# Patient Record
Sex: Male | Born: 1965 | Race: Black or African American | Hispanic: No | Marital: Married | Smoking: Never smoker
Health system: Southern US, Community
[De-identification: ages and names within clinical notes are randomized; demographics above are authoritative.]

## PROBLEM LIST (undated history)

## (undated) DIAGNOSIS — N189 Chronic kidney disease, unspecified: Secondary | ICD-10-CM

## (undated) DIAGNOSIS — I493 Ventricular premature depolarization: Secondary | ICD-10-CM

## (undated) DIAGNOSIS — M109 Gout, unspecified: Secondary | ICD-10-CM

## (undated) DIAGNOSIS — D649 Anemia, unspecified: Secondary | ICD-10-CM

## (undated) DIAGNOSIS — M1990 Unspecified osteoarthritis, unspecified site: Secondary | ICD-10-CM

## (undated) DIAGNOSIS — E213 Hyperparathyroidism, unspecified: Secondary | ICD-10-CM

## (undated) DIAGNOSIS — E119 Type 2 diabetes mellitus without complications: Secondary | ICD-10-CM

## (undated) DIAGNOSIS — Z862 Personal history of diseases of the blood and blood-forming organs and certain disorders involving the immune mechanism: Secondary | ICD-10-CM

## (undated) DIAGNOSIS — E669 Obesity, unspecified: Secondary | ICD-10-CM

## (undated) DIAGNOSIS — R0981 Nasal congestion: Secondary | ICD-10-CM

## (undated) DIAGNOSIS — I10 Essential (primary) hypertension: Secondary | ICD-10-CM

## (undated) DIAGNOSIS — I509 Heart failure, unspecified: Secondary | ICD-10-CM

## (undated) DIAGNOSIS — G4730 Sleep apnea, unspecified: Secondary | ICD-10-CM

## (undated) HISTORY — DX: Hyperparathyroidism, unspecified: E21.3

## (undated) HISTORY — DX: Chronic kidney disease, unspecified: N18.9

## (undated) HISTORY — DX: Ventricular premature depolarization: I49.3

## (undated) HISTORY — DX: Personal history of diseases of the blood and blood-forming organs and certain disorders involving the immune mechanism: Z86.2

## (undated) HISTORY — PX: NASAL SEPTUM SURGERY: SHX37

---

## 2006-06-15 ENCOUNTER — Encounter: Admission: RE | Admit: 2006-06-15 | Discharge: 2006-06-15 | Payer: Self-pay | Admitting: Cardiology

## 2006-07-13 ENCOUNTER — Ambulatory Visit (HOSPITAL_BASED_OUTPATIENT_CLINIC_OR_DEPARTMENT_OTHER): Admission: RE | Admit: 2006-07-13 | Discharge: 2006-07-13 | Payer: Self-pay | Admitting: Urology

## 2009-05-15 ENCOUNTER — Emergency Department (HOSPITAL_COMMUNITY): Admission: EM | Admit: 2009-05-15 | Discharge: 2009-05-15 | Payer: Self-pay | Admitting: Emergency Medicine

## 2009-10-17 ENCOUNTER — Encounter: Admission: RE | Admit: 2009-10-17 | Discharge: 2009-10-17 | Payer: Self-pay | Admitting: Cardiology

## 2010-01-22 ENCOUNTER — Encounter
Admission: RE | Admit: 2010-01-22 | Discharge: 2010-01-22 | Payer: Self-pay | Source: Home / Self Care | Attending: Cardiology | Admitting: Cardiology

## 2010-03-08 ENCOUNTER — Encounter: Admission: RE | Admit: 2010-03-08 | Discharge: 2010-03-08 | Payer: Self-pay | Admitting: Internal Medicine

## 2010-09-13 NOTE — Op Note (Signed)
NAMEMALACAI, OLM NO.:  [id number]   MEDICAL RECORD NO.:  [id number]          PATIENT TYPE:  AMB   LOCATION:  [facility]                         FACILITY:  [facility]   PHYSICIAN:  Bernestine Amass, M.D.  DATE OF BIRTH:  10/31/1965   DATE OF PROCEDURE:  07/13/2006  DATE OF DISCHARGE:                               OPERATIVE REPORT   PREOPERATIVE DIAGNOSIS:  Mild phimosis with chronic foreskin irritation.   POSTOPERATIVE DIAGNOSIS:  Mild phimosis with chronic foreskin  irritation.   PROCEDURE:  Circumcision.   SURGEON:  Bernestine Amass, M.D.   ANESTHESIA:  General with penile block.   INDICATIONS:  The patient was a 45 year old male who came in complaining  of intermittent problems with the foreskin tearing, discomfort and  chronic inflammation on his foreskin.  Clinically he had mild phimosis.  He did not have any active balanitis.  The patient reported that he  continued to have intermittent problems with his foreskin.  This was a  source of some chronic irritation for him during intercourse, and  requested circumcision.  The patient appeared to understand the  advantages and disadvantages, as well as the potential complications.  This included, but was not limited to, possible infection, penile  numbness after the surgery.  Full informed consent was obtained.   TECHNIQUES AND FINDINGS:  The patient was brought to the operating room,  where he had successful induction of general anesthesia.  We initiated a  Marcaine penile block to help with postoperative pain relief, and also  to limit anesthetic requirements.  We made a circumferential incision  around the glans penis.  The foreskin was then reduced and a separate  circumferential incision was made around the mucosal collar.  The  patient did have a somewhat tight little frenular band, and also a  little redundant skin right near the frenulum; this was excised.  Hemostasis was quite good.  To reconstruct the  penis we used interrupted  4-0 Vicryl suture.  At the completion of the procedure, a little  bacitracin ointment was placed circumferentially and a Tegaderm dressing  was used loosely around the ventral aspect of the incision.  The patient  appeared to tolerate the procedure well.  There were no obvious  complications or difficulties.  He was brought to the recovery room in  stable condition.           ______________________________  Bernestine Amass, M.D.  Electronically Signed     DSG/MEDQ  D:  07/13/2006  T:  07/13/2006  Job:  [id number]

## 2010-11-15 ENCOUNTER — Ambulatory Visit (HOSPITAL_COMMUNITY)
Admission: RE | Admit: 2010-11-15 | Discharge: 2010-11-15 | Disposition: A | Discharge status: Home / Self Care | Payer: Managed Care, Other (non HMO) | Source: Ambulatory Visit | Attending: Otolaryngology | Admitting: Otolaryngology

## 2010-11-15 ENCOUNTER — Encounter (HOSPITAL_COMMUNITY)
Admission: RE | Admit: 2010-11-15 | Discharge: 2010-11-15 | Disposition: A | Discharge status: Home / Self Care | Payer: Managed Care, Other (non HMO) | Source: Ambulatory Visit | Attending: Otolaryngology | Admitting: Otolaryngology

## 2010-11-15 ENCOUNTER — Other Ambulatory Visit (HOSPITAL_COMMUNITY): Payer: Self-pay | Admitting: Otolaryngology

## 2010-11-15 DIAGNOSIS — J342 Deviated nasal septum: Secondary | ICD-10-CM

## 2010-11-15 DIAGNOSIS — Z01812 Encounter for preprocedural laboratory examination: Secondary | ICD-10-CM | POA: Insufficient documentation

## 2010-11-15 DIAGNOSIS — Z01810 Encounter for preprocedural cardiovascular examination: Secondary | ICD-10-CM | POA: Insufficient documentation

## 2010-11-15 DIAGNOSIS — Z01811 Encounter for preprocedural respiratory examination: Secondary | ICD-10-CM | POA: Insufficient documentation

## 2010-11-15 LAB — BASIC METABOLIC PANEL
BUN: 25 mg/dL — ABNORMAL HIGH (ref 6–23)
Creatinine, Ser: 1.61 mg/dL — ABNORMAL HIGH (ref 0.50–1.35)
GFR calc Af Amer: 56 mL/min — ABNORMAL LOW (ref >60)
GFR calc non Af Amer: 47 mL/min — ABNORMAL LOW (ref >60)

## 2010-11-15 LAB — CBC
HCT: 42.1 % (ref 39.0–52.0)
MCHC: 34.9 g/dL (ref 30.0–36.0)
MCV: 82.9 fL (ref 78.0–100.0)
RDW: 12.8 % (ref 11.5–15.5)

## 2010-11-20 ENCOUNTER — Ambulatory Visit (HOSPITAL_COMMUNITY)
Admission: RE | Admit: 2010-11-20 | Discharge: 2010-11-21 | Disposition: A | Discharge status: Home / Self Care | Payer: Managed Care, Other (non HMO) | Source: Ambulatory Visit | Attending: Otolaryngology | Admitting: Otolaryngology

## 2010-11-20 DIAGNOSIS — Z01812 Encounter for preprocedural laboratory examination: Secondary | ICD-10-CM | POA: Insufficient documentation

## 2010-11-20 DIAGNOSIS — Z01818 Encounter for other preprocedural examination: Secondary | ICD-10-CM | POA: Insufficient documentation

## 2010-11-20 DIAGNOSIS — J342 Deviated nasal septum: Secondary | ICD-10-CM | POA: Insufficient documentation

## 2010-11-20 DIAGNOSIS — J343 Hypertrophy of nasal turbinates: Secondary | ICD-10-CM | POA: Insufficient documentation

## 2010-11-20 DIAGNOSIS — I10 Essential (primary) hypertension: Secondary | ICD-10-CM | POA: Insufficient documentation

## 2010-11-20 DIAGNOSIS — Z01810 Encounter for preprocedural cardiovascular examination: Secondary | ICD-10-CM | POA: Insufficient documentation

## 2010-11-20 DIAGNOSIS — G4733 Obstructive sleep apnea (adult) (pediatric): Secondary | ICD-10-CM | POA: Insufficient documentation

## 2010-11-20 LAB — GLUCOSE, CAPILLARY
Glucose-Capillary: 300 mg/dL — ABNORMAL HIGH (ref 70–99)
Glucose-Capillary: 248 mg/dL — ABNORMAL HIGH (ref 70–99)
Glucose-Capillary: 250 mg/dL — ABNORMAL HIGH (ref 70–99)

## 2011-01-17 NOTE — Op Note (Signed)
[name], [name] NO.:  [id number]  MEDICAL RECORD NO.:  [id number]  LOCATION:  2550                         FACILITY:  [facility]  PHYSICIAN:  Early Chars. Wilburn Cornelia, M.D.DATE OF BIRTH:  July 22, 1965  DATE OF PROCEDURE:  11/20/2010 DATE OF DISCHARGE:                              OPERATIVE REPORT   PREOPERATIVE DIAGNOSES: 1. Deviated nasal septum with airway obstruction. 2. Inferior turbinate hypertrophy. 3. Obstructive sleep apnea.  POSTOPERATIVE DIAGNOSES: 1. Deviated nasal septum with airway obstruction. 2. Inferior turbinate hypertrophy. 3. Obstructive sleep apnea.  INDICATIONS FOR SURGERY: 1. Deviated nasal septum with airway obstruction. 2. Inferior turbinate hypertrophy. 3. Obstructive sleep apnea.  SURGICAL PROCEDURES: 1. Nasal septoplasty. 2. Bilateral inferior turbinate reduction.  ANESTHESIA:  General endotracheal.  SURGEON:  Early Chars. Wilburn Cornelia, MD  COMPLICATIONS:  There were no complications.  BLOOD LOSS:  Approximately 50 mL.  The patient was transferred to the operating room to the recovery room in stable condition.  BRIEF HISTORY:  The patient is a 45 year old black male referred to our office for evaluation of progressive symptoms of nasal airway obstruction.  He has a history of obstructive sleep apnea and wears CPAP on a nightly basis, but had noted increasing difficulties using the CPAP device because of nasal blockage.  He had been treated with topical nasal steroids and antihistamines and despite appropriate medical therapy was unable to adequately breathe through his nose.  Examination in the office showed a significantly deviated nasal septum and severe turbinate hypertrophy.  Given his history, examination, and physical findings, I recommended nasal septoplasty and bilateral inferior turbinate reduction.  The risks, benefits, and possible complications of the procedure were discussed in detail with the patient who  understood and concurred with our plan for surgery which is scheduled an as outpatient under general anesthesia at [facility] with a history of obstructive sleep apnea.  The patient was admitted with plan for overnight observation in the Step-Down Unit for exacerbation of sleep apnea.  PROCEDURE:  The patient was brought to the operating room and placed in the supine position on November 20, 2010.  General endotracheal anesthesia was established without difficulty.  When the patient was adequately anesthetized, he was positioned on the operating table and prepped and draped in a sterile fashion.  His nose was then injected with a total of 8 mL of 1% lidocaine with 1:100,000 solution epinephrine which was injected in the submucosal fashion along the nasal septum and bilateral inferior turbinates.  The patient's nose then packed with Afrin-soaked cottonoid pledgets and left in place for approximately 10 minutes to allow for vasoconstriction and hemostasis.  The patient was positioned and prepped and draped.  Procedure was begun by creating a left anterior hemitransfixion incision and it was carried through the mucosa and underlying submucosa, and a mucoperichondrial flap was elevated on the left-hand side of the septum.  The cartilaginous septum was crossed and a mucoperichondrial flap was elevated on the right-hand side.  This exposed mid and posterior septal cartilage, which was severely deviated. Midseptal cartilage was removed, morselized, and later returned to the mucoperichondrial pocket.  Posterior septal cartilage and bone were mobilized and resected  including a large left inferior septal bony spur. The morselized cartilage returned to the mucoperichondrial pocket and the mucosal flaps were reapproximated with a 4-0 gut suture on a Keith needle in horizontal mattress fashion.  At the conclusion of the surgical procedure, bilateral Doyle nasal septal splints were  placed after the application of Bactroban ointment and were sutured position with a 3-0 Ethilon suture.  Attention was then turned to the inferior turbinates where bilateral inferior turbinate reduction was performed with cautery set at 12 watts. Two submucosal passes were made in each inferior turbinate.  When the turbinates had been adequately cauterized, anterior incisions were created overlying soft tissue elevated and a small amount of turbinate bone resected.  The turbinates were then outfractured to create a more patent nasal cavity.  The patient's nasal cavity was irrigated and suctioned.  Orogastric tube was passed and stomach contents were aspirated.  The patient was then awakened from the anesthetic.  He was extubated and transferred from the operating room to the recovery room in stable condition.  There were no complications and blood loss was approximately 50 mL.          ______________________________ Early Chars. Wilburn Cornelia, M.D.     DLS/MEDQ  D:  [id number]  T:  11/20/2010  Job:  [id number]  cc:   Ardyth Gal. Spruill, M.D.  Electronically Signed by Jerrell Belfast M.D. on 01/17/2011 12:25:44 PM

## 2011-02-12 ENCOUNTER — Ambulatory Visit (HOSPITAL_BASED_OUTPATIENT_CLINIC_OR_DEPARTMENT_OTHER): Payer: Managed Care, Other (non HMO) | Attending: Otolaryngology

## 2011-02-12 DIAGNOSIS — G4733 Obstructive sleep apnea (adult) (pediatric): Secondary | ICD-10-CM | POA: Insufficient documentation

## 2011-02-15 DIAGNOSIS — G4733 Obstructive sleep apnea (adult) (pediatric): Secondary | ICD-10-CM

## 2011-02-15 NOTE — Procedures (Signed)
[name], [name] NO.:  [id number]  MEDICAL RECORD NO.:  [id number]          PATIENT TYPE:  OUT  LOCATION:  SLEEP CENTER                 FACILITY:  [facility]  PHYSICIAN:  Clinton D. Annamaria Boots, MD, FCCP, FACPDATE OF BIRTH:  09/22/1965  DATE OF STUDY:  02/12/2011                           NOCTURNAL POLYSOMNOGRAM  REFERRING PHYSICIAN:  Shanon Brow L. Wilburn Cornelia, M.D.  REFERRING PHYSICIAN:  Early Chars. Wilburn Cornelia, MD  INDICATION FOR STUDY:  Hypersomnia with sleep apnea.  EPWORTH SLEEPINESS SCORE:  11/24.  BMI 41.8, weight 275 pounds, height 68 inches, neck 16.5 inches.  MEDICATIONS:  Home medications are charted and reviewed.  SLEEP ARCHITECTURE:  Split study protocol.  During the diagnostic phase, total sleep time 127 minutes with sleep efficiency 73.4%.  Stage I was 9.1%, stage II, 89.8%, stage III absent.  REM 1.2% of total sleep time. Sleep latency 18.5 minutes.  REM latency 71.5 minutes.  Awake after sleep onset 26 minutes.  Arousal index 33.5.  Bedtime Medication:  None.  RESPIRATORY DATA:  Split study protocol apnea hypotony index (AHI) 73.7 per hour.  A total of 156 events was scored including 34 obstructive apneas, 122 hypopneas.  Events were not positional.  REM AHI 80 per hour.  CPAP was titrated to 17 CWP, AHI 17.1 per hour.  He was then switched to bilevel (BiPAP) and titrated to an inspiratory pressure of 21 CWP and expiratory pressure of 17 CWP, for an AHI of 0 per hour.  He wore a medium ResMed Mirage Quattro full-face mask with heated humidifier and C-Flex setting of 3.  OXYGEN DATA:  Before CPAP, snoring was loud with oxygen desaturation to a nadir of 72% on room air.  With CPAP/BiPAP titration, mean oxygen saturation held 94.8% on room air, and snoring was prevented.  CARDIAC DATA:  Sinus rhythm with rare PAC.  MOVEMENT-PARASOMNIA:  No significant movement disturbance.  Bathroom x1.  IMPRESSIONS-RECOMMENDATIONS: 1. Severe obstructive sleep  apnea/hypopnea syndrome, AHI 73.7 per hour     with non-positional events, loud snoring, and oxygen desaturation     to a nadir of 72% on room air. 2. Successful titration to a final recommended pressure, bi-level,     inspiratory 21 CWP, expiratory 17 CWP, AHI 0 per hour.  CPAP     titration had not achieved controlled at 17 CWP, with a residual     AHI of 17 per hour.  He was     changed to bilevel to attain tolerable control at higher pressures     and this maneuver was successful.  He wore a medium ResMed Mirage     Quattro full-face mask with heated humidifier and C-Flex setting of     3.     Clinton D. Annamaria Boots, MD, [facility], [place], Tax adviser of Sleep Medicine Electronically Signed    CDY/MEDQ  D:  02/15/2011 10:20:39  T:  02/15/2011 10:34:30  Job:  [id number]

## 2011-11-12 ENCOUNTER — Other Ambulatory Visit: Payer: Self-pay | Admitting: Cardiology

## 2012-05-03 ENCOUNTER — Emergency Department (HOSPITAL_COMMUNITY): Payer: Managed Care, Other (non HMO)

## 2012-05-03 ENCOUNTER — Encounter (HOSPITAL_COMMUNITY): Payer: Self-pay | Admitting: Emergency Medicine

## 2012-05-03 ENCOUNTER — Emergency Department (HOSPITAL_COMMUNITY)
Admission: EM | Admit: 2012-05-03 | Discharge: 2012-05-03 | Disposition: A | Discharge status: Home / Self Care | Payer: Managed Care, Other (non HMO) | Attending: Emergency Medicine | Admitting: Emergency Medicine

## 2012-05-03 DIAGNOSIS — Y929 Unspecified place or not applicable: Secondary | ICD-10-CM | POA: Insufficient documentation

## 2012-05-03 DIAGNOSIS — Y939 Activity, unspecified: Secondary | ICD-10-CM | POA: Insufficient documentation

## 2012-05-03 DIAGNOSIS — IMO0002 Reserved for concepts with insufficient information to code with codable children: Secondary | ICD-10-CM

## 2012-05-03 DIAGNOSIS — I10 Essential (primary) hypertension: Secondary | ICD-10-CM | POA: Insufficient documentation

## 2012-05-03 DIAGNOSIS — S93409A Sprain of unspecified ligament of unspecified ankle, initial encounter: Secondary | ICD-10-CM | POA: Insufficient documentation

## 2012-05-03 DIAGNOSIS — W010XXA Fall on same level from slipping, tripping and stumbling without subsequent striking against object, initial encounter: Secondary | ICD-10-CM | POA: Insufficient documentation

## 2012-05-03 HISTORY — DX: Essential (primary) hypertension: I10

## 2012-05-03 MED ORDER — IBUPROFEN 800 MG PO TABS
800.0000 mg | ORAL_TABLET | Freq: Once | ORAL | Status: AC
  Administered 2012-05-03: 800 mg via ORAL
  Filled 2012-05-03: qty 1

## 2012-05-03 NOTE — Progress Notes (Signed)
Orthopedic Tech Progress Note Patient Details:  [name] February 08, 1966 [id number]  Ortho Devices Type of Ortho Device: Crutches;Knee Sleeve   Katheren Shams 05/03/2012, 6:58 AM

## 2012-05-03 NOTE — ED Notes (Signed)
Pt from home states he tripped over a bag last night, was caught, c/o burning in right leg above the knee w/ movement. Denies sob/cp, no signs of distress

## 2012-05-03 NOTE — ED Provider Notes (Signed)
Medical screening examination/treatment/procedure(s) were performed by non-physician practitioner and as supervising physician I was immediately available for consultation/collaboration.   Ezequiel Essex, MD 05/03/12 [phone]

## 2012-05-03 NOTE — ED Provider Notes (Signed)
History     CSN: [id number]  Arrival date & time 05/03/12  0546   First MD Initiated Contact with Patient 05/03/12 0600      Chief Complaint  Patient presents with  . Leg Pain    (Consider location/radiation/quality/duration/timing/severity/associated sxs/prior treatment) HPI Comments: 47 year old male presents emergency department with his wife complaining of right knee and upper leg pain after tripping over a bowling bag last night. Describes the pain as burning, radiating up his quad worse with bending his knee rated 6/10. Keeping his knee straight alleviates the pain. Denies swelling or bruising. No numbness or tingling down his leg.  Patient is a 47 y.o. male presenting with leg pain. The history is provided by the patient and the spouse.  Leg Pain  Pertinent negatives include no numbness.    Past Medical History  Diagnosis Date  . Hypertension     Past Surgical History  Procedure Date  . Nasal septum surgery     No family history on file.  History  Substance Use Topics  . Smoking status: Never Smoker   . Smokeless tobacco: Never Used  . Alcohol Use: Yes     Comment: Social      Review of Systems  Constitutional: Negative.   Musculoskeletal: Negative for joint swelling.       Positive for right knee pain.  Skin: Negative for color change.  Neurological: Negative for numbness.    Allergies  Review of patient's allergies indicates no known allergies.  Home Medications  No current outpatient prescriptions on file.  BP 154/102  Pulse 72  Temp 98.2 F (36.8 C) (Oral)  Resp 12  SpO2 98%  Physical Exam  Nursing note and vitals reviewed. Constitutional: He is oriented to person, place, and time. He appears well-developed. No distress.       Obese  HENT:  Head: Normocephalic and atraumatic.  Mouth/Throat: Oropharynx is clear and moist.  Eyes: Conjunctivae normal and EOM are normal.  Neck: Normal range of motion. Neck supple.  Cardiovascular: Normal  rate, regular rhythm, normal heart sounds and intact distal pulses.   Pulmonary/Chest: Effort normal and breath sounds normal.  Musculoskeletal:       Right knee: He exhibits normal range of motion, no swelling and no ecchymosis. tenderness found.       Legs: Neurological: He is alert and oriented to person, place, and time. He has normal strength. No sensory deficit. Gait normal.  Skin: Skin is warm and dry. No bruising and no ecchymosis noted.  Psychiatric: He has a normal mood and affect. His behavior is normal.    ED Course  Procedures (including critical care time)  Labs Reviewed - No data to display Dg Knee Complete 4 Views Right  05/03/2012  *RADIOLOGY REPORT*  Clinical Data: Fall.  Anterior knee pain.  RIGHT KNEE - COMPLETE 4+ VIEW  Comparison: None.  Findings: Imaged bones, joints and soft tissues appear normal.  IMPRESSION: Negative exam.   Original Report Authenticated By: Orlean Patten, M.D.      1. Knee sprain and strain       MDM  47 y/o male with knee sprain/strain. Knee sleeve and crutches given. RICE discussed. Advised ibuprofen every 6 hours. If no improvement he will f/u with PCP. Return precautions discussed. Patient states understanding of plan and is agreeable.         Illene Labrador, PA-C 05/03/12 [phone]

## 2012-05-12 ENCOUNTER — Encounter (HOSPITAL_COMMUNITY): Payer: Self-pay | Admitting: *Deleted

## 2012-05-13 ENCOUNTER — Observation Stay (HOSPITAL_COMMUNITY)
Admission: RE | Admit: 2012-05-13 | Discharge: 2012-05-14 | Disposition: A | Discharge status: Home / Self Care | Payer: Managed Care, Other (non HMO) | Source: Ambulatory Visit | Attending: Orthopedic Surgery | Admitting: Orthopedic Surgery

## 2012-05-13 ENCOUNTER — Encounter (HOSPITAL_COMMUNITY)
Admission: RE | Disposition: A | Discharge status: Home / Self Care | Payer: Self-pay | Source: Ambulatory Visit | Attending: Orthopedic Surgery

## 2012-05-13 ENCOUNTER — Ambulatory Visit (HOSPITAL_COMMUNITY): Payer: Managed Care, Other (non HMO) | Admitting: Certified Registered Nurse Anesthetist

## 2012-05-13 ENCOUNTER — Ambulatory Visit (HOSPITAL_COMMUNITY): Payer: Managed Care, Other (non HMO)

## 2012-05-13 ENCOUNTER — Encounter (HOSPITAL_COMMUNITY): Payer: Self-pay | Admitting: Certified Registered Nurse Anesthetist

## 2012-05-13 ENCOUNTER — Encounter (HOSPITAL_COMMUNITY): Payer: Self-pay

## 2012-05-13 DIAGNOSIS — Y92838 Other recreation area as the place of occurrence of the external cause: Secondary | ICD-10-CM | POA: Insufficient documentation

## 2012-05-13 DIAGNOSIS — S76119A Strain of unspecified quadriceps muscle, fascia and tendon, initial encounter: Secondary | ICD-10-CM

## 2012-05-13 DIAGNOSIS — Y998 Other external cause status: Secondary | ICD-10-CM | POA: Insufficient documentation

## 2012-05-13 DIAGNOSIS — N189 Chronic kidney disease, unspecified: Secondary | ICD-10-CM

## 2012-05-13 DIAGNOSIS — G4730 Sleep apnea, unspecified: Secondary | ICD-10-CM | POA: Insufficient documentation

## 2012-05-13 DIAGNOSIS — I129 Hypertensive chronic kidney disease with stage 1 through stage 4 chronic kidney disease, or unspecified chronic kidney disease: Secondary | ICD-10-CM | POA: Insufficient documentation

## 2012-05-13 DIAGNOSIS — I160 Hypertensive urgency: Secondary | ICD-10-CM

## 2012-05-13 DIAGNOSIS — E119 Type 2 diabetes mellitus without complications: Secondary | ICD-10-CM | POA: Insufficient documentation

## 2012-05-13 DIAGNOSIS — Y9354 Activity, bowling: Secondary | ICD-10-CM | POA: Insufficient documentation

## 2012-05-13 DIAGNOSIS — Z6841 Body Mass Index (BMI) 40.0 and over, adult: Secondary | ICD-10-CM | POA: Insufficient documentation

## 2012-05-13 DIAGNOSIS — X500XXA Overexertion from strenuous movement or load, initial encounter: Secondary | ICD-10-CM | POA: Insufficient documentation

## 2012-05-13 DIAGNOSIS — I10 Essential (primary) hypertension: Secondary | ICD-10-CM

## 2012-05-13 DIAGNOSIS — IMO0002 Reserved for concepts with insufficient information to code with codable children: Principal | ICD-10-CM | POA: Insufficient documentation

## 2012-05-13 DIAGNOSIS — N183 Chronic kidney disease, stage 3 unspecified: Secondary | ICD-10-CM | POA: Insufficient documentation

## 2012-05-13 DIAGNOSIS — Y9239 Other specified sports and athletic area as the place of occurrence of the external cause: Secondary | ICD-10-CM | POA: Insufficient documentation

## 2012-05-13 HISTORY — DX: Type 2 diabetes mellitus without complications: E11.9

## 2012-05-13 HISTORY — DX: Sleep apnea, unspecified: G47.30

## 2012-05-13 HISTORY — DX: Nasal congestion: R09.81

## 2012-05-13 HISTORY — PX: QUADRICEPS TENDON REPAIR: SHX756

## 2012-05-13 LAB — CBC
HCT: 40.7 % (ref 39.0–52.0)
Hemoglobin: 13.7 g/dL (ref 13.0–17.0)
MCH: 29.1 pg (ref 26.0–34.0)
MCV: 86.6 fL (ref 78.0–100.0)
Platelets: 262 10*3/uL (ref 150–400)
RBC: 4.7 MIL/uL (ref 4.22–5.81)
RDW: 13.4 % (ref 11.5–15.5)
WBC: 4.6 10*3/uL (ref 4.0–10.5)

## 2012-05-13 LAB — BASIC METABOLIC PANEL
Calcium: 9.6 mg/dL (ref 8.4–10.5)
Chloride: 102 mEq/L (ref 96–112)
Creatinine, Ser: 1.66 mg/dL — ABNORMAL HIGH (ref 0.50–1.35)
GFR calc Af Amer: 55 mL/min — ABNORMAL LOW (ref >90)

## 2012-05-13 LAB — SURGICAL PCR SCREEN
MRSA, PCR: NEGATIVE
Staphylococcus aureus: NEGATIVE

## 2012-05-13 LAB — GLUCOSE, CAPILLARY
Glucose-Capillary: 143 mg/dL — ABNORMAL HIGH (ref 70–99)
Glucose-Capillary: 154 mg/dL — ABNORMAL HIGH (ref 70–99)

## 2012-05-13 LAB — CREATININE, SERUM: Creatinine, Ser: 1.66 mg/dL — ABNORMAL HIGH (ref 0.50–1.35)

## 2012-05-13 SURGERY — REPAIR, TENDON, QUADRICEPS
Anesthesia: General | Site: Knee | Laterality: Right | Wound class: Clean

## 2012-05-13 MED ORDER — HYDROMORPHONE HCL PF 1 MG/ML IJ SOLN
1.0000 mg | INTRAMUSCULAR | Status: DC | PRN
  Administered 2012-05-13 – 2012-05-14 (×3): 1 mg via INTRAVENOUS
  Filled 2012-05-13 (×3): qty 1

## 2012-05-13 MED ORDER — FENTANYL CITRATE 0.05 MG/ML IJ SOLN
INTRAMUSCULAR | Status: AC
  Administered 2012-05-13: 100 ug
  Filled 2012-05-13: qty 2

## 2012-05-13 MED ORDER — 0.9 % SODIUM CHLORIDE (POUR BTL) OPTIME
TOPICAL | Status: DC | PRN
  Administered 2012-05-13: 1000 mL

## 2012-05-13 MED ORDER — NEBIVOLOL HCL 10 MG PO TABS
10.0000 mg | ORAL_TABLET | Freq: Two times a day (BID) | ORAL | Status: DC
  Administered 2012-05-13 – 2012-05-14 (×2): 10 mg via ORAL
  Filled 2012-05-13 (×4): qty 1

## 2012-05-13 MED ORDER — METHOCARBAMOL 500 MG PO TABS
500.0000 mg | ORAL_TABLET | Freq: Four times a day (QID) | ORAL | Status: DC | PRN
  Administered 2012-05-13 – 2012-05-14 (×2): 500 mg via ORAL
  Filled 2012-05-13 (×2): qty 1

## 2012-05-13 MED ORDER — HYDROMORPHONE HCL PF 1 MG/ML IJ SOLN
INTRAMUSCULAR | Status: AC
  Filled 2012-05-13: qty 1

## 2012-05-13 MED ORDER — OXYCODONE-ACETAMINOPHEN 5-325 MG PO TABS: 1.0000 | ORAL_TABLET | ORAL | Status: DC | PRN

## 2012-05-13 MED ORDER — OXYCODONE HCL 5 MG PO TABS
5.0000 mg | ORAL_TABLET | ORAL | Status: DC | PRN
  Administered 2012-05-13 – 2012-05-14 (×5): 10 mg via ORAL
  Filled 2012-05-13 (×5): qty 2

## 2012-05-13 MED ORDER — SUCCINYLCHOLINE CHLORIDE 20 MG/ML IJ SOLN
INTRAMUSCULAR | Status: DC | PRN
  Administered 2012-05-13: 100 mg via INTRAVENOUS

## 2012-05-13 MED ORDER — HYDRALAZINE HCL 20 MG/ML IJ SOLN
INTRAMUSCULAR | Status: AC
  Filled 2012-05-13: qty 1

## 2012-05-13 MED ORDER — SODIUM CHLORIDE 0.9 % IV SOLN
INTRAVENOUS | Status: DC
  Administered 2012-05-13: 20 mL/h via INTRAVENOUS

## 2012-05-13 MED ORDER — LACTATED RINGERS IV SOLN
INTRAVENOUS | Status: DC
  Administered 2012-05-13: 50 mL/h via INTRAVENOUS

## 2012-05-13 MED ORDER — LISINOPRIL 40 MG PO TABS
40.0000 mg | ORAL_TABLET | Freq: Two times a day (BID) | ORAL | Status: DC
Start: 2012-05-14 — End: 2012-05-14
  Filled 2012-05-13 (×2): qty 1

## 2012-05-13 MED ORDER — METHOCARBAMOL 100 MG/ML IJ SOLN: 500.0000 mg | Freq: Four times a day (QID) | INTRAVENOUS | Status: DC | PRN

## 2012-05-13 MED ORDER — METOCLOPRAMIDE HCL 10 MG PO TABS: 5.0000 mg | ORAL_TABLET | Freq: Three times a day (TID) | ORAL | Status: DC | PRN

## 2012-05-13 MED ORDER — ACETAMINOPHEN 325 MG PO TABS: 650.0000 mg | ORAL_TABLET | Freq: Four times a day (QID) | ORAL | Status: DC | PRN

## 2012-05-13 MED ORDER — HYDROMORPHONE HCL PF 1 MG/ML IJ SOLN
0.2500 mg | INTRAMUSCULAR | Status: DC | PRN
  Administered 2012-05-13 (×4): 0.5 mg via INTRAVENOUS

## 2012-05-13 MED ORDER — METOCLOPRAMIDE HCL 5 MG/ML IJ SOLN: 5.0000 mg | Freq: Three times a day (TID) | INTRAMUSCULAR | Status: DC | PRN

## 2012-05-13 MED ORDER — LISINOPRIL 40 MG PO TABS
40.0000 mg | ORAL_TABLET | Freq: Two times a day (BID) | ORAL | Status: DC
  Filled 2012-05-13: qty 1

## 2012-05-13 MED ORDER — PROPOFOL 10 MG/ML IV BOLUS
INTRAVENOUS | Status: DC | PRN
  Administered 2012-05-13: 200 mg via INTRAVENOUS
  Administered 2012-05-13: 100 mg via INTRAVENOUS

## 2012-05-13 MED ORDER — HYDRALAZINE HCL 20 MG/ML IJ SOLN
10.0000 mg | Freq: Once | INTRAMUSCULAR | Status: AC
  Administered 2012-05-13: 10 mg via INTRAVENOUS

## 2012-05-13 MED ORDER — LISINOPRIL 40 MG PO TABS
40.0000 mg | ORAL_TABLET | ORAL | Status: AC
  Administered 2012-05-13: 40 mg via ORAL
  Filled 2012-05-13 (×2): qty 1

## 2012-05-13 MED ORDER — PROMETHAZINE HCL 25 MG/ML IJ SOLN
6.2500 mg | INTRAMUSCULAR | Status: DC | PRN
  Administered 2012-05-13: 6.25 mg via INTRAVENOUS

## 2012-05-13 MED ORDER — CHLORHEXIDINE GLUCONATE 4 % EX LIQD: 60.0000 mL | Freq: Once | CUTANEOUS | Status: DC

## 2012-05-13 MED ORDER — LIDOCAINE HCL (CARDIAC) 20 MG/ML IV SOLN
INTRAVENOUS | Status: DC | PRN
  Administered 2012-05-13: 80 mg via INTRAVENOUS

## 2012-05-13 MED ORDER — OXYCODONE HCL 5 MG/5ML PO SOLN: 5.0000 mg | Freq: Once | ORAL | Status: DC | PRN

## 2012-05-13 MED ORDER — DEXTROSE 5 % IV SOLN
3.0000 g | Freq: Four times a day (QID) | INTRAVENOUS | Status: AC
  Administered 2012-05-13 – 2012-05-14 (×2): 3 g via INTRAVENOUS
  Filled 2012-05-13 (×2): qty 3000

## 2012-05-13 MED ORDER — HYDRALAZINE HCL 20 MG/ML IJ SOLN
10.0000 mg | Freq: Four times a day (QID) | INTRAMUSCULAR | Status: DC | PRN
  Administered 2012-05-14: 10 mg via INTRAVENOUS
  Filled 2012-05-13 (×2): qty 0.5

## 2012-05-13 MED ORDER — BUPIVACAINE-EPINEPHRINE 0.25% -1:200000 IJ SOLN
INTRAMUSCULAR | Status: DC | PRN
  Administered 2012-05-13: 10 mL

## 2012-05-13 MED ORDER — PHENOL 1.4 % MT LIQD: 1.0000 | OROMUCOSAL | Status: DC | PRN

## 2012-05-13 MED ORDER — METHOCARBAMOL 500 MG PO TABS: 500.0000 mg | ORAL_TABLET | Freq: Three times a day (TID) | ORAL | Status: DC | PRN

## 2012-05-13 MED ORDER — CEFAZOLIN SODIUM-DEXTROSE 2-3 GM-% IV SOLR: 2.0000 g | INTRAVENOUS | Status: DC

## 2012-05-13 MED ORDER — OXYCODONE HCL 5 MG PO TABS: 5.0000 mg | ORAL_TABLET | Freq: Once | ORAL | Status: DC | PRN

## 2012-05-13 MED ORDER — LACTATED RINGERS IV SOLN
INTRAVENOUS | Status: DC | PRN
  Administered 2012-05-13 (×2): via INTRAVENOUS

## 2012-05-13 MED ORDER — AMLODIPINE BESYLATE 10 MG PO TABS
10.0000 mg | ORAL_TABLET | Freq: Every day | ORAL | Status: DC
  Filled 2012-05-13: qty 1

## 2012-05-13 MED ORDER — LISINOPRIL 40 MG PO TABS: 40.0000 mg | ORAL_TABLET | Freq: Two times a day (BID) | ORAL | Status: DC

## 2012-05-13 MED ORDER — AMLODIPINE BESYLATE 10 MG PO TABS
10.0000 mg | ORAL_TABLET | Freq: Every day | ORAL | Status: DC
  Administered 2012-05-13 – 2012-05-14 (×2): 10 mg via ORAL
  Filled 2012-05-13 (×3): qty 1

## 2012-05-13 MED ORDER — ONDANSETRON HCL 4 MG/2ML IJ SOLN: 4.0000 mg | Freq: Four times a day (QID) | INTRAMUSCULAR | Status: DC | PRN

## 2012-05-13 MED ORDER — MIDAZOLAM HCL 2 MG/2ML IJ SOLN
INTRAMUSCULAR | Status: AC
  Administered 2012-05-13: 1 mg
  Filled 2012-05-13: qty 2

## 2012-05-13 MED ORDER — INSULIN ASPART 100 UNIT/ML ~~LOC~~ SOLN
0.0000 [IU] | Freq: Three times a day (TID) | SUBCUTANEOUS | Status: DC
  Administered 2012-05-14: 2 [IU] via SUBCUTANEOUS

## 2012-05-13 MED ORDER — MEPERIDINE HCL 25 MG/ML IJ SOLN: 6.2500 mg | INTRAMUSCULAR | Status: DC | PRN

## 2012-05-13 MED ORDER — MENTHOL 3 MG MT LOZG: 1.0000 | LOZENGE | OROMUCOSAL | Status: DC | PRN

## 2012-05-13 MED ORDER — BUPIVACAINE-EPINEPHRINE 0.25% -1:200000 IJ SOLN
INTRAMUSCULAR | Status: AC
  Filled 2012-05-13: qty 1

## 2012-05-13 MED ORDER — PROMETHAZINE HCL 25 MG/ML IJ SOLN
INTRAMUSCULAR | Status: AC
  Filled 2012-05-13: qty 1

## 2012-05-13 MED ORDER — ONDANSETRON HCL 4 MG PO TABS: 4.0000 mg | ORAL_TABLET | Freq: Four times a day (QID) | ORAL | Status: DC | PRN

## 2012-05-13 MED ORDER — BUPIVACAINE-EPINEPHRINE PF 0.5-1:200000 % IJ SOLN
INTRAMUSCULAR | Status: DC | PRN
  Administered 2012-05-13: 30 mL

## 2012-05-13 MED ORDER — ACETAMINOPHEN 650 MG RE SUPP: 650.0000 mg | Freq: Four times a day (QID) | RECTAL | Status: DC | PRN

## 2012-05-13 MED ORDER — MUPIROCIN 2 % EX OINT
TOPICAL_OINTMENT | Freq: Two times a day (BID) | CUTANEOUS | Status: DC
  Filled 2012-05-13: qty 22

## 2012-05-13 MED ORDER — AMLODIPINE BESYLATE 10 MG PO TABS: 10.0000 mg | ORAL_TABLET | Freq: Every day | ORAL | Status: DC

## 2012-05-13 MED ORDER — FENTANYL CITRATE 0.05 MG/ML IJ SOLN
INTRAMUSCULAR | Status: DC | PRN
  Administered 2012-05-13 (×2): 25 ug via INTRAVENOUS
  Administered 2012-05-13: 50 ug via INTRAVENOUS
  Administered 2012-05-13: 100 ug via INTRAVENOUS

## 2012-05-13 MED ORDER — ONDANSETRON HCL 4 MG/2ML IJ SOLN
INTRAMUSCULAR | Status: DC | PRN
  Administered 2012-05-13: 4 mg via INTRAVENOUS

## 2012-05-13 MED ORDER — HYDROCHLOROTHIAZIDE 25 MG PO TABS
25.0000 mg | ORAL_TABLET | Freq: Every day | ORAL | Status: DC
  Administered 2012-05-13 – 2012-05-14 (×2): 25 mg via ORAL
  Filled 2012-05-13 (×3): qty 1

## 2012-05-13 MED ORDER — ENOXAPARIN SODIUM 30 MG/0.3ML ~~LOC~~ SOLN
30.0000 mg | Freq: Two times a day (BID) | SUBCUTANEOUS | Status: DC
  Administered 2012-05-14: 30 mg via SUBCUTANEOUS
  Filled 2012-05-13 (×4): qty 0.3

## 2012-05-13 MED ORDER — CEFAZOLIN SODIUM-DEXTROSE 2-3 GM-% IV SOLR
INTRAVENOUS | Status: AC
  Administered 2012-05-13: 2 g via INTRAVENOUS
  Filled 2012-05-13: qty 50

## 2012-05-13 MED ORDER — MUPIROCIN 2 % EX OINT
TOPICAL_OINTMENT | CUTANEOUS | Status: AC
  Filled 2012-05-13: qty 22

## 2012-05-13 MED ORDER — GLIMEPIRIDE 4 MG PO TABS
4.0000 mg | ORAL_TABLET | Freq: Every day | ORAL | Status: DC
  Administered 2012-05-14: 4 mg via ORAL
  Filled 2012-05-13 (×3): qty 1

## 2012-05-13 MED ORDER — HYDROCHLOROTHIAZIDE 25 MG PO TABS
25.0000 mg | ORAL_TABLET | Freq: Every day | ORAL | Status: DC
  Filled 2012-05-13: qty 1

## 2012-05-13 SURGICAL SUPPLY — 77 items
BANDAGE ELASTIC 4 VELCRO ST LF (GAUZE/BANDAGES/DRESSINGS) ×1 IMPLANT
BANDAGE ELASTIC 6 VELCRO ST LF (GAUZE/BANDAGES/DRESSINGS) ×1 IMPLANT
BANDAGE ESMARK 6X9 LF (GAUZE/BANDAGES/DRESSINGS) IMPLANT
BANDAGE GAUZE ELAST BULKY 4 IN (GAUZE/BANDAGES/DRESSINGS) ×4 IMPLANT
BIT DRILL GOLD JC 2.5X95 (BIT) ×1 IMPLANT
BLADE SURG 10 STRL SS (BLADE) ×3 IMPLANT
BLADE SURG 15 STRL LF DISP TIS (BLADE) ×1 IMPLANT
BLADE SURG 15 STRL SS (BLADE) ×2
BLADE SURG ROTATE 9660 (MISCELLANEOUS) IMPLANT
BNDG CMPR 9X6 STRL LF SNTH (GAUZE/BANDAGES/DRESSINGS) ×1
BNDG CMPR MED 10X6 ELC LF (GAUZE/BANDAGES/DRESSINGS) ×1
BNDG COHESIVE 4X5 TAN STRL (GAUZE/BANDAGES/DRESSINGS) ×1 IMPLANT
BNDG COHESIVE 6X5 TAN STRL LF (GAUZE/BANDAGES/DRESSINGS) ×1 IMPLANT
BNDG ELASTIC 6X10 VLCR STRL LF (GAUZE/BANDAGES/DRESSINGS) ×1 IMPLANT
BNDG ESMARK 6X9 LF (GAUZE/BANDAGES/DRESSINGS) ×2
CLEANER TIP ELECTROSURG 2X2 (MISCELLANEOUS) ×2 IMPLANT
CLOTH BEACON ORANGE TIMEOUT ST (SAFETY) ×2 IMPLANT
COVER MAYO STAND STRL (DRAPES) ×2 IMPLANT
COVER SURGICAL LIGHT HANDLE (MISCELLANEOUS) ×1 IMPLANT
CUFF TOURNIQUET SINGLE 34IN LL (TOURNIQUET CUFF) IMPLANT
CUFF TOURNIQUET SINGLE 44IN (TOURNIQUET CUFF) ×1 IMPLANT
DRAPE INCISE IOBAN 66X45 STRL (DRAPES) ×1 IMPLANT
DRAPE U-SHAPE 47X51 STRL (DRAPES) ×3 IMPLANT
DRSG ADAPTIC 3X8 NADH LF (GAUZE/BANDAGES/DRESSINGS) ×2 IMPLANT
DRSG PAD ABDOMINAL 8X10 ST (GAUZE/BANDAGES/DRESSINGS) ×6 IMPLANT
DURAPREP 26ML APPLICATOR (WOUND CARE) ×2 IMPLANT
ELECT NDL TIP 2.8 STRL (NEEDLE) ×1 IMPLANT
ELECT NEEDLE TIP 2.8 STRL (NEEDLE) ×4 IMPLANT
ELECT REM PT RETURN 9FT ADLT (ELECTROSURGICAL) ×2
ELECTRODE REM PT RTRN 9FT ADLT (ELECTROSURGICAL) ×1 IMPLANT
GLOVE BIO SURGEON STRL SZ7.5 (GLOVE) ×2 IMPLANT
GLOVE BIOGEL PI ORTHO PRO SZ8 (GLOVE) ×1
GLOVE ORTHO TXT STRL SZ7.5 (GLOVE) ×2 IMPLANT
GLOVE PI ORTHO PRO STRL SZ8 (GLOVE) ×1 IMPLANT
GLOVE SURG ORTHO 8.5 STRL (GLOVE) ×2 IMPLANT
GOWN STRL NON-REIN LRG LVL3 (GOWN DISPOSABLE) ×4 IMPLANT
GOWN STRL REIN XL XLG (GOWN DISPOSABLE) ×4 IMPLANT
IMMOBILIZER KNEE 24 THIGH 36 (MISCELLANEOUS) IMPLANT
IMMOBILIZER KNEE 24 UNIV (MISCELLANEOUS) ×2
KIT BASIN OR (CUSTOM PROCEDURE TRAY) ×2 IMPLANT
KIT ROOM TURNOVER OR (KITS) ×2 IMPLANT
MANIFOLD NEPTUNE II (INSTRUMENTS) ×2 IMPLANT
NDL 1/2 CIR MAYO (NEEDLE) IMPLANT
NEEDLE 1/2 CIR MAYO (NEEDLE) ×2 IMPLANT
NEEDLE 22X1 1/2 (OR ONLY) (NEEDLE) ×1 IMPLANT
NS IRRIG 1000ML POUR BTL (IV SOLUTION) ×2 IMPLANT
PACK ORTHO EXTREMITY (CUSTOM PROCEDURE TRAY) ×2 IMPLANT
PAD ARMBOARD 7.5X6 YLW CONV (MISCELLANEOUS) ×4 IMPLANT
PAD CAST 4YDX4 CTTN HI CHSV (CAST SUPPLIES) ×1 IMPLANT
PADDING CAST COTTON 4X4 STRL (CAST SUPPLIES)
PADDING CAST COTTON 6X4 STRL (CAST SUPPLIES) ×1 IMPLANT
PASSER SUT SWANSON 36MM LOOP (INSTRUMENTS) ×3 IMPLANT
PENCIL BUTTON HOLSTER BLD 10FT (ELECTRODE) ×1 IMPLANT
SPONGE GAUZE 4X4 12PLY (GAUZE/BANDAGES/DRESSINGS) ×2 IMPLANT
SPONGE LAP 18X18 X RAY DECT (DISPOSABLE) ×2 IMPLANT
STAPLER VISISTAT 35W (STAPLE) ×2 IMPLANT
STOCKINETTE IMPERVIOUS LG (DRAPES) ×2 IMPLANT
SUCTION FRAZIER TIP 10 FR DISP (SUCTIONS) ×2 IMPLANT
SUT ETHIBOND 2 OS 4 DA (SUTURE) IMPLANT
SUT FIBERWIRE #2 38 T-5 BLUE (SUTURE) ×10
SUT FIBERWIRE #5 38 CONV NDL (SUTURE) ×4
SUT VIC AB 0 CT1 27 (SUTURE) ×2
SUT VIC AB 0 CT1 27XBRD ANBCTR (SUTURE) ×2 IMPLANT
SUT VIC AB 1 CT1 27 (SUTURE) ×2
SUT VIC AB 1 CT1 27XBRD ANBCTR (SUTURE) ×2 IMPLANT
SUT VIC AB 2-0 CT1 27 (SUTURE) ×4
SUT VIC AB 2-0 CT1 TAPERPNT 27 (SUTURE) ×2 IMPLANT
SUTURE FIBERWR #2 38 T-5 BLUE (SUTURE) IMPLANT
SUTURE FIBERWR #5 38 CONV NDL (SUTURE) IMPLANT
SYR BULB 3OZ (MISCELLANEOUS) ×1 IMPLANT
SYR CONTROL 10ML LL (SYRINGE) ×1 IMPLANT
TOWEL OR 17X24 6PK STRL BLUE (TOWEL DISPOSABLE) ×2 IMPLANT
TOWEL OR 17X26 10 PK STRL BLUE (TOWEL DISPOSABLE) ×2 IMPLANT
TRAY FOLEY CATH 14FR (SET/KITS/TRAYS/PACK) IMPLANT
TUBE CONNECTING 12X1/4 (SUCTIONS) ×2 IMPLANT
WATER STERILE IRR 1000ML POUR (IV SOLUTION) ×2 IMPLANT
YANKAUER SUCT BULB TIP NO VENT (SUCTIONS) ×2 IMPLANT

## 2012-05-13 NOTE — Progress Notes (Signed)
Dr. Veverly Fells notified of elevated BP. Pain level controlled, sleeping intermittently. Admit for overnight observation per MD. See orders.

## 2012-05-13 NOTE — Transfer of Care (Signed)
Immediate Anesthesia Transfer of Care Note  Patient: [name]  Procedure(s) Performed: Procedure(s) (LRB) with comments: REPAIR QUADRICEP TENDON (Right)  Patient Location: PACU  Anesthesia Type:General  Level of Consciousness: awake, alert  and oriented  Airway & Oxygen Therapy: Patient Spontanous Breathing and Patient connected to face mask oxygen  Post-op Assessment: Report given to PACU RN, Post -op Vital signs reviewed and stable and Patient moving all extremities  Post vital signs: Reviewed and stable  Complications: No apparent anesthesia complications

## 2012-05-13 NOTE — Interval H&P Note (Signed)
History and Physical Interval Note:  05/13/2012 12:41 PM  [name]  has presented today for surgery, with the diagnosis of RIGHT QUADRICEP TEAR   The various methods of treatment have been discussed with the patient and family. After consideration of risks, benefits and other options for treatment, the patient has consented to  Procedure(s) (LRB) with comments: REPAIR QUADRICEP TENDON (Right) as a surgical intervention .  The patient's history has been reviewed, patient examined, no change in status, stable for surgery.  I have reviewed the patient's chart and labs.  Questions were answered to the patient's satisfaction.     Edson Deridder,STEVEN R

## 2012-05-13 NOTE — Anesthesia Preprocedure Evaluation (Addendum)
Anesthesia Evaluation  Patient identified by MRN, date of birth, ID band Patient awake    Reviewed: Allergy & Precautions, H&P , NPO status   History of Anesthesia Complications Negative for: history of anesthetic complications  Airway   Neck ROM: Full    Dental  (+) Teeth Intact   Pulmonary sleep apnea ,  breath sounds clear to auscultation        Cardiovascular hypertension, Rate:Normal     Neuro/Psych    GI/Hepatic negative GI ROS, Neg liver ROS,   Endo/Other  diabetesMorbid obesity  Renal/GU negative Renal ROS     Musculoskeletal   Abdominal (+) + obese,   Peds  Hematology negative hematology ROS (+)   Anesthesia Other Findings   Reproductive/Obstetrics                           Anesthesia Physical Anesthesia Plan  ASA: III  Anesthesia Plan: General   Post-op Pain Management:    Induction: Intravenous  Airway Management Planned: LMA  Additional Equipment:   Intra-op Plan:   Post-operative Plan:   Informed Consent: I have reviewed the patients History and Physical, chart, labs and discussed the procedure including the risks, benefits and alternatives for the proposed anesthesia with the patient or authorized representative who has indicated his/her understanding and acceptance.     Plan Discussed with: Surgeon and CRNA  Anesthesia Plan Comments:         Anesthesia Quick Evaluation

## 2012-05-13 NOTE — Anesthesia Procedure Notes (Addendum)
Anesthesia Regional Block:  Femoral nerve block  Pre-Anesthetic Checklist: ,, timeout performed, Correct Patient, Correct Site, Correct Laterality, Correct Procedure,, site marked, risks and benefits discussed, Surgical consent,  Pre-op evaluation,  At surgeon's request and post-op pain management   Prep: chloraprep       Needles:  Injection technique: Single-shot  Needle Type: Echogenic Stimulator Needle     Needle Length: 9cm  Needle Gauge: 21    Additional Needles:  Procedures: nerve stimulator Femoral nerve block  Nerve Stimulator or Paresthesia:  Response: Quadriceps muscle contraction, 0.45 mA,   Additional Responses:   Narrative:  Start time: 05/13/2012 12:19 PM End time: 05/13/2012 12:25 PM Injection made incrementally with aspirations every 5 mL.  Performed by: Personally  Anesthesiologist: t Massagee  Additional Notes: Functioning IV was confirmed and monitors were applied.  A 38mm 21ga Arrow echogenic stimulator needle was used. Sterile prep and drape,hand hygiene and sterile gloves were used.  Negative aspiration and negative test dose prior to incremental administration of local anesthetic. The patient tolerated the procedure well.    Femoral nerve block Procedure Name: Intubation Date/Time: 05/13/2012 1:25 PM Performed by: Carney Living Pre-anesthesia Checklist: Patient identified, Emergency Drugs available, Suction available, Patient being monitored and Timeout performed Patient Re-evaluated:Patient Re-evaluated prior to inductionOxygen Delivery Method: Circle system utilized Preoxygenation: Pre-oxygenation with 100% oxygen Intubation Type: IV induction Laryngoscope Size: Mac and 4 Grade View: Grade I Tube type: Oral Tube size: 7.5 mm Number of attempts: 1 Airway Equipment and Method: Stylet Placement Confirmation: ETT inserted through vocal cords under direct vision,  positive ETCO2 and breath sounds checked- equal and bilateral Secured at: 22  cm Tube secured with: Tape Dental Injury: Teeth and Oropharynx as per pre-operative assessment  Comments: Number 5 LMA inserted,  +ETC02, could not maintain pt 02 Sats with LMA. LMA removed and ETT easily placed, VSS

## 2012-05-13 NOTE — H&P (Signed)
CC: right knee pain/weakness HPI: 47 y/o male injured right knee while bowling about 2 weeks ago. C/o moderate pain and inability to walk or contract quad muscle without pain.  Denies any previous problems with the right knee in the past PMH: hypertension, diabetes Social: non smoker, occasional etoh, denies illicit drugs Allergies: NKDA Meds: motrin, amaryl, lisinopril, hydrodiuril, mobic, metformin, bystolic, exforge hct Family: hypertension ROS: inability to ambulate or contract quadriceps on right side due to pain PE: alert and appropriate 47 y/o male in no acute distress Right lower leg with slight deficit at anterior quad tendon Unable to contract right quad nv intact distally No joint line tenderness to the knee Moderate effusion No rashes distally Ultrasound: rupture of right quad tendon Assessment: rupture of right quad tendon Plan: open repair of right quad tendon

## 2012-05-13 NOTE — Brief Op Note (Signed)
05/13/2012  2:50 PM  PATIENT:  [name]  47 y.o. male  PRE-OPERATIVE DIAGNOSIS:  RIGHT QUADRICEP TEAR   POST-OPERATIVE DIAGNOSIS:  RIGHT QUADRICEP TEAR   PROCEDURE:  Procedure(s) (LRB) with comments: REPAIR QUADRICEP TENDON (Right)  SURGEON:  Surgeon(s) and Role:    * Augustin Schooling, MD - Primary  PHYSICIAN ASSISTANT:   ASSISTANTS: Ventura Bruns, PA-C   ANESTHESIA:   regional and general  EBL:  Total I/O In: 1000 [I.V.:1000] Out: -   BLOOD ADMINISTERED:none  DRAINS: none   LOCAL MEDICATIONS USED:  MARCAINE     SPECIMEN:  No Specimen  DISPOSITION OF SPECIMEN:  N/A  COUNTS:  YES  TOURNIQUET:   Total Tourniquet Time Documented: Thigh (Right) - 71 minutes  DICTATION: .Other Dictation: Dictation Number [id number]  PLAN OF CARE: Discharge to home after PACU  PATIENT DISPOSITION:  PACU - hemodynamically stable.   Delay start of Pharmacological VTE agent (>24hrs) due to surgical blood loss or risk of bleeding: not applicable

## 2012-05-13 NOTE — Consult Note (Signed)
[facility]  PATIENT DETAILS Name: [name] Age: 47 y.o. Sex: male Date of Birth: 09-Feb-1966 Admit Date: 05/13/2012 [id number] O, MD Requesting MD: Augustin Schooling, MD  Date of consultation: 05/13/12  REASON FOR CONSULTATION:   Uncontrolled Hypertension post surgery  Impression 1.Hypertensive Urgency- patient is on multiple antihypertensive medications as an outpatient, claims he is on Exforge, Bystolic, lisinopril and hydrochlorothiazide. He did not take any of his medications with the exception of Bystolic this morning. His uncontrolled hypertension is from him being in pain post surgery and also because he missed his medications. Per patient, his blood pressure usually runs in the 123456 systolic at baseline. 2.Diabetes 3. Chronic kidney disease stage III 4.Status post right quadriceps tear and subsequent repair  Recommendations 1. [id number] resume all of his home medications. IV hydralazine or IV labetalol as needed for a systolic blood pressure of more than 180 mmHg. He would need adequate pain control as well, for optimal blood pressure control. We will continue to follow and adjust medications accordingly 2. Diabetes: Place on SSI, resume metformin on discharge. Okay to continue Amaryl 3. CKD. stage III- monitor electrolytes periodically 4. Status post right quadriceps tear and repair - per primary service                   DVT Prophylaxis: - Defer to primary service   Code Status: full code  The hospitalist service with  followup again tomorrow. Please contact me if I can be of assistance in the meanwhile. Thank you for this consultation.  [facility] Pager [phone]  HPI: Patient is a 47 year old African American male with a long-standing history of hypertension, presumed stage III chronic kidney disease, diabetes who was brought in by orthopedics for repair of his right quadriceps tendon. Apparently approximately 2  weeks ago he fell while bowling and injured his right leg area, he was having pain and difficulty walking. He was subsequently diagnosed with the tip of the right quadriceps tendon, and brought to the hospital today for open repair. Postoperatively he was noted to have uncontrolled hypertension, I was subsequently asked to provide medical consultation to manage his blood pressure and other medical comorbidities. Per patient he does not have any prior history of CVA or CAD. He claims that he takes at least 4 antihypertensive medications at home, he claims that his blood pressure always runs in the 0000000 systolic range at baseline. He only took Bystolic earlier this morning, he hasn't taken the rest of his medications at all today. He currently denies any chest pain or shortness of breath. He denies any abdominal pain or nausea vomiting. He claims that he has 10/10 pain in his operative site.   ALLERGIES:  No Known Allergies  PAST MEDICAL HISTORY: Past Medical History  Diagnosis Date  . Hypertension   . Sinus congestion     all the time  . Diabetes mellitus without complication   . Sleep apnea     PAST SURGICAL HISTORY: Past Surgical History  Procedure Date  . Nasal septum surgery     MEDICATIONS AT HOME: Prior to Admission medications   Medication Sig Start Date End Date Taking? Authorizing Provider  glimepiride (AMARYL) 4 MG tablet Take 4 mg by mouth daily before breakfast.   Yes Historical Provider, MD  hydrochlorothiazide (HYDRODIURIL) 25 MG tablet Take 25 mg by mouth daily.   Yes Historical Provider, MD  lisinopril (PRINIVIL,ZESTRIL) 40 MG tablet Take 40 mg by mouth 2 (two) times daily.  Yes Historical Provider, MD  meloxicam (MOBIC) 15 MG tablet Take 15 mg by mouth daily.   Yes Historical Provider, MD  metFORMIN (GLUCOPHAGE) 1000 MG tablet Take 1,000 mg by mouth 2 (two) times daily with a meal.   Yes Historical Provider, MD  nebivolol (BYSTOLIC) 10 MG tablet Take 10 mg by mouth 2  (two) times daily.   Yes Historical Provider, MD  Phenylephrine-Acetaminophen (TYLENOL SINUS CONGESTION/PAIN PO) Take 1 tablet by mouth every 8 (eight) hours as needed. For congestion/headache   Yes Historical Provider, MD  methocarbamol (ROBAXIN) 500 MG tablet Take 1 tablet (500 mg total) by mouth 3 (three) times daily as needed. 05/13/12   Augustin Schooling, MD  oxyCODONE-acetaminophen (ROXICET) 5-325 MG per tablet Take 1-2 tablets by mouth every 4 (four) hours as needed for pain. 05/13/12   Augustin Schooling, MD    FAMILY HISTORY: Mother-hypertension and diabetes  SOCIAL HISTORY:  reports that he has never smoked. He has never used smokeless tobacco. He reports that he drinks about 3 ounces of alcohol per week. He reports that he does not use illicit drugs.  REVIEW OF SYSTEMS:  Constitutional:   No  weight loss, night sweats,  Fevers, chills, fatigue.  HEENT:    No headaches, Difficulty swallowing,Tooth/dental problems,Sore throat,  No sneezing, itching, ear ache, nasal congestion, post nasal drip,   Cardio-vascular: No chest pain,  Orthopnea, PND, swelling in lower extremities, anasarca,  dizziness, palpitations  GI:  No heartburn, indigestion, abdominal pain, nausea, vomiting, diarrhea, change in bowel habits, loss of appetite  Resp: No shortness of breath with exertion or at rest.  No excess mucus, no productive cough, No non-productive cough,  No coughing up of blood.No change in color of mucus.No wheezing.No chest wall deformity  Skin:  no rash or lesions.  GU:  no dysuria, change in color of urine, no urgency or frequency.  No flank pain.  Musculoskeletal: No joint pain or swelling.  No decreased range of motion.  No back pain.  Psych: No change in mood or affect. No depression or anxiety.  No memory loss.   PHYSICAL EXAM: Blood pressure 210/96, pulse 58, temperature 98.4 F (36.9 C), temperature source Oral, resp. rate 19, height 5\' 8"  (1.727 m), weight 126.4 kg (278  lb 10.6 oz), SpO2 97.00%.  General appearance :Awake, alert, not in any distress. Speech Clear. Not toxic Looking HEENT: Atraumatic and Normocephalic, pupils equally reactive to light and accomodation Neck: supple, no JVD. No cervical lymphadenopathy.  Chest:Good air entry bilaterally, no added sounds  CVS: S1 S2 regular, no murmurs.  Abdomen: Bowel sounds present, Non tender and not distended with no gaurding, rigidity or rebound. Extremities: B/L Lower Ext shows no edema, both legs are warm to touch, with  dorsalis pedis pulses palpable. Neurology: Awake alert, and oriented X 3, CN II-XII intact, Non focal, Deep Tendon Reflex-2+ all over, plantar's downgoing B/L, sensory exam is grossly intact.  Skin:No Rash Wounds:N/A  LABS ON ADMISSION:   Basename 05/13/12 1128  NA 138  K 4.4  CL 102  CO2 26  GLUCOSE 149*  BUN 24*  CREATININE 1.66*  CALCIUM 9.6  MG --  PHOS --   No results found for this basename: AST:2,ALT:2,ALKPHOS:2,BILITOT:2,PROT:2,ALBUMIN:2 in the last 72 hours No results found for this basename: LIPASE:2,AMYLASE:2 in the last 72 hours  Basename 05/13/12 1128  WBC 4.6  NEUTROABS --  HGB 13.8  HCT 39.6  MCV 86.7  PLT 262   No results found for  this basename: CKTOTAL:3,CKMB:3,CKMBINDEX:3,TROPONINI:3 in the last 72 hours No results found for this basename: DDIMER:2 in the last 72 hours No components found with this basename: POCBNP:3   RADIOLOGIC STUDIES ON ADMISSION: Dg Chest 2 View  05/13/2012  *RADIOLOGY REPORT*  Clinical Data: Tendon repair.  Hypertension.  CHEST - 2 VIEW  Comparison: Two-view chest 11/15/2010.  Findings: The heart size is normal.  The lungs are clear.  The visualized soft tissues and bony thorax are unremarkable.  IMPRESSION: Negative chest.   Original Report Authenticated By: San Morelle, M.D.    Dg Knee Complete 4 Views Right  05/03/2012  *RADIOLOGY REPORT*  Clinical Data: Fall.  Anterior knee pain.  RIGHT KNEE - COMPLETE 4+ VIEW   Comparison: None.  Findings: Imaged bones, joints and soft tissues appear normal.  IMPRESSION: Negative exam.   Original Report Authenticated By: Orlean Patten, M.D.      Total time spent 45 minutes.  [facility] Pager [phone]  If 7PM-7AM, please contact night-coverage [email or web address] Password [email or web address] 05/13/2012, 6:15 PM

## 2012-05-13 NOTE — Anesthesia Postprocedure Evaluation (Signed)
  Anesthesia Post-op Note  Patient: [name]  Procedure(s) Performed: Procedure(s) (LRB) with comments: REPAIR QUADRICEP TENDON (Right)  Patient Location: PACU  Anesthesia Type:General  Level of Consciousness: awake and alert   Airway and Oxygen Therapy: Patient Spontanous Breathing  Post-op Pain: mild  Post-op Assessment: Post-op Vital signs reviewed, Patient's Cardiovascular Status Stable, Respiratory Function Stable, Patent Airway and No signs of Nausea or vomiting  Post-op Vital Signs: Reviewed and stable  Complications: No apparent anesthesia complications

## 2012-05-14 ENCOUNTER — Encounter (HOSPITAL_COMMUNITY): Payer: Self-pay | Admitting: Orthopedic Surgery

## 2012-05-14 LAB — GLUCOSE, CAPILLARY: Glucose-Capillary: 162 mg/dL — ABNORMAL HIGH (ref 70–99)

## 2012-05-14 LAB — POCT I-STAT 4, (NA,K, GLUC, HGB,HCT)
Glucose, Bld: 147 mg/dL — ABNORMAL HIGH (ref 70–99)
Potassium: 4.1 mEq/L (ref 3.5–5.1)
Sodium: 140 mEq/L (ref 135–145)

## 2012-05-14 MED ORDER — LISINOPRIL 40 MG PO TABS
40.0000 mg | ORAL_TABLET | Freq: Two times a day (BID) | ORAL | Status: DC
  Administered 2012-05-14: 40 mg via ORAL
  Filled 2012-05-14 (×3): qty 1

## 2012-05-14 MED ORDER — IRBESARTAN 300 MG PO TABS
300.0000 mg | ORAL_TABLET | Freq: Every day | ORAL | Status: DC
  Administered 2012-05-14: 300 mg via ORAL
  Filled 2012-05-14: qty 1

## 2012-05-14 MED ORDER — METHOCARBAMOL 500 MG PO TABS: 500.0000 mg | ORAL_TABLET | Freq: Four times a day (QID) | ORAL | Status: DC | PRN

## 2012-05-14 MED ORDER — OXYCODONE HCL 5 MG PO TABS: 5.0000 mg | ORAL_TABLET | ORAL | Status: DC | PRN

## 2012-05-14 MED FILL — Mupirocin Oint 2%: CUTANEOUS | Qty: 22 | Status: AC

## 2012-05-14 NOTE — Progress Notes (Signed)
Orthopedics Progress Note  Subjective: Pt c/o mild to moderate pain in the right knee today but otherwise doing fine Blood pressures have normalized for him, normally 123456 systolic  Objective:  Filed Vitals:   05/14/12 0655  BP: 171/94  Pulse: 100  Temp: 98.4 F (36.9 C)  Resp: 20    General: Awake and alert  Musculoskeletal: right leg dressing and knee immobilizer intact Neurovascularly intact  Lab Results  Component Value Date   WBC 5.9 05/13/2012   HGB 13.7 05/13/2012   HCT 40.7 05/13/2012   MCV 86.6 05/13/2012   PLT 248 05/13/2012       Component Value Date/Time   NA 140 05/13/2012 1307   K 4.1 05/13/2012 1307   CL 102 05/13/2012 1128   CO2 26 05/13/2012 1128   GLUCOSE 147* 05/13/2012 1307   BUN 24* 05/13/2012 1128   CREATININE 1.66* 05/13/2012 1928   CALCIUM 9.6 05/13/2012 1128   GFRNONAA 48* 05/13/2012 1928   GFRAA 55* 05/13/2012 1928    No results found for this basename: INR, PROTIME    Assessment/Plan: POD #1 s/p Procedure(s):right  REPAIR QUADRICEP TENDON with elevated blood pressure after surgery  D/c home F/u in 2 weeks Continue with blood pressure checks  Doran Heater. Veverly Fells, MD 05/14/2012 7:57 AM

## 2012-05-14 NOTE — Progress Notes (Signed)
Utilization review completed. Ocean Kearley, RN, BSN. 

## 2012-05-14 NOTE — Op Note (Signed)
[name], QUIST NO.:  [id number]  MEDICAL RECORD NO.:  [id number]  LOCATION:  [id number]                        FACILITY:  [place]  PHYSICIAN:  Doran Heater. Veverly Fells, M.D. DATE OF BIRTH:  01-24-1966  DATE OF PROCEDURE:  05/13/2012 DATE OF DISCHARGE:                              OPERATIVE REPORT   PREOPERATIVE DIAGNOSIS:  Complete right quadriceps tendon tear.  POSTOPERATIVE DIAGNOSIS:  Complete right quadriceps tendon tear.  PROCEDURE PERFORMED:  Open repair of quadriceps tendon using a #5 FiberWire through drill holes.  ATTENDING SURGEON:  Doran Heater. Veverly Fells, M.D.  ASSISTANT:  Abbott Pao. Dixon, P.A. was scrubbed during the majority of the surgery through the passing of sutures and then scrubbed out following that portion.  ANESTHESIA:  General anesthesia was used plus femoral block.  ESTIMATED BLOOD LOSS:  Minimal.  FLUID REPLACEMENT:  1200 mL crystalloid.  TOURNIQUET TIME:  1 hour 10 minutes at 350 mmHg.  INSTRUMENT COUNTS:  Correct.  COMPLICATIONS:  There were no complications.  Perioperative antibiotics were given.  INDICATIONS:  The patient is a 47 year old male who suffered a right complete quadriceps tendon tear after a fall while out a bowling alley. The patient was unable to extend his knee and unable walk without the use of crutches following this injury.  Initially seen in urgent care, told he had a knee sprain, eventually came in to Orthopedics couple days ago where he was diagnosed with a complete quad tendon tear and brought now to surgery to repair his extensor mechanism.  Informed consent was obtained.  DESCRIPTION OF PROCEDURE:  After adequate level of anesthesia was achieved, the patient was positioned in the supine position.  Right leg correctly identified.  Time-out called.  Nonsterile tourniquet was placed on proximal thigh.  Right leg was sterilely prepped and draped in usual manner.  The leg was elevated and exsanguinated using  Esmarch bandage.  Tourniquet was elevated to 350 mmHg.  Longitudinal midline incision was created starting proximal to the patella and tissues __________ tendon.  We removed the remaining tendon from the end of the proximal pole of patella.  We identified extensive tearing of the retinaculum medially and laterally.  We thoroughly irrigated the knee. We then went ahead and used a locked whip stitches in a baseball fashion going up into the quad tendon #5 FiberWire x2.  We brought the sutures down through the inferior portion of the quadriceps tendon and then, we put this through 3 drill holes, which were longitudinal through the patella.  We identified those sutures distally and then crossed this under the patellar tendon and snapped this with hemostats for tying at the end.  We then used #2 FiberWire in the gutters to do the retinacular repair interrupted suture.  We also utilized some tendon that was still natively attached to the patella and then was on the very deep surface. We sutured that layer of tendon to the quadriceps tendon as we brought that down in position.  With all those sutures, we see thoroughly irrigated.  We then went ahead and tied the sutures with the knee in hyperextension.  We had a nice anatomic repair with no gapping with some  gentle range of motion.  We then irrigated again and repaired the subcu with layered 2-0 Vicryl closure followed by staples.  Sterile compressive bandage and knee immobilizer applied.  The patient tolerated the surgery well.     Doran Heater. Veverly Fells, M.D.     SRN/MEDQ  D:  05/13/2012  T:  05/13/2012  Job:  [id number]

## 2012-05-14 NOTE — Plan of Care (Signed)
Problem: Diagnosis - Type of Surgery Goal: General Surgical Patient Education (See Patient Education module for education specifics) Right tendon repair

## 2012-05-14 NOTE — Progress Notes (Signed)
Care Management note 05/14/12  Patient needed Bariatric crutches. Order and demographics faxed to [facility]. They will order and deliver to patient's home early next week. CM informed Peter-PT of this. (Note able to be entered in [place] ). Ricki Miller, RN BSN Case Manager

## 2012-05-14 NOTE — Evaluation (Signed)
Physical Therapy Evaluation Patient Details Name: [name] MRN: [id number] DOB: 10-01-1965 Today's Date: 05/14/2012 Time: [id number] PT Time Calculation (min): 21 min  PT Assessment / Plan / Recommendation Clinical Impression  Pt demonstrates safety and modified independence with mobility. Pt currently has standard set of crutches. Pt weighs 280lbs and the weight limit for non-wt bearing on standard crutches is 250lbs. Pt will require barriatric crutches for safety with mobility. No further acute PT needs.     PT Assessment  Patent does not need any further PT services    Follow Up Recommendations  Outpatient PT    Does the patient have the potential to tolerate intense rehabilitation      Barriers to Discharge        Equipment Recommendations  Other (comment) (Barriatric axillary crutches.  )    Recommendations for Other Services     Frequency      Precautions / Restrictions Precautions Precautions: None Restrictions Weight Bearing Restrictions: Yes RLE Weight Bearing: Non weight bearing   Pertinent Vitals/Pain Pt c/o 8/10 pain in R knee.  Medicated approx 1 hour prior to PT.        Mobility  Bed Mobility Bed Mobility: Supine to Sit Supine to Sit: 6: Modified independent (Device/Increase time) Details for Bed Mobility Assistance: pt able to use L LE to lift R LE.   Transfers Transfers: Sit to Stand;Stand to Sit Sit to Stand: 6: Modified independent (Device/Increase time);From bed;From chair/3-in-1;With upper extremity assist Stand to Sit: 6: Modified independent (Device/Increase time);To chair/3-in-1;With upper extremity assist Details for Transfer Assistance: pt demonstrate safety with crutches.   Ambulation/Gait Ambulation/Gait Assistance: 6: Modified independent (Device/Increase time) Ambulation Distance (Feet): 50 Feet Assistive device: Crutches Ambulation/Gait Assistance Details: Instructed pt in NWB on R LE  swing to/through technique Gait Pattern:  Step-through pattern;Step-to pattern Stairs: Yes Stairs Assistance: 5: Supervision Stairs Assistance Details (indicate cue type and reason): instructed pt in 1 crutch 1 rail technique.   Stair Management Technique: One rail Left;With crutches;Forwards Number of Stairs: 5  Wheelchair Mobility Wheelchair Mobility: No    Shoulder Instructions     Exercises     PT Diagnosis:    PT Problem List:   PT Treatment Interventions:     PT Goals Acute Rehab PT Goals PT Goal Formulation: With patient  Visit Information  Last PT Received On: 05/14/12    Subjective Data  Subjective: Agree with PT eval   Prior Functioning  Home Living Lives With: Spouse Available Help at Discharge: Family;Available 24 hours/day Type of Home: House Home Access: Stairs to enter [employer] of Steps: 12 Entrance Stairs-Rails: Right;Left;Can reach both Home Layout: One level Bathroom Accessibility: Yes How Accessible: Accessible via walker Prior Function Level of Independence: Independent    Cognition  Overall Cognitive Status: Appears within functional limits for tasks assessed/performed Arousal/Alertness: Awake/alert Orientation Level: Appears intact for tasks assessed Behavior During Session: [facility] for tasks performed    Extremity/Trunk Assessment Right Upper Extremity Assessment RUE ROM/Strength/Tone: Within functional levels Left Upper Extremity Assessment LUE ROM/Strength/Tone: Within functional levels Right Lower Extremity Assessment RLE ROM/Strength/Tone: Unable to fully assess;Due to precautions Left Lower Extremity Assessment LLE ROM/Strength/Tone: Within functional levels   Balance    End of Session PT - End of Session Equipment Utilized During Treatment: Gait belt Activity Tolerance: Patient tolerated treatment well Patient left: in chair;with call bell/phone within reach;with family/visitor present Nurse Communication: Mobility status;Weight bearing status  GP Functional  Assessment Tool Used: clinical judgement. Functional Limitation: Mobility: Walking  and moving around Mobility: Walking and Moving Around Current Status [phone]): At least 1 percent but less than 20 percent impaired, limited or restricted Mobility: Walking and Moving Around Goal Status [phone]): At least 1 percent but less than 20 percent impaired, limited or restricted Mobility: Walking and Moving Around Discharge Status [phone]): At least 1 percent but less than 20 percent impaired, limited or restricted   Lavere Stork 05/14/2012, 1:21 PM  Nicklaus Alviar L. Dianey Suchy DPT [phone]

## 2012-05-14 NOTE — Discharge Summary (Signed)
Physician Discharge Summary  Patient ID: [name] MRN: [id number] DOB/AGE: 1966-01-03 47 y.o.  Admit date: 05/13/2012 Discharge date: 05/14/2012  Admission Diagnoses:  Right quad tendon rupture  Discharge Diagnoses:  1. Right quad tendon rupture with repair 2. Hypertensive crisis s/p surgery   Surgeries: Procedure(s): REPAIR QUADRICEP TENDON on 05/13/2012   Consultants: Internal medicine  Discharged Condition: Stable  Hospital Course: [name] is an 47 y.o. male who was admitted 05/13/2012 with a chief complaint of No chief complaint on file. , and found to have a diagnosis of <principal problem not specified>.  They were brought to the operating room on 05/13/2012 and underwent the above named procedures.    The patient had an uncomplicated hospital course and was stable for discharge.  Recent vital signs:  Filed Vitals:   05/14/12 0655  BP: 171/94  Pulse: 100  Temp: 98.4 F (36.9 C)  Resp: 20    Recent laboratory studies:  Results for orders placed during the hospital encounter of 05/13/12  GLUCOSE, CAPILLARY      Component Value Range   Glucose-Capillary 146 (*) 70 - 99 mg/dL  SURGICAL PCR SCREEN      Component Value Range   MRSA, PCR NEGATIVE  NEGATIVE   Staphylococcus aureus NEGATIVE  NEGATIVE  BASIC METABOLIC PANEL      Component Value Range   Sodium 138  135 - 145 mEq/L   Potassium 4.4  3.5 - 5.1 mEq/L   Chloride 102  96 - 112 mEq/L   CO2 26  19 - 32 mEq/L   Glucose, Bld 149 (*) 70 - 99 mg/dL   BUN 24 (*) 6 - 23 mg/dL   Creatinine, Ser 1.66 (*) 0.50 - 1.35 mg/dL   Calcium 9.6  8.4 - 10.5 mg/dL   GFR calc non Af Amer 48 (*) >90 mL/min   GFR calc Af Amer 55 (*) >90 mL/min  CBC      Component Value Range   WBC 4.6  4.0 - 10.5 K/uL   RBC 4.57  4.22 - 5.81 MIL/uL   Hemoglobin 13.8  13.0 - 17.0 g/dL   HCT 39.6  39.0 - 52.0 %   MCV 86.7  78.0 - 100.0 fL   MCH 30.2  26.0 - 34.0 pg   MCHC 34.8  30.0 - 36.0 g/dL   RDW 13.4  11.5 - 15.5 %   Platelets 262  150 - 400 K/uL  GLUCOSE, CAPILLARY      Component Value Range   Glucose-Capillary 136 (*) 70 - 99 mg/dL  GLUCOSE, CAPILLARY      Component Value Range   Glucose-Capillary 143 (*) 70 - 99 mg/dL  CBC      Component Value Range   WBC 5.9  4.0 - 10.5 K/uL   RBC 4.70  4.22 - 5.81 MIL/uL   Hemoglobin 13.7  13.0 - 17.0 g/dL   HCT 40.7  39.0 - 52.0 %   MCV 86.6  78.0 - 100.0 fL   MCH 29.1  26.0 - 34.0 pg   MCHC 33.7  30.0 - 36.0 g/dL   RDW 13.4  11.5 - 15.5 %   Platelets 248  150 - 400 K/uL  CREATININE, SERUM      Component Value Range   Creatinine, Ser 1.66 (*) 0.50 - 1.35 mg/dL   GFR calc non Af Amer 48 (*) >90 mL/min   GFR calc Af Amer 55 (*) >90 mL/min  GLUCOSE, CAPILLARY  Component Value Range   Glucose-Capillary 154 (*) 70 - 99 mg/dL  GLUCOSE, CAPILLARY      Component Value Range   Glucose-Capillary 162 (*) 70 - 99 mg/dL  POCT I-STAT 4, (NA,K, GLUC, HGB,HCT)      Component Value Range   Sodium 140  135 - 145 mEq/L   Potassium 4.1  3.5 - 5.1 mEq/L   Glucose, Bld 147 (*) 70 - 99 mg/dL   HCT 41.0  39.0 - 52.0 %   Hemoglobin 13.9  13.0 - 17.0 g/dL    Discharge Medications:     Medication List     As of 05/14/2012  8:01 AM    STOP taking these medications         ibuprofen 200 MG tablet   Commonly known as: ADVIL,MOTRIN      TAKE these medications         amLODipine-valsartan 10-320 MG per tablet   Commonly known as: EXFORGE   Take 1 tablet by mouth daily.      glimepiride 4 MG tablet   Commonly known as: AMARYL   Take 4 mg by mouth daily before breakfast.      hydrochlorothiazide 25 MG tablet   Commonly known as: HYDRODIURIL   Take 25 mg by mouth daily.      lisinopril 40 MG tablet   Commonly known as: PRINIVIL,ZESTRIL   Take 40 mg by mouth 2 (two) times daily.      meloxicam 15 MG tablet   Commonly known as: MOBIC   Take 15 mg by mouth daily.      metFORMIN 1000 MG tablet   Commonly known as: GLUCOPHAGE   Take 1,000 mg by mouth 2  (two) times daily with a meal.      methocarbamol 500 MG tablet   Commonly known as: ROBAXIN   Take 1 tablet (500 mg total) by mouth 3 (three) times daily as needed.      methocarbamol 500 MG tablet   Commonly known as: ROBAXIN   Take 1 tablet (500 mg total) by mouth every 6 (six) hours as needed.      nebivolol 10 MG tablet   Commonly known as: BYSTOLIC   Take 10 mg by mouth 2 (two) times daily.      oxyCODONE 5 MG immediate release tablet   Commonly known as: Oxy IR/ROXICODONE   Take 1-2 tablets (5-10 mg total) by mouth every 3 (three) hours as needed.      oxyCODONE-acetaminophen 5-325 MG per tablet   Commonly known as: PERCOCET/ROXICET   Take 1-2 tablets by mouth every 4 (four) hours as needed for pain.      TYLENOL SINUS CONGESTION/PAIN PO   Take 1 tablet by mouth every 8 (eight) hours as needed. For congestion/headache        Diagnostic Studies: Dg Chest 2 View  05/13/2012  *RADIOLOGY REPORT*  Clinical Data: Tendon repair.  Hypertension.  CHEST - 2 VIEW  Comparison: Two-view chest 11/15/2010.  Findings: The heart size is normal.  The lungs are clear.  The visualized soft tissues and bony thorax are unremarkable.  IMPRESSION: Negative chest.   Original Report Authenticated By: San Morelle, M.D.    Dg Knee Complete 4 Views Right  05/03/2012  *RADIOLOGY REPORT*  Clinical Data: Fall.  Anterior knee pain.  RIGHT KNEE - COMPLETE 4+ VIEW  Comparison: None.  Findings: Imaged bones, joints and soft tissues appear normal.  IMPRESSION: Negative exam.   Original Report Authenticated By:  Orlean Patten, M.D.     Disposition: 01-Home or Self Care      Discharge Orders    Future Orders Please Complete By Expires   Diet - low sodium heart healthy      Diet - low sodium heart healthy      Call MD / Call 911      Comments:   If you experience chest pain or shortness of breath, CALL 911 and be transported to the hospital emergency room.  If you develope a fever above 101 F,  pus (white drainage) or increased drainage or redness at the wound, or calf pain, call your surgeon's office.   Constipation Prevention      Comments:   Drink plenty of fluids.  Prune juice may be helpful.  You may use a stool softener, such as Colace (over the counter) 100 mg twice a day.  Use MiraLax (over the counter) for constipation as needed.   Increase activity slowly as tolerated      Discharge instructions      Comments:   Must wear brace and use crutches at all times. Keep incision clean and dry Follow up in two weeks [facility]  [phone]   Driving restrictions      Comments:   No driving for 4 weeks   Lifting restrictions      Comments:   No lifting for 12 weeks   Nursing communication      Scheduling Instructions:   Please provide 4x4s and paper tape for dressing change Dressing changes to start tomorrow at home thanks   Call MD / Call 911      Comments:   If you experience chest pain or shortness of breath, CALL 911 and be transported to the hospital emergency room.  If you develope a fever above 101 F, pus (white drainage) or increased drainage or redness at the wound, or calf pain, call your surgeon's office.   Constipation Prevention      Comments:   Drink plenty of fluids.  Prune juice may be helpful.  You may use a stool softener, such as Colace (over the counter) 100 mg twice a day.  Use MiraLax (over the counter) for constipation as needed.   Increase activity slowly as tolerated         Follow-up Information    Follow up with NORRIS,STEVEN R, MD. In 2 weeks. [phone])    Contact information:   [address], [address] [place], [address] [place] [address] [id number]           Signed: Ventura Bruns 05/14/2012, 8:01 AM

## 2012-05-14 NOTE — Progress Notes (Signed)
RT Note: pt set up on CPAP 10cmh20 with a nasal mask. Pt stated he would place himself on when he was ready.

## 2012-05-14 NOTE — Progress Notes (Signed)
PATIENT DETAILS Name: [name] Age: 47 y.o. Sex: male Date of Birth: August 03, 1965 Admit Date: 05/13/2012 Admitting Physician Augustin Schooling, MD [id number] O, MD  Subjective: Blood pressure still elevated, but much better than yesterday, still in significant pain in his operative site.  Assessment/Plan: 1. HTN: Continue all of his home medications- now on amlodipine, HCTZ, Avapro, lisinopril and Bsystolic. If the patient is still here, we'll continue to titrate these medications. Please note Avapro was just added today. He will need close followup with his outpatient physicians. 2. Diabetes: CBGs controlled. Resume metformin and Amaryl on discharge. 3. CKD. stage III- monitor electrolytes periodically 4. Status post right quadriceps tear and repair - per primary service   Disposition: Remain inpatient- discharge home at the discretion of primary service. He will need close followup with his outpatient physician.  PHYSICAL EXAM: Vital signs in last 24 hours: Filed Vitals:   05/14/12 0422 05/14/12 0621 05/14/12 0655 05/14/12 1010  BP: 165/101 182/90 171/94 181/53  Pulse: 72  100   Temp:   98.4 F (36.9 C)   TempSrc:   Oral   Resp:   20   Height:      Weight:      SpO2:   95%     Weight change:  Body mass index is 42.37 kg/(m^2).   Gen Exam: Awake and alert with clear speech.   Neck: Supple, No JVD.  Chest: B/L Clear.   CVS: S1 S2 Regular, no murmurs.  Abdomen: soft, BS +, non tender, non distended.  Extremities: no edema, lower extremities warm to touch. Right thigh, wrapped. Did not open Neurologic: Non Focal.   Skin: No Rash.  Wounds: N/A.    Intake/Output from previous day:  Intake/Output Summary (Last 24 hours) at 05/14/12 1327 Last data filed at 05/14/12 0900  Gross per 24 hour  Intake   1580 ml  Output   1000 ml  Net    580 ml     LAB RESULTS: CBC  Lab 05/13/12 1928 05/13/12 1307 05/13/12 1128  WBC 5.9 -- 4.6  HGB 13.7 13.9 13.8  HCT  40.7 41.0 39.6  PLT 248 -- 262  MCV 86.6 -- 86.7  MCH 29.1 -- 30.2  MCHC 33.7 -- 34.8  RDW 13.4 -- 13.4  LYMPHSABS -- -- --  MONOABS -- -- --  EOSABS -- -- --  BASOSABS -- -- --  BANDABS -- -- --    Chemistries   Lab 05/13/12 1928 05/13/12 1307 05/13/12 1128  NA -- 140 138  K -- 4.1 4.4  CL -- -- 102  CO2 -- -- 26  GLUCOSE -- 147* 149*  BUN -- -- 24*  CREATININE 1.66* -- 1.66*  CALCIUM -- -- 9.6  MG -- -- --    CBG:  Lab 05/14/12 1050 05/14/12 0715 05/13/12 2255 05/13/12 1548 05/13/12 1158  GLUCAP 180* 162* 154* 143* 136*    GFR Estimated Creatinine Clearance: 71.3 ml/min (by C-G formula based on Cr of 1.66).  Coagulation profile No results found for this basename: INR:5,PROTIME:5 in the last 168 hours  Cardiac Enzymes No results found for this basename: CK:3,CKMB:3,TROPONINI:3,MYOGLOBIN:3 in the last 168 hours  No components found with this basename: POCBNP:3 No results found for this basename: DDIMER:2 in the last 72 hours No results found for this basename: HGBA1C:2 in the last 72 hours No results found for this basename: CHOL:2,HDL:2,LDLCALC:2,TRIG:2,CHOLHDL:2,LDLDIRECT:2 in the last 72 hours No results found for this basename: TSH,T4TOTAL,FREET3,T3FREE,THYROIDAB in the last 72  hours No results found for this basename: VITAMINB12:2,FOLATE:2,FERRITIN:2,TIBC:2,IRON:2,RETICCTPCT:2 in the last 72 hours No results found for this basename: LIPASE:2,AMYLASE:2 in the last 72 hours  Urine Studies No results found for this basename: UACOL:2,UAPR:2,USPG:2,UPH:2,UTP:2,UGL:2,UKET:2,UBIL:2,UHGB:2,UNIT:2,UROB:2,ULEU:2,UEPI:2,UWBC:2,URBC:2,UBAC:2,CAST:2,CRYS:2,UCOM:2,BILUA:2 in the last 72 hours  MICROBIOLOGY: Recent Results (from the past 240 hour(s))  SURGICAL PCR SCREEN     Status: Normal   Collection Time   05/13/12 10:59 AM      Component Value Range Status Comment   MRSA, PCR NEGATIVE  NEGATIVE Final    Staphylococcus aureus NEGATIVE  NEGATIVE Final      RADIOLOGY STUDIES/RESULTS: Dg Chest 2 View  05/13/2012  *RADIOLOGY REPORT*  Clinical Data: Tendon repair.  Hypertension.  CHEST - 2 VIEW  Comparison: Two-view chest 11/15/2010.  Findings: The heart size is normal.  The lungs are clear.  The visualized soft tissues and bony thorax are unremarkable.  IMPRESSION: Negative chest.   Original Report Authenticated By: San Morelle, M.D.    Dg Knee Complete 4 Views Right  05/03/2012  *RADIOLOGY REPORT*  Clinical Data: Fall.  Anterior knee pain.  RIGHT KNEE - COMPLETE 4+ VIEW  Comparison: None.  Findings: Imaged bones, joints and soft tissues appear normal.  IMPRESSION: Negative exam.   Original Report Authenticated By: Orlean Patten, M.D.     MEDICATIONS: Scheduled Meds:   . amLODipine  10 mg Oral Daily  . enoxaparin (LOVENOX) injection  30 mg Subcutaneous Q12H  . glimepiride  4 mg Oral QAC breakfast  . hydrochlorothiazide  25 mg Oral Daily  . insulin aspart  0-9 Units Subcutaneous TID WC  . irbesartan  300 mg Oral Daily  . lisinopril  40 mg Oral BID  . nebivolol  10 mg Oral BID   Continuous Infusions:   . sodium chloride 20 mL/hr (05/13/12 2059)   PRN Meds:.acetaminophen, acetaminophen, hydrALAZINE, HYDROmorphone (DILAUDID) injection, menthol-cetylpyridinium, methocarbamol (ROBAXIN) IV, methocarbamol, metoCLOPramide (REGLAN) injection, metoCLOPramide, ondansetron (ZOFRAN) IV, ondansetron, oxyCODONE, phenol  Antibiotics: Anti-infectives     Start     Dose/Rate Route Frequency Ordered Stop   05/13/12 1915   ceFAZolin (ANCEF) 3 g in dextrose 5 % 50 mL IVPB        3 g 160 mL/hr over 30 Minutes Intravenous Every 6 hours 05/13/12 1841 05/14/12 0129   05/13/12 1102   ceFAZolin (ANCEF) 2-3 GM-% IVPB SOLR     Comments: SAVAGE, MICHELLE: cabinet override         05/13/12 1102 05/13/12 1311   05/13/12 1045   ceFAZolin (ANCEF) IVPB 2 g/50 mL premix  Status:  Discontinued        2 g 100 mL/hr over 30 Minutes Intravenous On call to  O.R. 05/13/12 1036 05/13/12 [place] [place] Pager:[phone]  If 7PM-7AM, please contact night-coverage [email or web address] Password [email or web address] 05/14/2012, 1:27 PM   LOS: 1 day

## 2012-09-27 IMAGING — CR DG CHEST 2V
2 series · 2 of 2 positions shown · non-contrast
Comparison: [DATE] and 10/17/2009

CLINICAL DATA: Preoperative respiratory exam.    Sleep apnea.

CHEST - 2 VIEW

[view not recorded (1 of 2)]
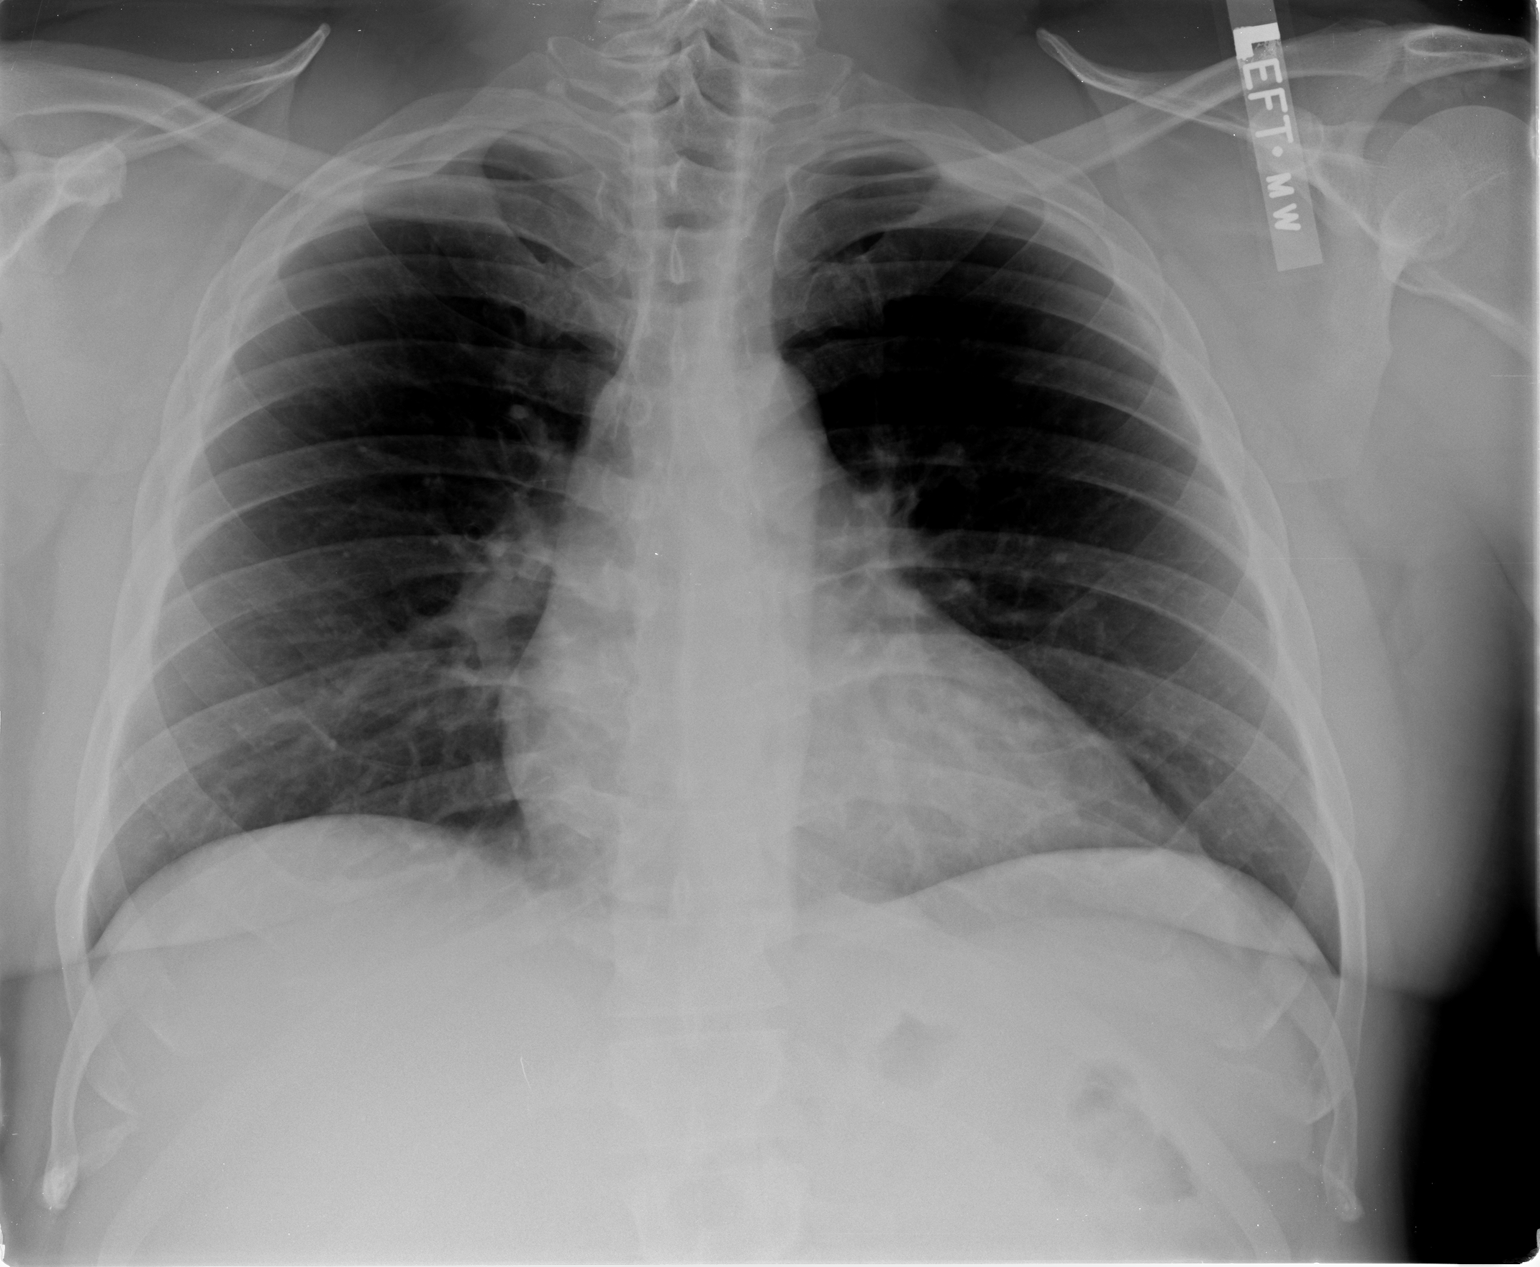

[view not recorded (2 of 2)]
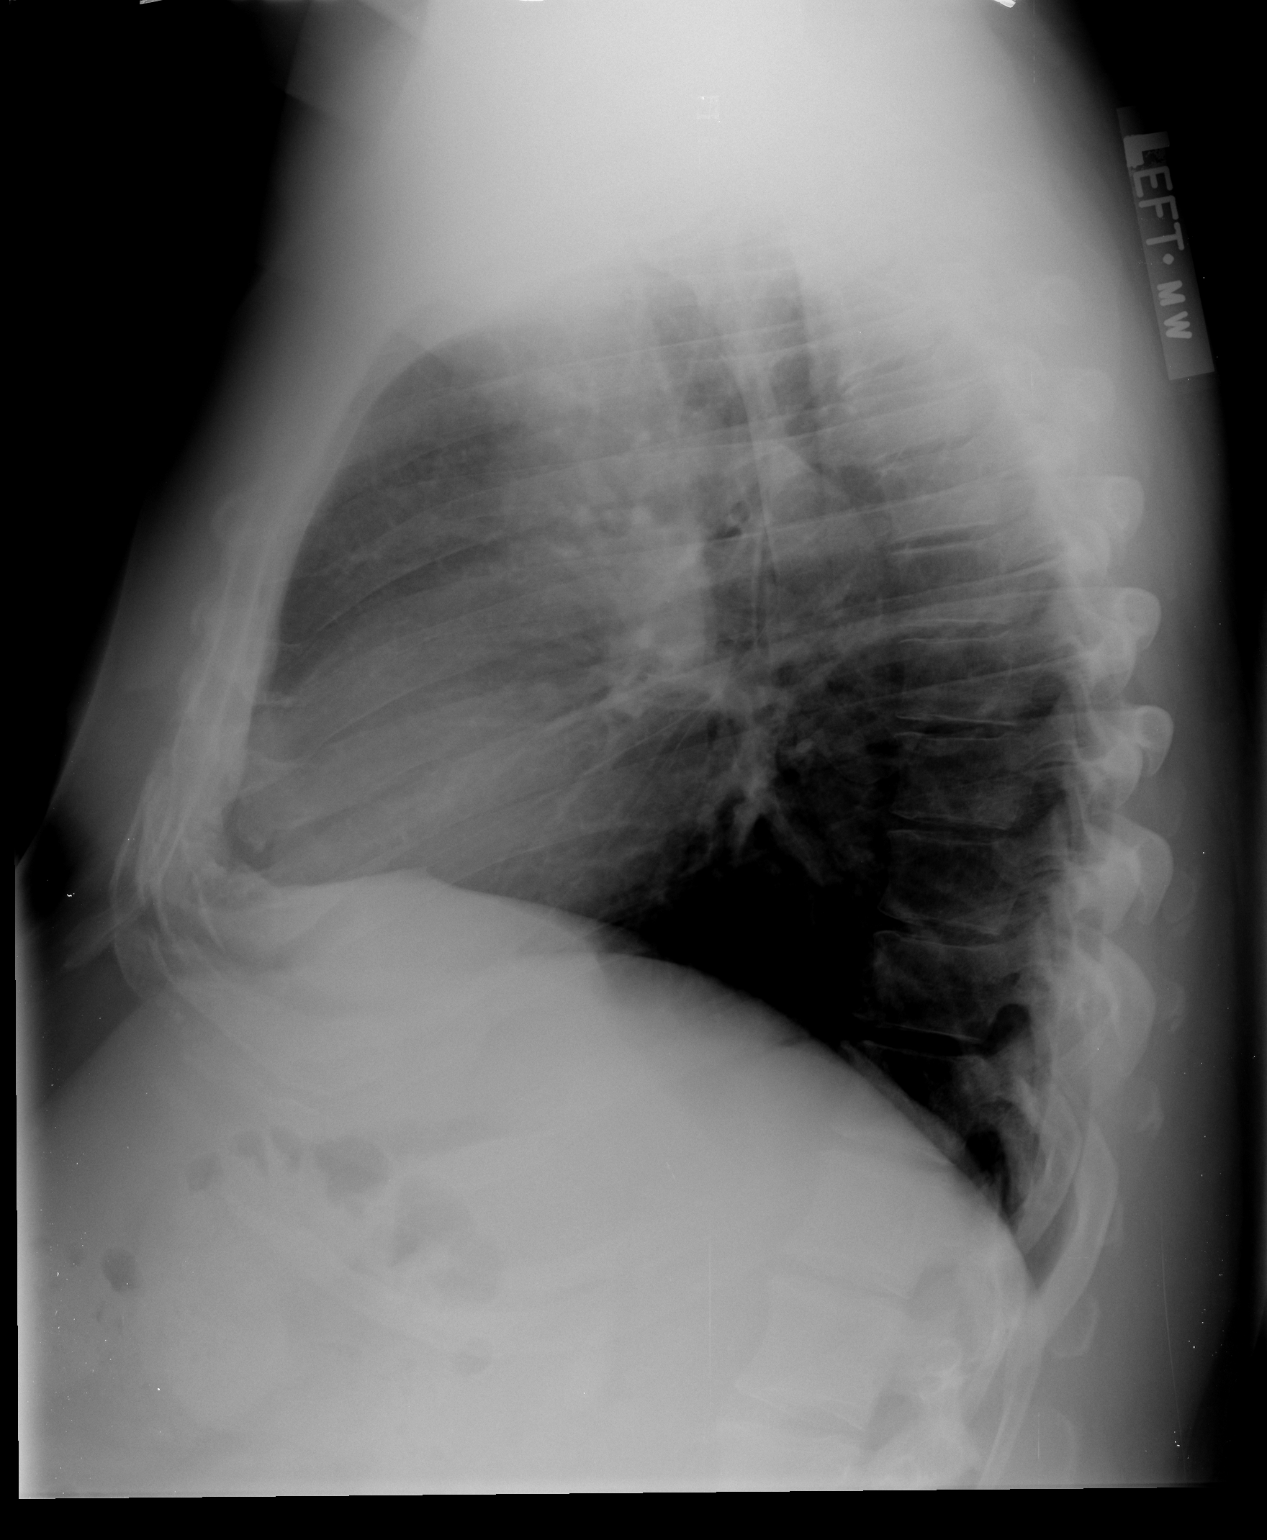

[2 of 2 positions shown; findings below may reference images not displayed]

FINDINGS: Heart size and vascularity are normal and the lungs are
clear.  No osseous abnormality.
IMPRESSION: Normal chest.

## 2014-08-08 ENCOUNTER — Other Ambulatory Visit: Payer: Self-pay | Admitting: Nephrology

## 2014-08-08 DIAGNOSIS — N183 Chronic kidney disease, stage 3 unspecified: Secondary | ICD-10-CM

## 2014-08-10 ENCOUNTER — Ambulatory Visit
Admission: RE | Admit: 2014-08-10 | Discharge: 2014-08-10 | Disposition: A | Discharge status: Home / Self Care | Payer: Managed Care, Other (non HMO) | Source: Ambulatory Visit | Attending: Nephrology | Admitting: Nephrology

## 2014-08-10 DIAGNOSIS — N183 Chronic kidney disease, stage 3 unspecified: Secondary | ICD-10-CM

## 2014-11-14 ENCOUNTER — Emergency Department (HOSPITAL_COMMUNITY)
Admission: EM | Admit: 2014-11-14 | Discharge: 2014-11-14 | Disposition: A | Discharge status: Home / Self Care | Payer: Managed Care, Other (non HMO) | Attending: Emergency Medicine | Admitting: Emergency Medicine

## 2014-11-14 ENCOUNTER — Encounter (HOSPITAL_COMMUNITY): Payer: Self-pay | Admitting: Physical Medicine and Rehabilitation

## 2014-11-14 DIAGNOSIS — Z79899 Other long term (current) drug therapy: Secondary | ICD-10-CM | POA: Diagnosis not present

## 2014-11-14 DIAGNOSIS — Z791 Long term (current) use of non-steroidal anti-inflammatories (NSAID): Secondary | ICD-10-CM | POA: Diagnosis not present

## 2014-11-14 DIAGNOSIS — I10 Essential (primary) hypertension: Secondary | ICD-10-CM | POA: Diagnosis not present

## 2014-11-14 DIAGNOSIS — E119 Type 2 diabetes mellitus without complications: Secondary | ICD-10-CM | POA: Diagnosis not present

## 2014-11-14 DIAGNOSIS — L0211 Cutaneous abscess of neck: Secondary | ICD-10-CM | POA: Insufficient documentation

## 2014-11-14 DIAGNOSIS — L0291 Cutaneous abscess, unspecified: Secondary | ICD-10-CM

## 2014-11-14 MED ORDER — LIDOCAINE-EPINEPHRINE (PF) 2 %-1:200000 IJ SOLN
10.0000 mL | Freq: Once | INTRAMUSCULAR | Status: DC
  Filled 2014-11-14: qty 20

## 2014-11-14 MED ORDER — CEPHALEXIN 500 MG PO CAPS: 500.0000 mg | ORAL_CAPSULE | Freq: Four times a day (QID) | ORAL | Status: DC

## 2014-11-14 NOTE — ED Provider Notes (Signed)
CSN: [id number]     Arrival date & time 11/14/14  1634 History  This chart was scribed for Kevin Locket, PA-C, working with Kevin Boss, MD by [name], ED Scribe. The patient was seen in room TR06C/TR06C at 5:23 PM.    Chief Complaint  Patient presents with  . Abscess      The history is provided by the patient. No language interpreter was used.    HPI Comments: [name] is a 49 y.o. male with a medical hx of HTN and DM who presents to the Emergency Department complaining of abscess to back of neck onset 1 week. Pt reports that he is having increasing pain today. Pt was seen by his PCP PTA and was informed to come into the ED for I&D because his PCP didn't have the appropriate equipment to do the procedure. Pt rates the pain as 7/10 that is worse with touching. He states that he has not tried any medications for the relief for his symptoms. He denies fever, neck stiffness, neck pain, and any other symptoms.    Past Medical History  Diagnosis Date  . Hypertension   . Sinus congestion     all the time  . Diabetes mellitus without complication   . Sleep apnea    Past Surgical History  Procedure Laterality Date  . Nasal septum surgery    . Quadriceps tendon repair  05/13/2012    Procedure: REPAIR QUADRICEP TENDON;  Surgeon: Augustin Schooling, MD;  Location: [place];  Service: Orthopedics;  Laterality: Right;   History reviewed. No pertinent family history. History  Substance Use Topics  . Smoking status: Never Smoker   . Smokeless tobacco: Never Used  . Alcohol Use: 3.0 oz/week    5 Cans of beer per week     Comment: Social    Review of Systems  Constitutional: Negative for fever.  Musculoskeletal: Negative for neck pain and neck stiffness.  Skin: Negative for color change.       Abscess to the back of neck      Allergies  Review of patient's allergies indicates no known allergies.  Home Medications   Prior to Admission medications   Medication Sig Start  Date End Date Taking? Authorizing Provider  amLODipine-valsartan (EXFORGE) 10-320 MG per tablet Take 1 tablet by mouth daily.    Historical Provider, MD  cephALEXin (KEFLEX) 500 MG capsule Take 1 capsule (500 mg total) by mouth 4 (four) times daily. 11/14/14   Kevin Locket, PA-C  glimepiride (AMARYL) 4 MG tablet Take 4 mg by mouth daily before breakfast.    Historical Provider, MD  hydrochlorothiazide (HYDRODIURIL) 25 MG tablet Take 25 mg by mouth daily.    Historical Provider, MD  lisinopril (PRINIVIL,ZESTRIL) 40 MG tablet Take 40 mg by mouth 2 (two) times daily.    Historical Provider, MD  meloxicam (MOBIC) 15 MG tablet Take 15 mg by mouth daily.    Historical Provider, MD  metFORMIN (GLUCOPHAGE) 1000 MG tablet Take 1,000 mg by mouth 2 (two) times daily with a meal.    Historical Provider, MD  methocarbamol (ROBAXIN) 500 MG tablet Take 1 tablet (500 mg total) by mouth 3 (three) times daily as needed. 05/13/12   [name], MD  methocarbamol (ROBAXIN) 500 MG tablet Take 1 tablet (500 mg total) by mouth every 6 (six) hours as needed. 05/14/12   Brad Dixon, PA-C  nebivolol (BYSTOLIC) 10 MG tablet Take 10 mg by mouth 2 (two) times daily.  Historical Provider, MD  oxyCODONE (OXY IR/ROXICODONE) 5 MG immediate release tablet Take 1-2 tablets (5-10 mg total) by mouth every 3 (three) hours as needed. 05/14/12   Brad Dixon, PA-C  oxyCODONE-acetaminophen (ROXICET) 5-325 MG per tablet Take 1-2 tablets by mouth every 4 (four) hours as needed for pain. 05/13/12   [name], MD  Phenylephrine-Acetaminophen (TYLENOL SINUS CONGESTION/PAIN PO) Take 1 tablet by mouth every 8 (eight) hours as needed. For congestion/headache    Historical Provider, MD   BP 154/98 mmHg  Pulse 70  Temp(Src) 98.8 F (37.1 C) (Oral)  Resp 18  SpO2 98% Physical Exam  Constitutional: He is oriented to person, place, and time. He appears well-developed and well-nourished. No distress.  HENT:  Head: Normocephalic and atraumatic.   Eyes: EOM are normal.  Neck: Normal range of motion. Neck supple. No tracheal deviation present.  Cardiovascular: Normal rate.   Pulmonary/Chest: Effort normal. No respiratory distress.  Musculoskeletal: Normal range of motion.  Neurological: He is alert and oriented to person, place, and time.  Skin: Skin is warm and dry.  Large tender abscess noted to posterior right neck.   Psychiatric: He has a normal mood and affect. His behavior is normal.  Nursing note and vitals reviewed.   ED Course  Procedures (including critical care time) INCISION AND DRAINAGE Performed by: Verl Dicker Consent: Verbal consent obtained. Risks and benefits: risks, benefits and alternatives were discussed Type: abscess  Body area: Posterior neck  Anesthesia: local infiltration  Incision was made with a scalpel.  Local anesthetic: lidocaine 2 % with epinephrine  Anesthetic total: 2 ml  Complexity: complex Blunt dissection to break up loculations  Drainage: purulent  Drainage amount: Copious   Packing material: None   Patient tolerance: Patient tolerated the procedure well with no immediate complications.  EMERGENCY DEPARTMENT US SOFT TISSUE INTERPRETATION "Study: Limited Ultrasound of the noted body part in comments below"  INDICATIONS: Soft tissue infection Multiple views of the body part are obtained with a multi-frequency linear probe  PERFORMED BY:  Myself  IMAGES ARCHIVED?: Yes  SIDE:Right   BODY PART:Neck  FINDINGS: Abcess present  LIMITATIONS:  Body Habitus  INTERPRETATION:  Abcess present  COMMENT:  abscess without cellulitis    DIAGNOSTIC STUDIES: Oxygen Saturation is 98% on RA, nl by my interpretation.    COORDINATION OF CARE: 5:27 PM-Discussed treatment plan with pt at bedside and pt agreed to plan.   Labs Review Labs Reviewed - No data to display  Imaging Review No results found.   EKG Interpretation None     Meds given in  ED:  Medications  lidocaine-EPINEPHrine (XYLOCAINE W/EPI) 2 %-1:200000 (PF) injection 10 mL (not administered)    New Prescriptions   CEPHALEXIN (KEFLEX) 500 MG CAPSULE    Take 1 capsule (500 mg total) by mouth 4 (four) times daily.   Filed Vitals:   11/14/14 1648  BP: 154/98  Pulse: 70  Temp: 98.8 F (37.1 C)  TempSrc: Oral  Resp: 18  SpO2: 98%    MDM  Vitals stable - WNL -afebrile Pt resting comfortably in ED. PE--has cyst on posterior neck, likely infected epidermal cyst. No evidence of surrounding cellulitis. Imaging--abscess confirmed via ultrasound at bedside by myself.  Abscess drained at bedside by myself. No cellulitis,However due to past medical history of diabetes, will treat with Keflex . Pt will be able to fu with PCP in 3 days for wound recheck. No other acute or emergent pathology at this time. Pt stable for  discharge. I discussed all relevant lab findings and imaging results with pt and they verbalized understanding. Discussed f/u with PCP within 48 hrs and return precautions, pt very amenable to plan.  Final diagnoses:  Abscess    I personally performed the services described in this documentation, which was scribed in my presence. The recorded information has been reviewed and is accurate.    Kevin Locket, PA-C 11/15/14 0200  Kevin Boss, MD 11/16/14 [phone]

## 2014-11-14 NOTE — Discharge Instructions (Signed)
Take your antibiotics as directed. Follow-up with your PCP in 3 days for wound recheck. Return to ED for worsening symptoms.  Abscess Care After An abscess (also called a boil or furuncle) is an infected area that contains a collection of pus. Signs and symptoms of an abscess include pain, tenderness, redness, or hardness, or you may feel a moveable soft area under your skin. An abscess can occur anywhere in the body. The infection may spread to surrounding tissues causing cellulitis. A cut (incision) by the surgeon was made over your abscess and the pus was drained out. Gauze may have been packed into the space to provide a drain that will allow the cavity to heal from the inside outwards. The boil may be painful for 5 to 7 days. Most people with a boil do not have high fevers. Your abscess, if seen early, may not have localized, and may not have been lanced. If not, another appointment may be required for this if it does not get better on its own or with medications. HOME CARE INSTRUCTIONS   Only take over-the-counter or prescription medicines for pain, discomfort, or fever as directed by your caregiver.  When you bathe, soak and then remove gauze or iodoform packs at least daily or as directed by your caregiver. You may then wash the wound gently with mild soapy water. Repack with gauze or do as your caregiver directs. SEEK IMMEDIATE MEDICAL CARE IF:   You develop increased pain, swelling, redness, drainage, or bleeding in the wound site.  You develop signs of generalized infection including muscle aches, chills, fever, or a general ill feeling.  An oral temperature above 102 F (38.9 C) develops, not controlled by medication. See your caregiver for a recheck if you develop any of the symptoms described above. If medications (antibiotics) were prescribed, take them as directed. Document Released: 10/31/2004 Document Revised: 07/07/2011 Document Reviewed: 06/28/2007 [facility] Patient  Information 2015 [place], [place]. This information is not intended to replace advice given to you by your health care provider. Make sure you discuss any questions you have with your health care provider.

## 2014-11-14 NOTE — ED Notes (Signed)
Pt states abscess to back of neck x1 week. Reports increased pain today.

## 2015-11-06 DIAGNOSIS — J343 Hypertrophy of nasal turbinates: Secondary | ICD-10-CM | POA: Insufficient documentation

## 2015-11-06 DIAGNOSIS — G4733 Obstructive sleep apnea (adult) (pediatric): Secondary | ICD-10-CM | POA: Insufficient documentation

## 2015-11-06 DIAGNOSIS — J310 Chronic rhinitis: Secondary | ICD-10-CM | POA: Insufficient documentation

## 2016-01-31 ENCOUNTER — Other Ambulatory Visit (HOSPITAL_COMMUNITY): Payer: Self-pay | Admitting: *Deleted

## 2016-02-01 ENCOUNTER — Encounter (HOSPITAL_COMMUNITY): Payer: Managed Care, Other (non HMO)

## 2016-02-05 ENCOUNTER — Other Ambulatory Visit (HOSPITAL_COMMUNITY): Payer: Self-pay | Admitting: *Deleted

## 2016-02-06 ENCOUNTER — Encounter (HOSPITAL_COMMUNITY)
Admission: RE | Admit: 2016-02-06 | Discharge: 2016-02-06 | Disposition: A | Discharge status: Home / Self Care | Payer: Managed Care, Other (non HMO) | Source: Ambulatory Visit | Attending: Nephrology | Admitting: Nephrology

## 2016-02-06 DIAGNOSIS — D509 Iron deficiency anemia, unspecified: Secondary | ICD-10-CM | POA: Diagnosis not present

## 2016-02-06 MED ORDER — SODIUM CHLORIDE 0.9 % IV SOLN
510.0000 mg | INTRAVENOUS | Status: DC
  Administered 2016-02-06: 510 mg via INTRAVENOUS
  Filled 2016-02-06: qty 17

## 2016-02-06 NOTE — Discharge Instructions (Signed)

## 2016-02-13 ENCOUNTER — Encounter (HOSPITAL_COMMUNITY)
Admission: RE | Admit: 2016-02-13 | Discharge: 2016-02-13 | Disposition: A | Discharge status: Home / Self Care | Payer: Managed Care, Other (non HMO) | Source: Ambulatory Visit | Attending: Nephrology | Admitting: Nephrology

## 2016-02-13 DIAGNOSIS — D509 Iron deficiency anemia, unspecified: Secondary | ICD-10-CM | POA: Diagnosis not present

## 2016-02-13 MED ORDER — SODIUM CHLORIDE 0.9 % IV SOLN
510.0000 mg | Freq: Once | INTRAVENOUS | Status: AC
  Administered 2016-02-13: 510 mg via INTRAVENOUS
  Filled 2016-02-13: qty 17

## 2016-10-08 ENCOUNTER — Emergency Department (HOSPITAL_COMMUNITY)
Admission: EM | Admit: 2016-10-08 | Discharge: 2016-10-08 | Disposition: A | Discharge status: Home / Self Care | Payer: Managed Care, Other (non HMO) | Attending: Emergency Medicine | Admitting: Emergency Medicine

## 2016-10-08 ENCOUNTER — Encounter (HOSPITAL_COMMUNITY): Payer: Self-pay | Admitting: *Deleted

## 2016-10-08 DIAGNOSIS — E119 Type 2 diabetes mellitus without complications: Secondary | ICD-10-CM | POA: Diagnosis not present

## 2016-10-08 DIAGNOSIS — M25571 Pain in right ankle and joints of right foot: Secondary | ICD-10-CM | POA: Insufficient documentation

## 2016-10-08 DIAGNOSIS — Z7984 Long term (current) use of oral hypoglycemic drugs: Secondary | ICD-10-CM | POA: Insufficient documentation

## 2016-10-08 DIAGNOSIS — I10 Essential (primary) hypertension: Secondary | ICD-10-CM | POA: Diagnosis not present

## 2016-10-08 MED ORDER — DICLOFENAC SODIUM 1 % TD GEL: TRANSDERMAL | 0 refills | Status: AC

## 2016-10-08 NOTE — ED Notes (Signed)
Pain in right ankle started 2 days ago. Denies hx of gout but believes he might have gout. He denies injury. States this pain has happened before and that this time it's worse. Usually goes away on it's own. Ankle swollen, +2

## 2016-10-08 NOTE — ED Triage Notes (Signed)
Pt states pain to medial aspect of R ankle since yesterday.  Denies injury.

## 2016-10-08 NOTE — ED Provider Notes (Signed)
[facility] DEPT Provider Note   CSN: [id number] Arrival date & time: 10/08/16  0740     History   Chief Complaint Chief Complaint  Patient presents with  . Ankle Pain    HPI [name] is a 51 y.o. male.  The history is provided by the patient.  Ankle Pain   The incident occurred yesterday. The incident occurred at work. Injury mechanism: No remembered injury. Pain location: Right medial ankle. The pain is moderate. The pain has been constant since onset. Pertinent negatives include no numbness, no inability to bear weight, no loss of motion, no muscle weakness, no loss of sensation and no tingling. He reports no foreign bodies present. The symptoms are aggravated by palpation. He has tried immobilization and acetaminophen for the symptoms. The treatment provided mild relief.   Denies history of gout, fall, twisting.   Past Medical History:  Diagnosis Date  . Diabetes mellitus without complication ([place])   . Hypertension   . Sinus congestion    all the time  . Sleep apnea     There are no active problems to display for this patient.   Past Surgical History:  Procedure Laterality Date  . NASAL SEPTUM SURGERY    . QUADRICEPS TENDON REPAIR  05/13/2012   Procedure: REPAIR QUADRICEP TENDON;  Surgeon: Augustin Schooling, MD;  Location: [facility];  Service: Orthopedics;  Laterality: Right;       Home Medications    Prior to Admission medications   Medication Sig Start Date End Date Taking? Authorizing Provider  amLODipine-valsartan (EXFORGE) 10-320 MG per tablet Take 1 tablet by mouth daily.    [provider]  cephALEXin (KEFLEX) 500 MG capsule Take 1 capsule (500 mg total) by mouth 4 (four) times daily. 11/14/14   Cartner, Marland Kitchen, PA-C  diclofenac sodium (VOLTAREN) 1 % GEL Apply small (dime size amount) to affected area up to 4 times per day. 10/08/16 10/17/16  Fatima Blank, MD  glimepiride (AMARYL) 4 MG tablet Take 4 mg by mouth daily before  breakfast.    [provider]  hydrochlorothiazide (HYDRODIURIL) 25 MG tablet Take 25 mg by mouth daily.    [provider]  lisinopril (PRINIVIL,ZESTRIL) 40 MG tablet Take 40 mg by mouth 2 (two) times daily.    [provider]  meloxicam (MOBIC) 15 MG tablet Take 15 mg by mouth daily.    [provider]  metFORMIN (GLUCOPHAGE) 1000 MG tablet Take 1,000 mg by mouth 2 (two) times daily with a meal.    [provider]  methocarbamol (ROBAXIN) 500 MG tablet Take 1 tablet (500 mg total) by mouth 3 (three) times daily as needed. 05/13/12   [name], MD  methocarbamol (ROBAXIN) 500 MG tablet Take 1 tablet (500 mg total) by mouth every 6 (six) hours as needed. 05/14/12   Dixon, Robb Matar, PA-C  nebivolol (BYSTOLIC) 10 MG tablet Take 10 mg by mouth 2 (two) times daily.    [provider]  oxyCODONE (OXY IR/ROXICODONE) 5 MG immediate release tablet Take 1-2 tablets (5-10 mg total) by mouth every 3 (three) hours as needed. 05/14/12   Dixon, Robb Matar, PA-C  oxyCODONE-acetaminophen (ROXICET) 5-325 MG per tablet Take 1-2 tablets by mouth every 4 (four) hours as needed for pain. 05/13/12   [name], MD  Phenylephrine-Acetaminophen (TYLENOL SINUS CONGESTION/PAIN PO) Take 1 tablet by mouth every 8 (eight) hours as needed. For congestion/headache    [provider]    Family History No  family history on file.  Social History Social History  Substance Use Topics  . Smoking status: Never Smoker  . Smokeless tobacco: Never Used  . Alcohol use 3.0 oz/week    5 Cans of beer per week     Comment: Social     Allergies   Patient has no known allergies.   Review of Systems Review of Systems  Musculoskeletal: Negative for arthralgias, gait problem, joint swelling and myalgias.  Neurological: Negative for tingling and numbness.     Physical Exam Updated Vital Signs BP (!) 148/80 (BP Location: Right Arm)   Pulse (!) 57    Temp 97.8 F (36.6 C) (Oral)   Resp 16   Ht 5\' 8"  (1.727 m)   Wt (!) 152 kg (335 lb)   SpO2 99%   BMI 50.94 kg/m   Physical Exam  Constitutional: He is oriented to person, place, and time. He appears well-developed and well-nourished. No distress.  HENT:  Head: Normocephalic and atraumatic.  Right Ear: External ear normal.  Left Ear: External ear normal.  Nose: Nose normal.  Mouth/Throat: Mucous membranes are normal. No trismus in the jaw.  Eyes: Conjunctivae and EOM are normal. No scleral icterus.  Neck: Normal range of motion and phonation normal.  Cardiovascular: Normal rate and regular rhythm.   Pulmonary/Chest: Effort normal. No stridor. No respiratory distress.  Abdominal: He exhibits no distension.  Musculoskeletal: Normal range of motion. He exhibits no edema.       Right ankle: He exhibits normal range of motion, no swelling, no deformity and no laceration. Tenderness. Medial malleolus tenderness found.       Feet:  Neurological: He is alert and oriented to person, place, and time.  Skin: He is not diaphoretic.  Psychiatric: He has a normal mood and affect. His behavior is normal.  Vitals reviewed.    ED Treatments / Results  Labs (all labs ordered are listed, but only abnormal results are displayed) Labs Reviewed - No data to display  EKG  EKG Interpretation None       Radiology No results found.  Procedures Procedures (including critical care time)  Medications Ordered in ED Medications - No data to display   Initial Impression / Assessment and Plan / ED Course  I have reviewed the triage vital signs and the nursing notes.  Pertinent labs & imaging results that were available during my care of the patient were reviewed by me and considered in my medical decision making (see chart for details).     The suspicion for DVT, septic arthritis, arterial occlusion, acute fracture/dislocation. No indication for imaging at this time.  Patient has small  ecchymosis over the medial malleolus which is the area is tender to palpation. Likely minor trauma the patient did not remember.  Recommended monitoring and PCP follow-up as needed.  The patient is safe for discharge with strict return precautions.   Final Clinical Impressions(s) / ED Diagnoses   Final diagnoses:  Acute right ankle pain   Disposition: Discharge  Condition: Good  I have discussed the results, Dx and Tx plan with the patient who expressed understanding and agree(s) with the plan. Discharge instructions discussed at great length. The patient was given strict return precautions who verbalized understanding of the instructions. No further questions at time of discharge.    Discharge Medication List as of 10/08/2016  8:51 AM    START taking these medications   Details  diclofenac sodium (VOLTAREN) 1 % GEL Apply small (dime size  amount) to affected area up to 4 times per day., Print        Follow Up: Wallene Huh, MD [place] [id number] [phone]  Schedule an appointment as soon as possible for a visit  in 5-7 days, If symptoms do not improve or  worsen      Derrik Mceachern, Grayce Sessions, MD 10/08/16 [phone]

## 2016-11-18 ENCOUNTER — Emergency Department (HOSPITAL_COMMUNITY)
Admission: EM | Admit: 2016-11-18 | Discharge: 2016-11-18 | Disposition: A | Discharge status: Home / Self Care | Payer: 59 | Attending: Emergency Medicine | Admitting: Emergency Medicine

## 2016-11-18 DIAGNOSIS — M545 Low back pain, unspecified: Secondary | ICD-10-CM

## 2016-11-18 DIAGNOSIS — Z79899 Other long term (current) drug therapy: Secondary | ICD-10-CM | POA: Diagnosis not present

## 2016-11-18 DIAGNOSIS — I10 Essential (primary) hypertension: Secondary | ICD-10-CM | POA: Diagnosis not present

## 2016-11-18 DIAGNOSIS — E119 Type 2 diabetes mellitus without complications: Secondary | ICD-10-CM | POA: Diagnosis not present

## 2016-11-18 DIAGNOSIS — Z7984 Long term (current) use of oral hypoglycemic drugs: Secondary | ICD-10-CM | POA: Diagnosis not present

## 2016-11-18 MED ORDER — HYDROCODONE-ACETAMINOPHEN 5-325 MG PO TABS: 1.0000 | ORAL_TABLET | Freq: Four times a day (QID) | ORAL | 0 refills | Status: DC | PRN

## 2016-11-18 MED ORDER — IBUPROFEN 400 MG PO TABS
ORAL_TABLET | ORAL | Status: AC
  Filled 2016-11-18: qty 1

## 2016-11-18 MED ORDER — CYCLOBENZAPRINE HCL 10 MG PO TABS: 10.0000 mg | ORAL_TABLET | Freq: Two times a day (BID) | ORAL | 0 refills | Status: DC | PRN

## 2016-11-18 MED ORDER — IBUPROFEN 800 MG PO TABS: 800.0000 mg | ORAL_TABLET | Freq: Three times a day (TID) | ORAL | 0 refills | Status: DC

## 2016-11-18 NOTE — ED Provider Notes (Signed)
[facility] DEPT Provider Note   CSN: [id number] Arrival date & time: 11/18/16  0222     History   Chief Complaint Chief Complaint  Patient presents with  . Back Pain    HPI [name] is a 51 y.o. male.  Patient presents to the emergency department with chief complaint left-sided low back pain. He states that the symptoms started several days ago. He reports that the symptoms are worsened with movement and palpation. He denies any associated fevers, chills, nausea, vomiting, dysuria, hematuria, bowel or bladder incontinence. He denies any history of cancer in himself. Denies any injection drug use. Denies any numbness, weakness, or tingling. He rates pain is moderate.   The history is provided by the patient. No language interpreter was used.    Past Medical History:  Diagnosis Date  . Diabetes mellitus without complication ([place])   . Hypertension   . Sinus congestion    all the time  . Sleep apnea     There are no active problems to display for this patient.   Past Surgical History:  Procedure Laterality Date  . NASAL SEPTUM SURGERY    . QUADRICEPS TENDON REPAIR  05/13/2012   Procedure: REPAIR QUADRICEP TENDON;  Surgeon: Augustin Schooling, MD;  Location: [facility];  Service: Orthopedics;  Laterality: Right;       Home Medications    Prior to Admission medications   Medication Sig Start Date End Date Taking? Authorizing Provider  amLODipine-valsartan (EXFORGE) 10-320 MG per tablet Take 1 tablet by mouth daily.    [provider]  cephALEXin (KEFLEX) 500 MG capsule Take 1 capsule (500 mg total) by mouth 4 (four) times daily. 11/14/14   Cartner, Marland Kitchen, PA-C  cyclobenzaprine (FLEXERIL) 10 MG tablet Take 1 tablet (10 mg total) by mouth 2 (two) times daily as needed for muscle spasms. 11/18/16   Montine Circle, PA-C  glimepiride (AMARYL) 4 MG tablet Take 4 mg by mouth daily before breakfast.    [provider]  hydrochlorothiazide (HYDRODIURIL) 25  MG tablet Take 25 mg by mouth daily.    [provider]  HYDROcodone-acetaminophen (NORCO/VICODIN) 5-325 MG tablet Take 1-2 tablets by mouth every 6 (six) hours as needed. 11/18/16   Montine Circle, PA-C  ibuprofen (ADVIL,MOTRIN) 800 MG tablet Take 1 tablet (800 mg total) by mouth 3 (three) times daily. 11/18/16   Montine Circle, PA-C  lisinopril (PRINIVIL,ZESTRIL) 40 MG tablet Take 40 mg by mouth 2 (two) times daily.    [provider]  meloxicam (MOBIC) 15 MG tablet Take 15 mg by mouth daily.    [provider]  metFORMIN (GLUCOPHAGE) 1000 MG tablet Take 1,000 mg by mouth 2 (two) times daily with a meal.    [provider]  methocarbamol (ROBAXIN) 500 MG tablet Take 1 tablet (500 mg total) by mouth 3 (three) times daily as needed. 05/13/12   [name], MD  methocarbamol (ROBAXIN) 500 MG tablet Take 1 tablet (500 mg total) by mouth every 6 (six) hours as needed. 05/14/12   Dixon, Robb Matar, PA-C  nebivolol (BYSTOLIC) 10 MG tablet Take 10 mg by mouth 2 (two) times daily.    [provider]  oxyCODONE (OXY IR/ROXICODONE) 5 MG immediate release tablet Take 1-2 tablets (5-10 mg total) by mouth every 3 (three) hours as needed. 05/14/12   Dixon, Robb Matar, PA-C  oxyCODONE-acetaminophen (ROXICET) 5-325 MG per tablet Take 1-2 tablets by mouth every 4 (four) hours as needed for pain. 05/13/12  [name], MD  Phenylephrine-Acetaminophen (TYLENOL SINUS CONGESTION/PAIN PO) Take 1 tablet by mouth every 8 (eight) hours as needed. For congestion/headache    [provider]    Family History No family history on file.  Social History Social History  Substance Use Topics  . Smoking status: Never Smoker  . Smokeless tobacco: Never Used  . Alcohol use 3.0 oz/week    5 Cans of beer per week     Comment: Social     Allergies   Patient has no known allergies.   Review of Systems Review of Systems  Constitutional: Negative for chills  and fever.  Gastrointestinal:       No bowel incontinence  Genitourinary:       No urinary incontinence  Musculoskeletal: Positive for arthralgias, back pain and myalgias.  Neurological:       No saddle anesthesia     Physical Exam Updated Vital Signs BP (!) 137/91 (BP Location: Left Arm)   Pulse 77   Temp 98.2 F (36.8 C) (Oral)   Resp 18   Ht 5\' 8"  (1.727 m)   Wt (!) 149.7 kg (330 lb)   SpO2 97%   BMI 50.18 kg/m   Physical Exam Physical Exam  Constitutional: Pt appears well-developed and well-nourished. No distress.  HENT:  Head: Normocephalic and atraumatic.  Mouth/Throat: Oropharynx is clear and moist. No oropharyngeal exudate.  Eyes: Conjunctivae are normal.  Neck: Normal range of motion. Neck supple.  No meningismus Cardiovascular: Normal rate, regular rhythm and intact distal pulses.   Pulmonary/Chest: Effort normal and breath sounds normal. No respiratory distress. Pt has no wheezes.  Abdominal: Pt exhibits no distension Musculoskeletal:  Left lumbar paraspinal muscles tender to palpation, no bony CTLS spine tenderness, deformity, step-off, or crepitus Lymphadenopathy: Pt has no cervical adenopathy.  Neurological: Pt is alert and oriented Speech is clear and goal oriented, follows commands Normal 5/5 strength in upper and lower extremities bilaterally including dorsiflexion and plantar flexion, strong and equal grip strength Sensation intact Great toe extension intact Moves extremities without ataxia, coordination intact Normal gait Normal balance No Clonus Skin: Skin is warm and dry. No rash noted. Pt is not diaphoretic. No erythema.  Psychiatric: Pt has a normal mood and affect. Behavior is normal.  Nursing note and vitals reviewed.   ED Treatments / Results  Labs (all labs ordered are listed, but only abnormal results are displayed) Labs Reviewed - No data to display  EKG  EKG Interpretation None       Radiology No results  found.  Procedures Procedures (including critical care time)  Medications Ordered in ED Medications  ibuprofen (ADVIL,MOTRIN) 400 MG tablet (not administered)     Initial Impression / Assessment and Plan / ED Course  I have reviewed the triage vital signs and the nursing notes.  Pertinent labs & imaging results that were available during my care of the patient were reviewed by me and considered in my medical decision making (see chart for details).     Patient with back pain.  No neurological deficits and normal neuro exam.  Patient is ambulatory.  No loss of bowel or bladder control.  Doubt cauda equina.  Denies fever,  doubt epidural abscess or other lesion. Recommend back exercises, stretching, RICE, and will treat with a short course of pain meds and muscle relaxer.  Encouraged the patient that there could be a need for additional workup and/or imaging such as MRI, if the symptoms do not resolve. Patient advised that  if the back pain does not resolve, or radiates, this could progress to more serious conditions and is encouraged to follow-up with PCP or orthopedics within 2 weeks.     Final Clinical Impressions(s) / ED Diagnoses   Final diagnoses:  Acute left-sided low back pain without sciatica    New Prescriptions New Prescriptions   CYCLOBENZAPRINE (FLEXERIL) 10 MG TABLET    Take 1 tablet (10 mg total) by mouth 2 (two) times daily as needed for muscle spasms.   HYDROCODONE-ACETAMINOPHEN (NORCO/VICODIN) 5-325 MG TABLET    Take 1-2 tablets by mouth every 6 (six) hours as needed.   IBUPROFEN (ADVIL,MOTRIN) 800 MG TABLET    Take 1 tablet (800 mg total) by mouth 3 (three) times daily.     Montine Circle, PA-C 11/18/16 [place], Delice Bison, DO 11/18/16 0315

## 2016-11-18 NOTE — ED Triage Notes (Signed)
Pt presents to ED for assessment of left sided back pain, worse with movement and palpation.  States pain started Sunday, worsening, until he could not sleep tonight.  Denies changes in urination, denies changes in bowel.

## 2016-11-19 ENCOUNTER — Encounter (HOSPITAL_COMMUNITY): Payer: Self-pay | Admitting: *Deleted

## 2016-11-19 ENCOUNTER — Emergency Department (HOSPITAL_COMMUNITY): Payer: 59

## 2016-11-19 ENCOUNTER — Emergency Department (HOSPITAL_COMMUNITY)
Admission: EM | Admit: 2016-11-19 | Discharge: 2016-11-19 | Disposition: A | Discharge status: Home / Self Care | Payer: 59 | Attending: Emergency Medicine | Admitting: Emergency Medicine

## 2016-11-19 DIAGNOSIS — M545 Low back pain, unspecified: Secondary | ICD-10-CM

## 2016-11-19 DIAGNOSIS — I129 Hypertensive chronic kidney disease with stage 1 through stage 4 chronic kidney disease, or unspecified chronic kidney disease: Secondary | ICD-10-CM | POA: Diagnosis not present

## 2016-11-19 DIAGNOSIS — R109 Unspecified abdominal pain: Secondary | ICD-10-CM

## 2016-11-19 DIAGNOSIS — Z7984 Long term (current) use of oral hypoglycemic drugs: Secondary | ICD-10-CM | POA: Diagnosis not present

## 2016-11-19 DIAGNOSIS — N189 Chronic kidney disease, unspecified: Secondary | ICD-10-CM | POA: Diagnosis not present

## 2016-11-19 DIAGNOSIS — E1122 Type 2 diabetes mellitus with diabetic chronic kidney disease: Secondary | ICD-10-CM | POA: Diagnosis not present

## 2016-11-19 DIAGNOSIS — Z79899 Other long term (current) drug therapy: Secondary | ICD-10-CM | POA: Insufficient documentation

## 2016-11-19 HISTORY — DX: Obesity, unspecified: E66.9

## 2016-11-19 LAB — URINALYSIS, ROUTINE W REFLEX MICROSCOPIC
BACTERIA UA: NONE SEEN
BILIRUBIN URINE: NEGATIVE
Glucose, UA: NEGATIVE mg/dL
Hgb urine dipstick: NEGATIVE
KETONES UR: NEGATIVE mg/dL
LEUKOCYTES UA: NEGATIVE
Nitrite: NEGATIVE
PH: 5 (ref 5.0–8.0)
PROTEIN: 100 mg/dL — AB
SQUAMOUS EPITHELIAL / LPF: NONE SEEN
Specific Gravity, Urine: 1.009 (ref 1.005–1.030)

## 2016-11-19 LAB — COMPREHENSIVE METABOLIC PANEL
ALBUMIN: 3.5 g/dL (ref 3.5–5.0)
ALK PHOS: 59 U/L (ref 38–126)
ALT: 20 U/L (ref 17–63)
ANION GAP: 11 (ref 5–15)
AST: 15 U/L (ref 15–41)
BILIRUBIN TOTAL: 0.5 mg/dL (ref 0.3–1.2)
BUN: 54 mg/dL — AB (ref 6–20)
CALCIUM: 8.6 mg/dL — AB (ref 8.9–10.3)
CO2: 26 mmol/L (ref 22–32)
Chloride: 100 mmol/L — ABNORMAL LOW (ref 101–111)
Creatinine, Ser: 3.83 mg/dL — ABNORMAL HIGH (ref 0.61–1.24)
GFR calc Af Amer: 19 mL/min — ABNORMAL LOW (ref >60)
GFR calc non Af Amer: 17 mL/min — ABNORMAL LOW (ref >60)
GLUCOSE: 139 mg/dL — AB (ref 65–99)
Potassium: 4.2 mmol/L (ref 3.5–5.1)
SODIUM: 137 mmol/L (ref 135–145)
TOTAL PROTEIN: 6.7 g/dL (ref 6.5–8.1)

## 2016-11-19 LAB — CBC WITH DIFFERENTIAL/PLATELET
BASOS ABS: 0 10*3/uL (ref 0.0–0.1)
BASOS PCT: 0 %
EOS ABS: 0.1 10*3/uL (ref 0.0–0.7)
Eosinophils Relative: 2 %
HEMATOCRIT: 33.3 % — AB (ref 39.0–52.0)
HEMOGLOBIN: 10.7 g/dL — AB (ref 13.0–17.0)
Lymphocytes Relative: 21 %
Lymphs Abs: 1.5 10*3/uL (ref 0.7–4.0)
MCH: 27.2 pg (ref 26.0–34.0)
MCHC: 32.1 g/dL (ref 30.0–36.0)
MCV: 84.7 fL (ref 78.0–100.0)
MONOS PCT: 9 %
Monocytes Absolute: 0.6 10*3/uL (ref 0.1–1.0)
NEUTROS ABS: 5 10*3/uL (ref 1.7–7.7)
NEUTROS PCT: 68 %
Platelets: 220 10*3/uL (ref 150–400)
RBC: 3.93 MIL/uL — AB (ref 4.22–5.81)
RDW: 15.3 % (ref 11.5–15.5)
WBC: 7.3 10*3/uL (ref 4.0–10.5)

## 2016-11-19 LAB — LIPASE, BLOOD: Lipase: 26 U/L (ref 11–51)

## 2016-11-19 MED ORDER — HYDROMORPHONE HCL 1 MG/ML IJ SOLN
1.0000 mg | Freq: Once | INTRAMUSCULAR | Status: AC
  Administered 2016-11-19: 1 mg via INTRAVENOUS
  Filled 2016-11-19: qty 1

## 2016-11-19 MED ORDER — OXYCODONE-ACETAMINOPHEN 5-325 MG PO TABS: 1.0000 | ORAL_TABLET | Freq: Four times a day (QID) | ORAL | 0 refills | Status: DC | PRN

## 2016-11-19 MED ORDER — ONDANSETRON HCL 4 MG/2ML IJ SOLN
4.0000 mg | Freq: Once | INTRAMUSCULAR | Status: AC
  Administered 2016-11-19: 4 mg via INTRAVENOUS
  Filled 2016-11-19: qty 2

## 2016-11-19 NOTE — Discharge Instructions (Signed)
Please read and follow all provided instructions.  Your diagnoses today include:  1. Flank pain   2. Acute left-sided low back pain without sciatica   3. Chronic kidney disease, unspecified CKD stage     Tests performed today include:  Vital signs - see below for your results today  Blood counts and electrolytes - shows elevated kidney function (3.8)  CT scan - no kidney stones or other problems with your abdomen  Medications prescribed:   Percocet (oxycodone/acetaminophen) - narcotic pain medication  DO NOT drive or perform any activities that require you to be awake and alert because this medicine can make you drowsy. BE VERY CAREFUL not to take multiple medicines containing Tylenol (also called acetaminophen). Doing so can lead to an overdose which can damage your liver and cause liver failure and possibly death.  Take any prescribed medications only as directed.  Home care instructions:   Follow any educational materials contained in this packet  Please rest, use ice or heat on your back for the next several days  Do not lift, push, pull anything more than 10 pounds for the next week  Follow-up instructions: Please follow-up with your primary care provider in the next 1 week for further evaluation of your symptoms.   Return instructions:  SEEK IMMEDIATE MEDICAL ATTENTION IF YOU HAVE:  New numbness, tingling, weakness, or problem with the use of your arms or legs  Severe back pain not relieved with medications  Loss control of your bowels or bladder  Increasing pain in any areas of the body (such as chest or abdominal pain)  Shortness of breath, dizziness, or fainting.   Worsening nausea (feeling sick to your stomach), vomiting, fever, or sweats  Any other emergent concerns regarding your health   Additional Information:  Your vital signs today were: BP 135/86    Pulse (!) 59    Temp 97.6 F (36.4 C) (Oral)    Resp 18    SpO2 94%  If your blood pressure  (BP) was elevated above 135/85 this visit, please have this repeated by your doctor within one month. --------------

## 2016-11-19 NOTE — ED Notes (Signed)
Was seen 2 days ago for same and states pain is no better,  Left flank pain cannot lay down it hurts so bad

## 2016-11-19 NOTE — ED Provider Notes (Signed)
[facility] DEPT Provider Note   CSN: [id number] Arrival date & time: 11/19/16  1137  By signing my name below, I, Ephriam Jenkins, attest that this documentation has been prepared under the direction and in the presence of Carlisle Cater PA-C   Electronically Signed: Ephriam Jenkins, ED Scribe. 11/19/16. 1:24 PM.   History   Chief Complaint Chief Complaint  Patient presents with  . Flank Pain  . Back Pain    HPI HPI Comments: [name] is a 51 y.o. male with Hx of obesity, HTN, DM, who presents to the Emergency Department complaining of gradual onset, gradually worsening left sided lower back pain that started three days ago. Pt reports a sharp pain to his lower back that is exacerbated with movement. He reports that the pain radiates circumferentially across his left side and into his left sided abdomen. No radiation of pain to lower extremities. Pt was seen here yesterday for same symptoms and was prescribed Norco and Flexeril. He has taken these medications without relief. Pt spoke with his PCP who told him to present here for imaging. No change in appetite. No change in bowel or bladder habits, no incontinence. No difficulty with urination. He denies dysuria, hematuria. No fever.  The history is provided by the patient. No language interpreter was used.    Past Medical History:  Diagnosis Date  . Diabetes mellitus without complication ([place])   . Hypertension   . Obesity   . Sinus congestion    all the time  . Sleep apnea     There are no active problems to display for this patient.   Past Surgical History:  Procedure Laterality Date  . NASAL SEPTUM SURGERY    . QUADRICEPS TENDON REPAIR  05/13/2012   Procedure: REPAIR QUADRICEP TENDON;  Surgeon: Augustin Schooling, MD;  Location: [facility];  Service: Orthopedics;  Laterality: Right;       Home Medications    Prior to Admission medications   Medication Sig Start Date End Date Taking? Authorizing Provider    amLODipine-valsartan (EXFORGE) 10-320 MG per tablet Take 1 tablet by mouth daily.    [provider]  cephALEXin (KEFLEX) 500 MG capsule Take 1 capsule (500 mg total) by mouth 4 (four) times daily. 11/14/14   Cartner, Marland Kitchen, PA-C  cyclobenzaprine (FLEXERIL) 10 MG tablet Take 1 tablet (10 mg total) by mouth 2 (two) times daily as needed for muscle spasms. 11/18/16   Montine Circle, PA-C  glimepiride (AMARYL) 4 MG tablet Take 4 mg by mouth daily before breakfast.    [provider]  hydrochlorothiazide (HYDRODIURIL) 25 MG tablet Take 25 mg by mouth daily.    [provider]  HYDROcodone-acetaminophen (NORCO/VICODIN) 5-325 MG tablet Take 1-2 tablets by mouth every 6 (six) hours as needed. 11/18/16   Montine Circle, PA-C  ibuprofen (ADVIL,MOTRIN) 800 MG tablet Take 1 tablet (800 mg total) by mouth 3 (three) times daily. 11/18/16   Montine Circle, PA-C  lisinopril (PRINIVIL,ZESTRIL) 40 MG tablet Take 40 mg by mouth 2 (two) times daily.    [provider]  meloxicam (MOBIC) 15 MG tablet Take 15 mg by mouth daily.    [provider]  metFORMIN (GLUCOPHAGE) 1000 MG tablet Take 1,000 mg by mouth 2 (two) times daily with a meal.    [provider]  methocarbamol (ROBAXIN) 500 MG tablet Take 1 tablet (500 mg total) by mouth 3 (three) times daily as needed. 05/13/12   [name], MD  methocarbamol (ROBAXIN) 500  MG tablet Take 1 tablet (500 mg total) by mouth every 6 (six) hours as needed. 05/14/12   Dixon, Robb Matar, PA-C  nebivolol (BYSTOLIC) 10 MG tablet Take 10 mg by mouth 2 (two) times daily.    [provider]  oxyCODONE (OXY IR/ROXICODONE) 5 MG immediate release tablet Take 1-2 tablets (5-10 mg total) by mouth every 3 (three) hours as needed. 05/14/12   Dixon, Robb Matar, PA-C  oxyCODONE-acetaminophen (ROXICET) 5-325 MG per tablet Take 1-2 tablets by mouth every 4 (four) hours as needed for pain. 05/13/12   [name], MD   Phenylephrine-Acetaminophen (TYLENOL SINUS CONGESTION/PAIN PO) Take 1 tablet by mouth every 8 (eight) hours as needed. For congestion/headache    [provider]    Family History History reviewed. No pertinent family history.  Social History Social History  Substance Use Topics  . Smoking status: Never Smoker  . Smokeless tobacco: Never Used  . Alcohol use 3.0 oz/week    5 Cans of beer per week     Comment: Social     Allergies   Patient has no known allergies.   Review of Systems Review of Systems  Constitutional: Negative for fever and unexpected weight change.  HENT: Negative for rhinorrhea and sore throat.   Eyes: Negative for redness.  Respiratory: Negative for cough.   Cardiovascular: Negative for chest pain.  Gastrointestinal: Positive for abdominal pain. Negative for constipation, diarrhea, nausea and vomiting.       Neg for fecal incontinence  Genitourinary: Positive for flank pain. Negative for difficulty urinating, dysuria, frequency, hematuria and urgency.       Negative for urinary incontinence or retention  Musculoskeletal: Positive for back pain. Negative for myalgias.  Skin: Negative for rash.  Neurological: Negative for weakness, numbness and headaches.       Negative for saddle paresthesias      Physical Exam Updated Vital Signs BP 116/67 (BP Location: Left Arm)   Pulse 66   Temp 97.6 F (36.4 C) (Oral)   Resp 18   SpO2 97%   Physical Exam  Constitutional: He appears well-developed and well-nourished.  HENT:  Head: Normocephalic and atraumatic.  Eyes: Conjunctivae are normal. Right eye exhibits no discharge. Left eye exhibits no discharge.  Neck: Normal range of motion. Neck supple.  Cardiovascular: Normal rate, regular rhythm and normal heart sounds.   Pulmonary/Chest: Effort normal and breath sounds normal.  Abdominal: Soft. There is no tenderness.    Musculoskeletal:       Cervical back: He exhibits normal range of motion,  no tenderness and no bony tenderness.       Thoracic back: He exhibits normal range of motion, no tenderness and no bony tenderness.       Lumbar back: He exhibits tenderness. He exhibits normal range of motion and no bony tenderness.       Back:  Neurological: He is alert.  Skin: Skin is warm and dry.  Psychiatric: He has a normal mood and affect.  Nursing note and vitals reviewed.    ED Treatments / Results  DIAGNOSTIC STUDIES: Oxygen Saturation is 97% on RA, normal by my interpretation.  COORDINATION OF CARE: 1:24 PM-Discussed treatment plan with pt at bedside and pt agreed to plan.    3:17 PM Patient and wife informed of results. I inquired about his most recent creatinine. He states he is checked every 3 months and had it checked in the past several weeks and states it was in the 3's. We do  not have records since 2014.   4:17 PM Patient monitored in emergency department. He was informed of all results. Pain is controlled after dose of IV pain medication.  He states that he has 3 Vicodin remaining at home. Will give additional Percocet for pain control.   Patient counseled on use of narcotic pain medications. Counseled not to combine these medications with others containing tylenol. Urged not to drink alcohol, drive, or perform any other activities that requires focus while taking these medications. The patient verbalizes understanding and agrees with the plan.  Patient counseled on proper use of muscle relaxant medication.  They were told not to drink alcohol, drive any vehicle, or do any dangerous activities while taking this medication.  Patient verbalized understanding.  No red flag s/s of low back pain. Patient was counseled on back pain precautions and told to do activity as tolerated but do not lift, push, or pull heavy objects more than 10 pounds for the next week.  Patient counseled to use ice or heat on back for no longer than 15 minutes every hour.   Urged patient not  to drink alcohol, drive, or perform any other activities that requires focus while taking either of these medications.  Patient told to discontinue NSAID medications due to chronic kidney disease.  Patient urged to follow-up with PCP if pain does not improve with treatment and rest or if pain becomes recurrent. Urged to return with worsening severe pain, loss of bowel or bladder control, trouble walking.   The patient verbalizes understanding and agrees with the plan.    Labs (all labs ordered are listed, but only abnormal results are displayed) Labs Reviewed  URINALYSIS, ROUTINE W REFLEX MICROSCOPIC - Abnormal; Notable for the following:       Result Value   Color, Urine STRAW (*)    Protein, ur 100 (*)    All other components within normal limits  CBC WITH DIFFERENTIAL/PLATELET - Abnormal; Notable for the following:    RBC 3.93 (*)    Hemoglobin 10.7 (*)    HCT 33.3 (*)    All other components within normal limits  COMPREHENSIVE METABOLIC PANEL - Abnormal; Notable for the following:    Chloride 100 (*)    Glucose, Bld 139 (*)    BUN 54 (*)    Creatinine, Ser 3.83 (*)    Calcium 8.6 (*)    GFR calc non Af Amer 17 (*)    GFR calc Af Amer 19 (*)    All other components within normal limits  LIPASE, BLOOD    Radiology Ct Renal Stone Study  Result Date: 11/19/2016 CLINICAL DATA:  Left flank pain beginning 4 days ago. EXAM: CT ABDOMEN AND PELVIS WITHOUT CONTRAST TECHNIQUE: Multidetector CT imaging of the abdomen and pelvis was performed following the standard protocol without IV contrast. COMPARISON:  None. FINDINGS: Lower chest: Defect of the left hemidiaphragm allowing some extension of fat into the posterior mediastinum. Mild atelectasis at the left lung base. Right lung bases clear. Hepatobiliary: Normal without contrast. Pancreas: Normal Spleen: Normal Adrenals/Urinary Tract: Adrenal glands are normal. Kidneys are normal. No cyst, mass, stone or hydronephrosis. Ureters are  normal. No stone in the bladder. Stomach/Bowel: Diverticulosis without evidence of diverticulitis. No small bowel abnormality. Vascular/Lymphatic: Normal Reproductive: Normal Other: No free fluid or air. Ventral hernia containing fat and a loop of small intestine without evidence of obstruction. Musculoskeletal: Spinal curvature.  No acute finding. IMPRESSION: No evidence of urinary tract stone disease. Ventral hernia containing  fat and a loop of small intestine. No evidence of obstruction or incarceration. Small defect of the left hemidiaphragm with some herniated fat in the posterior mediastinum. Mild atelectasis the left lung base. Electronically Signed   By: Nelson Chimes M.D.   On: 11/19/2016 14:41    Procedures Procedures (including critical care time)  Medications Ordered in ED Medications  HYDROmorphone (DILAUDID) injection 1 mg (1 mg Intravenous Given 11/19/16 1404)  ondansetron (ZOFRAN) injection 4 mg (4 mg Intravenous Given 11/19/16 1404)     Initial Impression / Assessment and Plan / ED Course  I have reviewed the triage vital signs and the nursing notes.  Pertinent labs & imaging results that were available during my care of the patient were reviewed by me and considered in my medical decision making (see chart for details).     Vital signs reviewed and are as follows: Vitals:   11/19/16 1204  BP: 116/67  Pulse: 66  Resp: 18  Temp: 97.6 F (36.4 C)      Final Clinical Impressions(s) / ED Diagnoses   Final diagnoses:  Flank pain  Acute left-sided low back pain without sciatica  Chronic kidney disease, unspecified CKD stage   Back/flank pain: Patient with lower back pain with radiation to the left flank and left lateral abdomen. This is patient's second visit in 2 days for the same. Labs and imaging performed today to rule out intra-abdominal etiology of pain especially given flank and abdominal tenderness. This is negative. No neurological deficits. Patient is  ambulatory. No warning symptoms of back pain including: fecal incontinence, urinary retention or overflow incontinence, night sweats, waking from sleep with back pain, unexplained fevers or weight loss, h/o cancer, IVDU, recent trauma. No concern for cauda equina, epidural abscess, or other serious cause of back pain. Conservative measures such as rest, ice/heat and pain medicine indicated with PCP follow-up if no improvement with conservative management.   CKD: Creatinine worsened since 2014, however patient reports that his baseline creatinines recently have been in the 3 range. This is consistent with what is found on testing today. Do not suspect acute worsening of chronic kidney disease. Patient told to discontinue ibuprofen.   New Prescriptions New Prescriptions   OXYCODONE-ACETAMINOPHEN (PERCOCET/ROXICET) 5-325 MG TABLET    Take 1-2 tablets by mouth every 6 (six) hours as needed for severe pain.  I personally performed the services described in this documentation, which was scribed in my presence. The recorded information has been reviewed and is accurate.     Carlisle Cater, PA-C 11/19/16 1625    Daleen Bo, MD 11/20/16 1120

## 2016-11-19 NOTE — ED Triage Notes (Signed)
Pt reports being seen here recently for lower back pain, left side flank pain. Was given pain meds but he called pcp today and was told to come back here for imaging to be done. Denies urinary symptoms. Denies injury to back.

## 2016-11-19 NOTE — ED Notes (Signed)
Pt states unable to obtain urine sample at this time. 

## 2017-01-08 ENCOUNTER — Other Ambulatory Visit (HOSPITAL_COMMUNITY): Payer: Self-pay | Admitting: General Surgery

## 2017-01-21 ENCOUNTER — Ambulatory Visit (HOSPITAL_COMMUNITY)
Admission: RE | Admit: 2017-01-21 | Discharge: 2017-01-21 | Disposition: A | Discharge status: Home / Self Care | Payer: 59 | Source: Ambulatory Visit | Attending: General Surgery | Admitting: General Surgery

## 2017-01-21 ENCOUNTER — Ambulatory Visit (HOSPITAL_COMMUNITY): Payer: Managed Care, Other (non HMO)

## 2017-01-21 ENCOUNTER — Other Ambulatory Visit: Payer: Self-pay

## 2017-01-21 DIAGNOSIS — Z6841 Body Mass Index (BMI) 40.0 and over, adult: Secondary | ICD-10-CM | POA: Diagnosis not present

## 2017-01-21 DIAGNOSIS — I517 Cardiomegaly: Secondary | ICD-10-CM | POA: Insufficient documentation

## 2017-01-21 DIAGNOSIS — K219 Gastro-esophageal reflux disease without esophagitis: Secondary | ICD-10-CM | POA: Diagnosis not present

## 2017-01-26 ENCOUNTER — Encounter: Payer: 59 | Attending: General Surgery | Admitting: Registered"

## 2017-01-26 ENCOUNTER — Encounter: Payer: Self-pay | Admitting: Registered"

## 2017-01-26 DIAGNOSIS — Z8249 Family history of ischemic heart disease and other diseases of the circulatory system: Secondary | ICD-10-CM | POA: Diagnosis not present

## 2017-01-26 DIAGNOSIS — M1990 Unspecified osteoarthritis, unspecified site: Secondary | ICD-10-CM | POA: Diagnosis not present

## 2017-01-26 DIAGNOSIS — Z6841 Body Mass Index (BMI) 40.0 and over, adult: Secondary | ICD-10-CM | POA: Insufficient documentation

## 2017-01-26 DIAGNOSIS — Z713 Dietary counseling and surveillance: Secondary | ICD-10-CM | POA: Insufficient documentation

## 2017-01-26 DIAGNOSIS — I10 Essential (primary) hypertension: Secondary | ICD-10-CM | POA: Diagnosis not present

## 2017-01-26 DIAGNOSIS — Z79899 Other long term (current) drug therapy: Secondary | ICD-10-CM | POA: Insufficient documentation

## 2017-01-26 DIAGNOSIS — G4730 Sleep apnea, unspecified: Secondary | ICD-10-CM | POA: Diagnosis not present

## 2017-01-26 DIAGNOSIS — E119 Type 2 diabetes mellitus without complications: Secondary | ICD-10-CM | POA: Insufficient documentation

## 2017-01-26 DIAGNOSIS — Z7984 Long term (current) use of oral hypoglycemic drugs: Secondary | ICD-10-CM | POA: Insufficient documentation

## 2017-01-26 NOTE — Progress Notes (Signed)
Pre-Op Assessment Visit:  Pre-Operative RYGB Surgery  Medical Nutrition Therapy:  Appt start time: 4:00  End time:  4:57  Patient was seen on 01/26/2017 for Pre-Operative Nutrition Assessment. Assessment and letter of approval faxed to [facility] Bariatric Surgery Program coordinator on 01/26/2017.   Pt expectation of surgery: increase energy, improve knee and ankle pain, reduce medications   Pt expectation of Dietitian: none stated  Start weight at [facility]: 338.2 BMI: 51.32   Pt states he attends physical therapy for back. Pt states Bidil helps blood pressure maintained well, but caused significant weight gain. Pt states 2-3x/week: FBS (107-114). Pt is unsure of last A1c value.   Per insurance, pt needs 3 SWL visits prior to surgery.    24 hr Dietary Recall: First Meal: grilled chicken, mixed vegetables Snack: none Second Meal: grilled chicken, mixed vegetables Snack: none Third Meal: grilled chicken, mixed vegetables Snack: none Beverages: Water with [employer] flavor packs, diet soda, coffee (sometimes)  Encouraged to engage in 75 minutes of moderate physical activity including cardiovascular and weight baring weekly  Handouts given during visit include:  . Pre-Op Goals . Bariatric Surgery Protein Shakes . Vitamin and Mineral Recommendations  During the appointment today the following Pre-Op Goals were reviewed with the patient: . Maintain or lose weight as instructed by your surgeon . Make healthy food choices . Begin to limit portion sizes . Limited concentrated sugars and fried foods . Keep fat/sugar in the single digits per serving on          food labels . Practice CHEWING your food  (aim for 30 chews per bite or until applesauce consistency) . Practice not drinking 15 minutes before, during, and 30 minutes after each meal/snack . Avoid all carbonated beverages  . Avoid/limit caffeinated beverages  . Avoid all sugar-sweetened beverages . Avoid  alcohol . Consume 3 meals per day; eat every 3-5 hours . Make a list of non-food related activities . Aim for 64-100 ounces of FLUID daily  . Aim for at least 60-80 grams of PROTEIN daily . Look for a liquid protein source that contain ?15 g protein and ?5 g carbohydrate  (ex: shakes, drinks, shots) . Physical activity is an important part of a healthy lifestyle so keep it moving!  Follow diet recommendations listed below Energy and Macronutrient Recommendations: Calories: 1800 Carbohydrate: 200 Protein: 135 Fat: 50  Demonstrated degree of understanding via:  Teach Back   Teaching Method Utilized:  Visual Auditory Hands on  Barriers to learning/adherence to lifestyle change: none  Patient to call the Nutrition and Diabetes Education Services to enroll in Pre-Op and Post-Op Nutrition Education when surgery date is scheduled.

## 2017-02-24 ENCOUNTER — Telehealth: Payer: Self-pay | Admitting: Cardiovascular Disease

## 2017-02-24 NOTE — Telephone Encounter (Signed)
Received records from [facility] for appointment on 03/31/17 with Dr Oval Linsey.  Records put with Dr Blenda Mounts schedule for 03/31/17. lp

## 2017-02-26 ENCOUNTER — Encounter: Payer: Self-pay | Admitting: Registered"

## 2017-02-26 ENCOUNTER — Encounter: Payer: 59 | Attending: General Surgery | Admitting: Registered"

## 2017-02-26 DIAGNOSIS — Z8249 Family history of ischemic heart disease and other diseases of the circulatory system: Secondary | ICD-10-CM | POA: Insufficient documentation

## 2017-02-26 DIAGNOSIS — Z713 Dietary counseling and surveillance: Secondary | ICD-10-CM | POA: Insufficient documentation

## 2017-02-26 DIAGNOSIS — Z7984 Long term (current) use of oral hypoglycemic drugs: Secondary | ICD-10-CM | POA: Insufficient documentation

## 2017-02-26 DIAGNOSIS — M1990 Unspecified osteoarthritis, unspecified site: Secondary | ICD-10-CM | POA: Insufficient documentation

## 2017-02-26 DIAGNOSIS — G4730 Sleep apnea, unspecified: Secondary | ICD-10-CM | POA: Diagnosis not present

## 2017-02-26 DIAGNOSIS — I10 Essential (primary) hypertension: Secondary | ICD-10-CM | POA: Diagnosis not present

## 2017-02-26 DIAGNOSIS — E119 Type 2 diabetes mellitus without complications: Secondary | ICD-10-CM | POA: Diagnosis not present

## 2017-02-26 DIAGNOSIS — Z6841 Body Mass Index (BMI) 40.0 and over, adult: Secondary | ICD-10-CM | POA: Diagnosis not present

## 2017-02-26 DIAGNOSIS — Z79899 Other long term (current) drug therapy: Secondary | ICD-10-CM | POA: Insufficient documentation

## 2017-02-26 NOTE — Patient Instructions (Addendum)
-   Continue to aim to consume 3 meals a day.  - Continue to aim to chew at least 30 times per bite or to applesauce consistency.  - Aim to consume 64 ounces of fluid a day. Track fluid intake.

## 2017-02-26 NOTE — Progress Notes (Signed)
Appt start time: 5:11 end time: 5:30  Assessment: 1st SWL Appointment.   Start Wt at [facility]: 338.2 Wt: 336.6 BMI: 51.18   Pt arrives with wife having lost 1.6 lbs from previous visit. Pt states he is eating 2-3 meals per day. Pt states he was trying to do liquids to see what it would be like during the first 2 weeks after surgery. Pt states he has been avoiding concentrated sugary foods. Pt states he has been able to chew well on a few occasions but forgets at times.    MEDICATIONS: See list   DIETARY INTAKE:  24-hr recall:  B ( AM): protein shake, fruit or eggs Snk ( AM): none  L ( PM): none Snk ( PM): cashews D ( PM): salad, corn on cobb Snk ( PM): none Beverages: water, diet Coke  Usual physical activity: none stated  Diet to Follow: 1800 calories 200 g carbohydrates 135 g protein 50 g fat  Preferred Learning Style:   No preference indicated   Learning Readiness:   Contemplating  Ready  Change in progress     Nutritional Diagnosis:  [employer]-3.3 Overweight/obesity related to past poor dietary habits and physical inactivity as evidenced by patient w/ planned RYGB surgery following dietary guidelines for continued weight loss.    Intervention:  Nutrition counseling for upcoming Bariatric Surgery.  Goals:  - Aim for 150 minutes of physical activity including cardio and weight bearing every week - Continue to aim to consume 3 meals a day. - Continue to aim to chew at least 30 times per bite or to applesauce consistency. - Aim to consume 64 ounces of fluid a day. Track fluid intake.  Teaching Method Utilized:  Visual Auditory  Handouts given during visit include:  none  Barriers to learning/adherence to lifestyle change: none  Demonstrated degree of understanding via:  Teach Back   Monitoring/Evaluation:  Dietary intake, exercise, and body weight in 1 month(s).

## 2017-03-10 ENCOUNTER — Ambulatory Visit (INDEPENDENT_AMBULATORY_CARE_PROVIDER_SITE_OTHER): Payer: 59 | Admitting: Psychiatry

## 2017-03-10 DIAGNOSIS — F509 Eating disorder, unspecified: Secondary | ICD-10-CM

## 2017-03-13 ENCOUNTER — Other Ambulatory Visit (HOSPITAL_COMMUNITY): Payer: Self-pay | Admitting: *Deleted

## 2017-03-16 ENCOUNTER — Ambulatory Visit (HOSPITAL_COMMUNITY)
Admission: RE | Admit: 2017-03-16 | Discharge: 2017-03-16 | Disposition: A | Discharge status: Home / Self Care | Payer: 59 | Source: Ambulatory Visit | Attending: Nephrology | Admitting: Nephrology

## 2017-03-16 DIAGNOSIS — D631 Anemia in chronic kidney disease: Secondary | ICD-10-CM | POA: Insufficient documentation

## 2017-03-16 MED ORDER — SODIUM CHLORIDE 0.9 % IV SOLN
510.0000 mg | INTRAVENOUS | Status: DC
  Administered 2017-03-16: 510 mg via INTRAVENOUS
  Filled 2017-03-16: qty 17

## 2017-03-23 ENCOUNTER — Ambulatory Visit (HOSPITAL_COMMUNITY)
Admission: RE | Admit: 2017-03-23 | Discharge: 2017-03-23 | Disposition: A | Discharge status: Home / Self Care | Payer: 59 | Source: Ambulatory Visit | Attending: Nephrology | Admitting: Nephrology

## 2017-03-23 DIAGNOSIS — N189 Chronic kidney disease, unspecified: Secondary | ICD-10-CM | POA: Diagnosis not present

## 2017-03-23 DIAGNOSIS — D631 Anemia in chronic kidney disease: Secondary | ICD-10-CM | POA: Insufficient documentation

## 2017-03-23 MED ORDER — SODIUM CHLORIDE 0.9 % IV SOLN
510.0000 mg | INTRAVENOUS | Status: DC
  Administered 2017-03-23: 510 mg via INTRAVENOUS
  Filled 2017-03-23: qty 17

## 2017-03-27 DIAGNOSIS — I10 Essential (primary) hypertension: Secondary | ICD-10-CM | POA: Insufficient documentation

## 2017-03-27 DIAGNOSIS — M549 Dorsalgia, unspecified: Secondary | ICD-10-CM | POA: Insufficient documentation

## 2017-03-27 DIAGNOSIS — E119 Type 2 diabetes mellitus without complications: Secondary | ICD-10-CM | POA: Insufficient documentation

## 2017-03-27 DIAGNOSIS — E669 Obesity, unspecified: Secondary | ICD-10-CM | POA: Insufficient documentation

## 2017-03-31 ENCOUNTER — Encounter (INDEPENDENT_AMBULATORY_CARE_PROVIDER_SITE_OTHER): Payer: Self-pay

## 2017-03-31 ENCOUNTER — Encounter: Payer: Self-pay | Admitting: Cardiovascular Disease

## 2017-03-31 ENCOUNTER — Ambulatory Visit (INDEPENDENT_AMBULATORY_CARE_PROVIDER_SITE_OTHER): Payer: 59 | Admitting: Cardiovascular Disease

## 2017-03-31 VITALS — BP 148/86 | HR 50 | Ht 68.0 in | Wt 338.0 lb

## 2017-03-31 DIAGNOSIS — R0602 Shortness of breath: Secondary | ICD-10-CM | POA: Diagnosis not present

## 2017-03-31 DIAGNOSIS — Z01818 Encounter for other preprocedural examination: Secondary | ICD-10-CM | POA: Diagnosis not present

## 2017-03-31 MED ORDER — CARVEDILOL 12.5 MG PO TABS: 12.5000 mg | ORAL_TABLET | Freq: Two times a day (BID) | ORAL | 5 refills | Status: DC

## 2017-03-31 NOTE — Progress Notes (Signed)
Cardiology Office Note   Date:  04/02/2017   ID:  [name], DOB 13-Jul-1965, MRN [id number]  PCP:  Charolette Forward, MD  Cardiologist:   Skeet Latch, MD   No chief complaint on file.   History of Present Illness: [name] is a 51 y.o. male with hypertension, hyperlipidemia, diabetes, OSA and morbid obesitywho is being seen today for the evaluation of pre-surgical risk assessment at the request of [name], *.  [name] is planning to have gastric bypass surgery and was referred to cardiology for risk assessment.  He doesn't get much exercise due to chronic knee pain.  He is unable to walk up stairs or long distances due to orthopedic issues.  He denies chest pain and has some exertional dyspnea that he attributes to weight and inactivity.  He does some yard work but has to take his time 2/2 dyspnea.  He has occasional mild LE edema after standing long periods of time but no orthopnea or PND.  He has a CPAP that he wears regularly.  He hasn't noted any palpitations, lightheadedness or dizziness.  His BP at home has ranged from 107-140/70s-80s.  He thinks the average is in the 130s.    Past Medical History:  Diagnosis Date  . Diabetes mellitus without complication ([name])   . Hypertension   . Obesity   . Sinus congestion    all the time  . Sleep apnea     Past Surgical History:  Procedure Laterality Date  . NASAL SEPTUM SURGERY    . QUADRICEPS TENDON REPAIR  05/13/2012   Procedure: REPAIR QUADRICEP TENDON;  Surgeon: Augustin Schooling, MD;  Location: [facility];  Service: Orthopedics;  Laterality: Right;     Current Outpatient Medications  Medication Sig Dispense Refill  . amLODipine (NORVASC) 10 MG tablet Take 10 mg by mouth daily.    Marland Kitchen aspirin 81 MG chewable tablet Chew by mouth daily.    . carvedilol (COREG) 12.5 MG tablet Take 1 tablet (12.5 mg total) by mouth 2 (two) times daily. 60 tablet 5  . doxazosin (CARDURA) 4 MG tablet Take 4 mg by mouth daily.     . furosemide (LASIX) 40 MG tablet Take 40 mg by mouth.    Marland Kitchen glimepiride (AMARYL) 4 MG tablet Take 4 mg by mouth daily before breakfast.    . isosorbide-hydrALAZINE (BIDIL) 20-37.5 MG tablet Take by mouth 3 (three) times daily.    . meloxicam (MOBIC) 15 MG tablet Take 15 mg by mouth as needed for pain (and inflammation).     . pioglitazone (ACTOS) 15 MG tablet Take 15 mg by mouth daily.     No current facility-administered medications for this visit.     Allergies:   Patient has no known allergies.    Social History:  The patient  reports that  has never smoked. he has never used smokeless tobacco. He reports that he drinks about 3.0 oz of alcohol per week. He reports that he does not use drugs.   Family History:  The patient's family history includes Cataracts in his father; Hypertension in his mother and other.    ROS:  Please see the history of present illness.   Otherwise, review of systems are positive for none.   All other systems are reviewed and negative.    PHYSICAL EXAM: VS:  BP (!) 148/86   Pulse (!) 50   Ht 5\' 8"  (1.727 m)   Wt (!) 338 lb (153.3  kg)   BMI 51.39 kg/m  , BMI Body mass index is 51.39 kg/m. GENERAL:  Well appearing HEENT:  Pupils equal round and reactive, fundi not visualized, oral mucosa unremarkable NECK:  No jugular venous distention, waveform within normal limits, carotid upstroke brisk and symmetric, no bruits, no thyromegaly LYMPHATICS:  No cervical adenopathy LUNGS:  Clear to auscultation bilaterally HEART:  Bradycardia.  Regular rhythm.  PMI not displaced or sustained,S1 and S2 within normal limits, no S3, no S4, no clicks, no rubs, no murmurs ABD:  Flat, positive bowel sounds normal in frequency in pitch, no bruits, no rebound, no guarding, no midline pulsatile mass, no hepatomegaly, no splenomegaly EXT:  2 plus pulses throughout, no edema, no cyanosis no clubbing SKIN:  No rashes no nodules NEURO:  Cranial nerves II through XII grossly intact,  motor grossly intact throughout PSYCH:  Cognitively intact, oriented to person place and time   EKG:  EKG is ordered today. The ekg ordered today demonstrates sinus bradycardia.  Rate 49 bpm.     Recent Labs: 11/19/2016: ALT 20; BUN 54; Creatinine, Ser 3.83; Hemoglobin 10.7; Platelets 220; Potassium 4.2; Sodium 137    Lipid Panel No results found for: CHOL, TRIG, HDL, CHOLHDL, VLDL, LDLCALC, LDLDIRECT    Wt Readings from Last 3 Encounters:  03/31/17 (!) 338 lb (153.3 kg)  03/23/17 (!) 330 lb (149.7 kg)  03/16/17 (!) 330 lb (149.7 kg)      ASSESSMENT AND PLAN:  # Pre-surgical risk assessment:  Mr. Boeding is very physically active and is limited by shortness of breath.  Therefore it is difficult to assess his surgical risk.  We will get a Lexiscan Myoview to assess for ischemia.  Continue aspirin. We will get a copy of his lipids from his PCP.   # Hypertension: BP elevated here and at home.  We will stop metoprolol and switch to carvedilol 12.5 mg twice daily.  Amlodipine, Bidil, doxazosin and furosemide.      Current medicines are reviewed at length with the patient today.  The patient does not have concerns regarding medicines.  The following changes have been made:  no change  Labs/ tests ordered today include:   Orders Placed This Encounter  Procedures  . MYOCARDIAL PERFUSION IMAGING  . EKG 12-Lead     Disposition:   FU with [name], MD, [facility] in 6 months.  1 month with PharmD.    This note was written with the assistance of speech recognition software.  Please excuse any transcriptional errors.  Signed, [name], MD, [facility]  04/02/2017 10:27 AM    [place]

## 2017-03-31 NOTE — Patient Instructions (Addendum)
Medication Instructions:  STOP METOPROLOL   START CARVEDILOL 12.5 MG TWICE A DAY   Labwork: NONE  Testing/Procedures: Your physician has requested that you have a lexiscan myoview. For further information please visit [email or web address]. Please follow instruction sheet, as given. 2 DAY STUDY   Follow-Up: Your physician recommends that you schedule a follow-up appointment in: [place] physician wants you to follow-up in: [place] will receive a reminder letter in the mail two months in advance. If you don't receive a letter, please call our office to schedule the follow-up appointment.  MONITOR YOUR BLOOD PRESSURE AT HOME AND BRING READINGS AND MACHINE TO FOLLOW UP   If you need a refill on your cardiac medications before your next appointment, please call your pharmacy.

## 2017-04-02 ENCOUNTER — Telehealth (HOSPITAL_COMMUNITY): Payer: Self-pay

## 2017-04-02 ENCOUNTER — Encounter: Payer: Self-pay | Admitting: Cardiovascular Disease

## 2017-04-02 ENCOUNTER — Encounter: Payer: Self-pay | Admitting: Registered"

## 2017-04-02 ENCOUNTER — Encounter: Payer: 59 | Attending: General Surgery | Admitting: Registered"

## 2017-04-02 DIAGNOSIS — I10 Essential (primary) hypertension: Secondary | ICD-10-CM | POA: Diagnosis not present

## 2017-04-02 DIAGNOSIS — E119 Type 2 diabetes mellitus without complications: Secondary | ICD-10-CM

## 2017-04-02 DIAGNOSIS — G4730 Sleep apnea, unspecified: Secondary | ICD-10-CM | POA: Insufficient documentation

## 2017-04-02 DIAGNOSIS — Z79899 Other long term (current) drug therapy: Secondary | ICD-10-CM | POA: Diagnosis not present

## 2017-04-02 DIAGNOSIS — Z8249 Family history of ischemic heart disease and other diseases of the circulatory system: Secondary | ICD-10-CM | POA: Insufficient documentation

## 2017-04-02 DIAGNOSIS — Z6841 Body Mass Index (BMI) 40.0 and over, adult: Secondary | ICD-10-CM | POA: Insufficient documentation

## 2017-04-02 DIAGNOSIS — Z713 Dietary counseling and surveillance: Secondary | ICD-10-CM | POA: Insufficient documentation

## 2017-04-02 DIAGNOSIS — M1990 Unspecified osteoarthritis, unspecified site: Secondary | ICD-10-CM | POA: Insufficient documentation

## 2017-04-02 DIAGNOSIS — Z7984 Long term (current) use of oral hypoglycemic drugs: Secondary | ICD-10-CM | POA: Insufficient documentation

## 2017-04-02 NOTE — Progress Notes (Signed)
Appt start time: 5:11 end time: 5:30  Assessment: 2nd SWL Appointment.   Start Wt at [facility]: 338.2 Wt: 331.1 BMI: 50.34   Pt arrives having lost 5.5 lbs from previous visit.  Pt states he is not working right now therefore eating more of a brunch around 11am and eating about 2 times a day. Pt states he wakes up at 6am to cook breakfast for wife then goes back to bed. Pt states he is working on chewing and it is going ok. Pt states he drinks 96 oz of fluid a day (3 32-ounce bottles) in YETI. Pt states he is checking his BS 2x/week: (107-120s). Pt states it drops sometimes after he skips a meal and tries to eat to bring it back up.   Pt states he has been avoiding concentrated sugary foods.     MEDICATIONS: See list   DIETARY INTAKE:  24-hr recall:  B ( AM): protein shake, fruit or eggs Snk ( AM): none  L ( PM): none Snk ( PM): cashews D ( PM): salad, corn on cobb Snk ( PM): none Beverages: water, diet Coke  Usual physical activity: none stated  Diet to Follow: 1800 calories 200 g carbohydrates 135 g protein 50 g fat  Preferred Learning Style:   No preference indicated   Learning Readiness:   Contemplating  Ready  Change in progress     Nutritional Diagnosis:  [place]-3.3 Overweight/obesity related to past poor dietary habits and physical inactivity as evidenced by patient w/ planned RYGB surgery following dietary guidelines for continued weight loss.    Intervention:  Nutrition counseling for upcoming Bariatric Surgery.  Goals:  - Aim for 150 minutes of physical activity including cardio and weight bearing every week - Continue to work on chewing at least 30 times per bite.  - Aim to not drink 15 minutes before eating, not while eating, and waiting 30 minutes after eating to drink. - Try [employer] bariatric multivitamin.  - Get multivitamin complete with iron and Vitamin D 5000 IU.  - Create a exercise regimen of 15-20 min/day, 4 days/week. Can do arm  exercises when needed.  Teaching Method Utilized:  Visual Auditory  Handouts given during visit include:  Arm exercises  Barriers to learning/adherence to lifestyle change: none  Demonstrated degree of understanding via:  Teach Back   Monitoring/Evaluation:  Dietary intake, exercise, and body weight in 1 month(s).

## 2017-04-02 NOTE — Patient Instructions (Addendum)
-   Continue to work on chewing at least 30 times per bite.   - Aim to not drink 15 minutes before eating, not while eating, and waiting 30 minutes after eating to drink.  - Try [employer] bariatric multivitamin.   - Get multivitamin complete with iron and Vitamin D 5000 IU.   - Create a exercise regimen of 15-20 min/day, 4 days/week. Can do arm exercises when needed.

## 2017-04-02 NOTE — Telephone Encounter (Signed)
Encounter complete. 

## 2017-04-08 ENCOUNTER — Ambulatory Visit (HOSPITAL_COMMUNITY)
Admission: RE | Admit: 2017-04-08 | Discharge: 2017-04-08 | Disposition: A | Discharge status: Home / Self Care | Payer: 59 | Source: Ambulatory Visit | Attending: Cardiology | Admitting: Cardiology

## 2017-04-08 DIAGNOSIS — I10 Essential (primary) hypertension: Secondary | ICD-10-CM | POA: Insufficient documentation

## 2017-04-08 DIAGNOSIS — Z01818 Encounter for other preprocedural examination: Secondary | ICD-10-CM | POA: Insufficient documentation

## 2017-04-08 DIAGNOSIS — G4733 Obstructive sleep apnea (adult) (pediatric): Secondary | ICD-10-CM | POA: Diagnosis not present

## 2017-04-08 DIAGNOSIS — R0602 Shortness of breath: Secondary | ICD-10-CM | POA: Insufficient documentation

## 2017-04-08 DIAGNOSIS — E119 Type 2 diabetes mellitus without complications: Secondary | ICD-10-CM | POA: Diagnosis not present

## 2017-04-08 MED ORDER — REGADENOSON 0.4 MG/5ML IV SOLN
0.4000 mg | Freq: Once | INTRAVENOUS | Status: AC
  Administered 2017-04-08: 0.4 mg via INTRAVENOUS

## 2017-04-08 MED ORDER — TECHNETIUM TC 99M TETROFOSMIN IV KIT
32.1000 | PACK | Freq: Once | INTRAVENOUS | Status: AC | PRN
  Administered 2017-04-08: 32.1 via INTRAVENOUS
  Filled 2017-04-08: qty 33

## 2017-04-09 ENCOUNTER — Ambulatory Visit (HOSPITAL_COMMUNITY)
Admission: RE | Admit: 2017-04-09 | Discharge: 2017-04-09 | Disposition: A | Discharge status: Home / Self Care | Payer: 59 | Source: Ambulatory Visit | Attending: Internal Medicine | Admitting: Internal Medicine

## 2017-04-09 LAB — MYOCARDIAL PERFUSION IMAGING
CHL CUP NUCLEAR SDS: 1
CHL CUP NUCLEAR SRS: 0
CHL CUP NUCLEAR SSS: 1
CHL CUP RESTING HR STRESS: 53 {beats}/min
CSEPPHR: 88 {beats}/min
LV sys vol: 103 mL
LVDIAVOL: 199 mL (ref 62–150)
TID: 1.16

## 2017-04-09 MED ORDER — TECHNETIUM TC 99M TETROFOSMIN IV KIT
31.4000 | PACK | Freq: Once | INTRAVENOUS | Status: AC | PRN
  Administered 2017-04-09: 31.4 via INTRAVENOUS

## 2017-04-14 ENCOUNTER — Ambulatory Visit (INDEPENDENT_AMBULATORY_CARE_PROVIDER_SITE_OTHER): Payer: 59 | Admitting: Psychiatry

## 2017-04-14 DIAGNOSIS — F509 Eating disorder, unspecified: Secondary | ICD-10-CM

## 2017-04-16 ENCOUNTER — Telehealth: Payer: Self-pay | Admitting: *Deleted

## 2017-04-16 DIAGNOSIS — R0602 Shortness of breath: Secondary | ICD-10-CM

## 2017-04-16 DIAGNOSIS — R9430 Abnormal result of cardiovascular function study, unspecified: Secondary | ICD-10-CM

## 2017-04-16 NOTE — Telephone Encounter (Signed)
-----   Message from Skeet Latch, MD sent at 04/13/2017  4:32 PM EST ----- No ischemia but heart muscle looks a little weakend.  Please get an echo to better assess.

## 2017-04-16 NOTE — Telephone Encounter (Signed)
Advised patient and scheduled Echo  

## 2017-04-23 ENCOUNTER — Other Ambulatory Visit: Payer: Self-pay

## 2017-04-23 ENCOUNTER — Ambulatory Visit (HOSPITAL_COMMUNITY): Payer: 59 | Attending: Cardiovascular Disease

## 2017-04-23 DIAGNOSIS — I119 Hypertensive heart disease without heart failure: Secondary | ICD-10-CM | POA: Diagnosis not present

## 2017-04-23 DIAGNOSIS — I340 Nonrheumatic mitral (valve) insufficiency: Secondary | ICD-10-CM | POA: Insufficient documentation

## 2017-04-23 DIAGNOSIS — G4733 Obstructive sleep apnea (adult) (pediatric): Secondary | ICD-10-CM | POA: Insufficient documentation

## 2017-04-23 DIAGNOSIS — E785 Hyperlipidemia, unspecified: Secondary | ICD-10-CM | POA: Insufficient documentation

## 2017-04-23 DIAGNOSIS — R9430 Abnormal result of cardiovascular function study, unspecified: Secondary | ICD-10-CM | POA: Diagnosis not present

## 2017-04-23 DIAGNOSIS — E119 Type 2 diabetes mellitus without complications: Secondary | ICD-10-CM | POA: Diagnosis not present

## 2017-04-23 DIAGNOSIS — Z6841 Body Mass Index (BMI) 40.0 and over, adult: Secondary | ICD-10-CM | POA: Insufficient documentation

## 2017-04-23 DIAGNOSIS — R0600 Dyspnea, unspecified: Secondary | ICD-10-CM | POA: Diagnosis present

## 2017-04-23 DIAGNOSIS — R0602 Shortness of breath: Secondary | ICD-10-CM

## 2017-04-30 ENCOUNTER — Encounter: Payer: Self-pay | Admitting: Registered"

## 2017-04-30 ENCOUNTER — Encounter: Payer: 59 | Attending: General Surgery | Admitting: Registered"

## 2017-04-30 DIAGNOSIS — Z6841 Body Mass Index (BMI) 40.0 and over, adult: Secondary | ICD-10-CM | POA: Diagnosis not present

## 2017-04-30 DIAGNOSIS — Z79899 Other long term (current) drug therapy: Secondary | ICD-10-CM | POA: Insufficient documentation

## 2017-04-30 DIAGNOSIS — E119 Type 2 diabetes mellitus without complications: Secondary | ICD-10-CM | POA: Diagnosis not present

## 2017-04-30 DIAGNOSIS — M1990 Unspecified osteoarthritis, unspecified site: Secondary | ICD-10-CM | POA: Insufficient documentation

## 2017-04-30 DIAGNOSIS — G4730 Sleep apnea, unspecified: Secondary | ICD-10-CM | POA: Insufficient documentation

## 2017-04-30 DIAGNOSIS — Z713 Dietary counseling and surveillance: Secondary | ICD-10-CM | POA: Insufficient documentation

## 2017-04-30 DIAGNOSIS — Z8249 Family history of ischemic heart disease and other diseases of the circulatory system: Secondary | ICD-10-CM | POA: Insufficient documentation

## 2017-04-30 DIAGNOSIS — Z7984 Long term (current) use of oral hypoglycemic drugs: Secondary | ICD-10-CM | POA: Diagnosis not present

## 2017-04-30 DIAGNOSIS — I10 Essential (primary) hypertension: Secondary | ICD-10-CM | POA: Diagnosis not present

## 2017-04-30 NOTE — Patient Instructions (Addendum)
-   Find at least 2 flavors of protein shakes that you enjoy that have at least 15 grams or more of protein and 5 grams or less of carbohydrates.   - Aim to not drink while eating.

## 2017-04-30 NOTE — Progress Notes (Signed)
Appt start time: 3:55 end time: 4:15  Assessment: 3rd SWL Appointment.   Start Wt at N[facility]: 338.2 Wt: 334.2 BMI: 50.81   Pt arrives having gained about 3.1 lbs from previous visit. Pt states he is doing better with chewing well. Pt states he is checking his BS 2x/week: (107-120s). Pt states it drops sometimes after he skips a meal and tries to eat to bring it back up. Pt states he likes [employer] multivitamin and will order them to take after surgery. Pt states he is currently taking a multivitamin and Vitamin D supplement. Pt states he as increased his physical activity to 3-4 times a week. Pt states he does well with not drinking before and after meals, more challenging to not drink while eating; takes a sip to help get the food down.    Pt states he is not working right now therefore eating more of a brunch around 11am and eating about 2 times a day. Pt states he wakes up at 6am to cook breakfast for wife then goes back to bed. Pt states he drinks 96 oz of fluid a day (3 32-ounce bottles) in YETI. Pt states it drops sometimes after he skips a meal and tries to eat to bring it back up.   Pt states he has been avoiding concentrated sugary foods.     MEDICATIONS: See list   DIETARY INTAKE:  24-hr recall:  B ( AM): protein shake, fruit or eggs Snk ( AM): none  L ( PM): none Snk ( PM): cashews D ( PM): salad, corn on cobb Snk ( PM): none Beverages: water, diet Coke  Usual physical activity: calisthenics 3-4x/week, [place], walking  Diet to Follow: 1800 calories 200 g carbohydrates 135 g protein 50 g fat  Preferred Learning Style:   No preference indicated   Learning Readiness:   Contemplating  Ready  Change in progress     Nutritional Diagnosis:  [place]-3.3 Overweight/obesity related to past poor dietary habits and physical inactivity as evidenced by patient w/ planned RYGB surgery following dietary guidelines for continued weight loss.    Intervention:  Nutrition  counseling for upcoming Bariatric Surgery.  Goals:  - Aim for 150 minutes of physical activity including cardio and weight bearing every week - Find at least 2 flavors of protein shakes that you enjoy that have at least 15 grams or more of protein and 5 grams or less of carbohydrates.  - Aim to not drink while eating.  Teaching Method Utilized:  Visual Auditory  Handouts given during visit include:  none  Barriers to learning/adherence to lifestyle change: none  Demonstrated degree of understanding via:  Teach Back   Monitoring/Evaluation:  Dietary intake, exercise, and body weight prn.

## 2017-05-06 NOTE — Progress Notes (Signed)
Patient ID: [name]                 DOB: 13-Nov-1965                      MRN: [id number]     HPI: [name] is a 52 y.o. male referred by Dr. Oval Linsey to HTN clinic. PMH includes hypertension, hyperlipidemia, diabetes, OSA, and morbid obesity. During recent OV on 03/31/2017 with Oval Linsey his metoprolol was change to carvedilol to increase blood pressure control.  Patient pesents today for HTN follow up and denies dizziness, headaches, swelling or significant change in energy level. Noted patient is not on ACEI or ADR therapy at this time with most recent Scr = 3.83 on 11/19/2016. Patient has CKD and follow up with nephrologist.   Current HTN meds:  Amlodipine 10mg  daily Carvedilol 12.5mg  twice daily  Doxazosin 4mg  daily Furosemide 40mg  daily Bidil three times daily  BP goal: 130/80  Family History: cataracts in his father; hypertension in his mother  Social History:  reports that  has never smoked. he has never used smokeless tobacco. He reports that he drinks about 3.0 oz of alcohol per week. He reports that he does not use drugs.   Exercise: none due to chronic knee pain  Home BP readings:  16 readings; average 130/74 BP home monitor = accurate within 10 mmHg from manual reading  Wt Readings from Last 3 Encounters:  04/30/17 (!) 334 lb 3.2 oz (151.6 kg)  04/08/17 (!) 338 lb (153.3 kg)  04/02/17 (!) 331 lb 1.6 oz (150.2 kg)   BP Readings from Last 3 Encounters:  05/07/17 132/80  03/31/17 (!) 148/86  03/23/17 (!) 148/71   Pulse Readings from Last 3 Encounters:  05/07/17 (!) 58  03/31/17 (!) 50  03/23/17 (!) 47    Past Medical History:  Diagnosis Date  . Diabetes mellitus without complication ([place])   . Hypertension   . Obesity   . Sinus congestion    all the time  . Sleep apnea     Current Outpatient Medications on File Prior to Visit  Medication Sig Dispense Refill  . amLODipine (NORVASC) 10 MG tablet Take 10 mg by mouth daily.    Marland Kitchen aspirin 81 MG chewable  tablet Chew by mouth daily.    . carvedilol (COREG) 12.5 MG tablet Take 1 tablet (12.5 mg total) by mouth 2 (two) times daily. 60 tablet 5  . doxazosin (CARDURA) 4 MG tablet Take 4 mg by mouth daily.    . furosemide (LASIX) 40 MG tablet Take 80 mg by mouth. Take 80mg  every morning and 40mg  every evening    . glimepiride (AMARYL) 4 MG tablet Take 4 mg by mouth 2 (two) times daily before a meal.     . isosorbide-hydrALAZINE (BIDIL) 20-37.5 MG tablet Take 2 tablets by mouth 3 (three) times daily.     . pioglitazone (ACTOS) 15 MG tablet Take 15 mg by mouth daily.    . meloxicam (MOBIC) 15 MG tablet Take 15 mg by mouth as needed for pain (and inflammation).      No current facility-administered medications on file prior to visit.     No Known Allergies  Blood pressure 132/80, pulse (!) 58, SpO2 99 %.  Hypertension Patient blood pressure was initially elevated at 144/88, then down to 132/80 after 5 minutes rest at the clinic. Home BP monitor determined to be accurate within 10 mmHg from manual readying with home  BP average reading of 130/74. Pulse remains appropriate as well at 58 bpm and asymptomatic. Will continue current therapy and lifestyle modifications.  Patient to continue follow up with nephrologist as well and follow up with HTN clinic as needed.    Burk Hoctor Rodriguez-Guzman PharmD, BCPS, [place] [id number] 05/10/2017 5:41 PM

## 2017-05-07 ENCOUNTER — Ambulatory Visit (INDEPENDENT_AMBULATORY_CARE_PROVIDER_SITE_OTHER): Payer: 59 | Admitting: Pharmacist

## 2017-05-07 VITALS — BP 132/80 | HR 58

## 2017-05-07 DIAGNOSIS — I10 Essential (primary) hypertension: Secondary | ICD-10-CM | POA: Diagnosis not present

## 2017-05-07 NOTE — Patient Instructions (Signed)
Return for a a follow up appointment as needed  Check your blood pressure at home daily (if able) and keep record of the readings.  Take your BP meds as follows:  **ALL medication as previously prescribed*  Bring all of your meds, your BP cuff and your record of home blood pressures to your next appointment.  Exercise as you're able, try to walk approximately 30 minutes per day.  Keep salt intake to a minimum, especially watch canned and prepared boxed foods.  Eat more fresh fruits and vegetables and fewer canned items.  Avoid eating in fast food restaurants.    HOW TO TAKE YOUR BLOOD PRESSURE: . Rest 5 minutes before taking your blood pressure. .  Don't smoke or drink caffeinated beverages for at least 30 minutes before. . Take your blood pressure before (not after) you eat. . Sit comfortably with your back supported and both feet on the floor (don't cross your legs). . Elevate your arm to heart level on a table or a desk. . Use the proper sized cuff. It should fit smoothly and snugly around your bare upper arm. There should be enough room to slip a fingertip under the cuff. The bottom edge of the cuff should be 1 inch above the crease of the elbow. . Ideally, take 3 measurements at one sitting and record the average.

## 2017-05-10 ENCOUNTER — Encounter: Payer: Self-pay | Admitting: Pharmacist

## 2017-05-10 NOTE — Assessment & Plan Note (Signed)
Patient blood pressure was initially elevated at 144/88, then down to 132/80 after 5 minutes rest at the clinic. Home BP monitor determined to be accurate within 10 mmHg from manual readying with home BP average reading of 130/74. Pulse remains appropriate as well at 58 bpm and asymptomatic. Will continue current therapy and lifestyle modifications.  Patient to continue follow up with nephrologist as well and follow up with HTN clinic as needed.

## 2017-05-11 ENCOUNTER — Encounter: Payer: 59 | Admitting: Skilled Nursing Facility1

## 2017-05-11 DIAGNOSIS — E119 Type 2 diabetes mellitus without complications: Secondary | ICD-10-CM

## 2017-05-13 ENCOUNTER — Encounter: Payer: Self-pay | Admitting: Skilled Nursing Facility1

## 2017-05-13 NOTE — Progress Notes (Signed)
Pre-Operative Nutrition Class:  Appt start time: 5973   End time:  1830.  Patient was seen on 05/11/2017 for Pre-Operative Bariatric Surgery Education [facility].   Surgery date:  Surgery type: RYGB Start weight at [facility]: 338.2 Weight today: 337.5  Samples given per MNT protocol. Patient educated on appropriate usage: Bariatric Advantage Multivitamin Lot # [id number] Exp: 10/2017  Bariatric Advantage Calcium  Lot # [id number] Exp:01-03-18  [place] Protein Shake Lot # [id number] Exp: 02-07-18  The following the learning objectives were met by the patient during this course:  Identify Pre-Op Dietary Goals and will begin 2 weeks pre-operatively  Identify appropriate sources of fluids and proteins   State protein recommendations and appropriate sources pre and post-operatively  Identify Post-Operative Dietary Goals and will follow for 2 weeks post-operatively  Identify appropriate multivitamin and calcium sources  Describe the need for physical activity post-operatively and will follow MD recommendations  State when to call healthcare provider regarding medication questions or post-operative complications  Handouts given during class include:  Pre-Op Bariatric Surgery Diet Handout  Protein Shake Handout  Post-Op Bariatric Surgery Nutrition Handout  BELT Program Information Flyer  Support Group Information Flyer  [facility] Bariatric Supplements Price List  [facility]: [facility] diet advancement per MD.

## 2017-06-19 ENCOUNTER — Other Ambulatory Visit: Payer: Self-pay | Admitting: *Deleted

## 2017-06-19 MED ORDER — CARVEDILOL 12.5 MG PO TABS: 12.5000 mg | ORAL_TABLET | Freq: Two times a day (BID) | ORAL | 1 refills | Status: DC

## 2017-06-25 ENCOUNTER — Telehealth: Payer: Self-pay | Admitting: *Deleted

## 2017-06-25 NOTE — Telephone Encounter (Signed)
-----   Message from Ivor Costa sent at 06/23/2017 12:28 PM EST ----- Regarding: Pre Op Clearance request Please forward a note indicating if [name] is cleared for Laparoscopic Gastric By-Pass/RNY surgery on 07-07-2017.  Thank you,  Ivor Costa, [facility]  [facility] Surgery [phone] Office Phone [phone] Office Fax

## 2017-06-25 NOTE — Telephone Encounter (Signed)
STAFF MESSAGE SENT TO DR [place], WILL FORWARD TO PREOP POOL

## 2017-06-26 NOTE — Telephone Encounter (Signed)
   Primary Cardiologist: Skeet Latch, MD  Chart reviewed as part of pre-operative protocol coverage. Given past medical history and time since last visit, based on ACC/AHA guidelines, [place] Lapage Brock would be at acceptable risk for the planned procedure without further cardiovascular testing.   I will route this recommendation to the requesting party via Epic fax function and remove from pre-op pool.  Please call with questions. Note patient recently had negative myoview and normal echocardiogram. No further workup is needed prior to gastric bypass surgery.   [place], [place] 06/26/2017, 4:40 PM

## 2017-06-26 NOTE — Telephone Encounter (Signed)
Patient aware he is cleared

## 2017-06-29 NOTE — Patient Instructions (Signed)
[name]  06/29/2017   Your procedure is scheduled on: 07/07/2017   Report to [facility] Main  Entrance   ARRIVE AT 530 AM. Have a seat in the Main Lobby. Please note there is a phone at the [employer]. Please call [phone] on that phone. Someone from Short Stay will come and get you from the Main Lobby and take you to Short Stay.  Call this number if you have problems the morning of surgery [phone]   Remember: Do not eat food or drink liquids :After Midnight.     Take these medicines the morning of surgery with A SIP OF WATER: Amlodipine ( NOrvasc), Carvedilol ( coreg), Bidil  DO NOT TAKE ANY DIABETIC MEDICATIONS DAY OF YOUR SURGERY                               You may not have any metal on your body including hair pins and                        Men may shave face and neck.   Do not bring valuables to the hospital. [place].  Contacts, dentures or bridgework may not be worn into surgery.  Leave suitcase in the car. After surgery it may be brought to your room.                       Please read over the following fact sheets you were given: _____________________________________________________________________             [facility] - Preparing for Surgery Before surgery, you can play an important role.  Because skin is not sterile, your skin needs to be as free of germs as possible.  You can reduce the number of germs on your skin by washing with CHG (chlorahexidine gluconate) soap before surgery.  CHG is an antiseptic cleaner which kills germs and bonds with the skin to continue killing germs even after washing. Please DO NOT use if you have an allergy to CHG or antibacterial soaps.  If your skin becomes reddened/irritated stop using the CHG and inform your nurse when you arrive at Short Stay. Do not shave (including legs and underarms) for at least 48 hours prior to the  first CHG shower.  You may shave your face/neck. Please follow these instructions carefully:  1.  Shower with CHG Soap the night before surgery and the  morning of Surgery.  2.  If you choose to wash your hair, wash your hair first as usual with your  normal  shampoo.  3.  After you shampoo, rinse your hair and body thoroughly to remove the  shampoo.                           4.  Use CHG as you would any other liquid soap.  You can apply chg directly  to the skin and wash                       Gently with a scrungie or clean washcloth.  5.  Apply the CHG Soap to your body ONLY FROM  THE NECK DOWN.   Do not use on face/ open                           Wound or open sores. Avoid contact with eyes, ears mouth and genitals (private parts).                       Wash face,  Genitals (private parts) with your normal soap.             6.  Wash thoroughly, paying special attention to the area where your surgery  will be performed.  7.  Thoroughly rinse your body with warm water from the neck down.  8.  DO NOT shower/wash with your normal soap after using and rinsing off  the CHG Soap.                9.  Pat yourself dry with a clean towel.            10.  Wear clean pajamas.            11.  Place clean sheets on your bed the night of your first shower and do not  sleep with pets. Day of Surgery : Do not apply any lotions/deodorants the morning of surgery.  Please wear clean clothes to the hospital/surgery center.  FAILURE TO FOLLOW THESE INSTRUCTIONS MAY RESULT IN THE CANCELLATION OF YOUR SURGERY PATIENT SIGNATURE_________________________________  NURSE SIGNATURE__________________________________  ________________________________________________________________________

## 2017-06-30 ENCOUNTER — Encounter (HOSPITAL_COMMUNITY)
Admission: RE | Admit: 2017-06-30 | Discharge: 2017-06-30 | Disposition: A | Discharge status: Home / Self Care | Payer: 59 | Source: Ambulatory Visit | Attending: General Surgery | Admitting: General Surgery

## 2017-06-30 ENCOUNTER — Other Ambulatory Visit: Payer: Self-pay

## 2017-06-30 ENCOUNTER — Encounter (INDEPENDENT_AMBULATORY_CARE_PROVIDER_SITE_OTHER): Payer: Self-pay

## 2017-06-30 ENCOUNTER — Encounter (HOSPITAL_COMMUNITY): Payer: Self-pay

## 2017-06-30 DIAGNOSIS — Z01812 Encounter for preprocedural laboratory examination: Secondary | ICD-10-CM | POA: Insufficient documentation

## 2017-06-30 HISTORY — DX: Chronic kidney disease, unspecified: N18.9

## 2017-06-30 HISTORY — DX: Anemia, unspecified: D64.9

## 2017-06-30 HISTORY — DX: Unspecified osteoarthritis, unspecified site: M19.90

## 2017-06-30 LAB — GLUCOSE, CAPILLARY: Glucose-Capillary: 189 mg/dL — ABNORMAL HIGH (ref 65–99)

## 2017-06-30 LAB — CBC WITH DIFFERENTIAL/PLATELET
Basophils Absolute: 0 10*3/uL (ref 0.0–0.1)
Basophils Relative: 0 %
EOS PCT: 0 %
Eosinophils Absolute: 0 10*3/uL (ref 0.0–0.7)
HCT: 32.8 % — ABNORMAL LOW (ref 39.0–52.0)
Hemoglobin: 11 g/dL — ABNORMAL LOW (ref 13.0–17.0)
LYMPHS ABS: 0.8 10*3/uL (ref 0.7–4.0)
LYMPHS PCT: 9 %
MCH: 28.5 pg (ref 26.0–34.0)
MCHC: 33.5 g/dL (ref 30.0–36.0)
MCV: 85 fL (ref 78.0–100.0)
MONO ABS: 0.4 10*3/uL (ref 0.1–1.0)
Monocytes Relative: 4 %
Neutro Abs: 8.6 10*3/uL — ABNORMAL HIGH (ref 1.7–7.7)
Neutrophils Relative %: 87 %
PLATELETS: 243 10*3/uL (ref 150–400)
RBC: 3.86 MIL/uL — AB (ref 4.22–5.81)
RDW: 14.9 % (ref 11.5–15.5)
WBC: 9.9 10*3/uL (ref 4.0–10.5)

## 2017-06-30 LAB — COMPREHENSIVE METABOLIC PANEL
ALBUMIN: 3.6 g/dL (ref 3.5–5.0)
ALT: 24 U/L (ref 17–63)
AST: 16 U/L (ref 15–41)
Alkaline Phosphatase: 63 U/L (ref 38–126)
Anion gap: 11 (ref 5–15)
BUN: 62 mg/dL — AB (ref 6–20)
CHLORIDE: 104 mmol/L (ref 101–111)
CO2: 24 mmol/L (ref 22–32)
Calcium: 8.6 mg/dL — ABNORMAL LOW (ref 8.9–10.3)
Creatinine, Ser: 3.51 mg/dL — ABNORMAL HIGH (ref 0.61–1.24)
GFR calc Af Amer: 22 mL/min — ABNORMAL LOW (ref >60)
GFR calc non Af Amer: 19 mL/min — ABNORMAL LOW (ref >60)
GLUCOSE: 196 mg/dL — AB (ref 65–99)
POTASSIUM: 4.2 mmol/L (ref 3.5–5.1)
SODIUM: 139 mmol/L (ref 135–145)
Total Bilirubin: 0.8 mg/dL (ref 0.3–1.2)
Total Protein: 7.3 g/dL (ref 6.5–8.1)

## 2017-06-30 LAB — HEMOGLOBIN A1C
HEMOGLOBIN A1C: 7.2 % — AB (ref 4.8–5.6)
MEAN PLASMA GLUCOSE: 159.94 mg/dL

## 2017-06-30 NOTE — Progress Notes (Addendum)
03/31/17-ekg-epic  CXR-01/21/17-epic  04/08/17-stress Test-epic ECHO-04/23/2017-epic  05/05/2017-LOV- Dr Terrence Dupont on chart  Clearance- Dr Tiffany [place]-06/25/2017-epic - telephone note

## 2017-06-30 NOTE — Progress Notes (Signed)
Need orders in epic.  Surgery on 07/07/2017.  Proep on 06/30/2017 at 0900am.   Thanks.

## 2017-06-30 NOTE — Progress Notes (Signed)
CMP done 06/30/2017 faxed via epic to Dr Kieth Brightly.

## 2017-07-01 ENCOUNTER — Ambulatory Visit: Payer: Self-pay | Admitting: General Surgery

## 2017-07-06 ENCOUNTER — Encounter (HOSPITAL_COMMUNITY): Payer: Self-pay | Admitting: Anesthesiology

## 2017-07-06 NOTE — Anesthesia Preprocedure Evaluation (Addendum)
Anesthesia Evaluation  Patient identified by MRN, date of birth, ID band Patient awake    Reviewed: Allergy & Precautions, NPO status , Patient's Chart, lab work & pertinent test results, reviewed documented beta blocker date and time   Airway Mallampati: III  TM Distance: >3 FB Neck ROM: Full    Dental no notable dental hx. (+) Teeth Intact   Pulmonary sleep apnea and Continuous Positive Airway Pressure Ventilation ,    Pulmonary exam normal breath sounds clear to auscultation       Cardiovascular hypertension, Pt. on medications and Pt. on home beta blockers Normal cardiovascular exam Rhythm:Regular Rate:Normal  Echo- 12/27/2018Left ventricle: The cavity size was normal. There was mild   concentric hypertrophy. Systolic function was normal. The   estimated ejection fraction was in the range of 55% to 60%. Wall   motion was normal; there were no regional wall motion   abnormalities. Features are consistent with a pseudonormal left   ventricular filling pattern, with concomitant abnormal relaxation   and increased filling pressure (grade 2 diastolic dysfunction). - Mitral valve: There was mild regurgitation. - Left atrium: The atrium was mildly dilated.  EKG 04/09/2017 Sinus Bradycardia otherwise normal   Neuro/Psych negative neurological ROS  negative psych ROS   GI/Hepatic negative GI ROS, Neg liver ROS,   Endo/Other  diabetes, Poorly Controlled, Type 2, Oral Hypoglycemic AgentsMorbid obesity  Renal/GU Renal InsufficiencyRenal disease     Musculoskeletal  (+) Arthritis , Osteoarthritis,    Abdominal (+) + obese,   Peds  Hematology  (+) anemia ,   Anesthesia Other Findings   Reproductive/Obstetrics                          Anesthesia Physical Anesthesia Plan  ASA: III  Anesthesia Plan: General   Post-op Pain Management:    Induction: Intravenous  PONV Risk Score and Plan: 4 or  greater and Scopolamine patch - Pre-op, Midazolam, Dexamethasone, Ondansetron and Treatment may vary due to age or medical condition  Airway Management Planned: Oral ETT  Additional Equipment:   Intra-op Plan:   Post-operative Plan: Extubation in OR  Informed Consent: I have reviewed the patients History and Physical, chart, labs and discussed the procedure including the risks, benefits and alternatives for the proposed anesthesia with the patient or authorized representative who has indicated his/her understanding and acceptance.   Dental advisory given  Plan Discussed with: CRNA, Anesthesiologist and Surgeon  Anesthesia Plan Comments:        Anesthesia Quick Evaluation

## 2017-07-07 ENCOUNTER — Encounter (HOSPITAL_COMMUNITY)
Admission: RE | Disposition: A | Discharge status: Home / Self Care | Payer: Self-pay | Source: Ambulatory Visit | Attending: General Surgery

## 2017-07-07 ENCOUNTER — Inpatient Hospital Stay (HOSPITAL_COMMUNITY): Payer: 59 | Admitting: Anesthesiology

## 2017-07-07 ENCOUNTER — Other Ambulatory Visit: Payer: Self-pay

## 2017-07-07 ENCOUNTER — Inpatient Hospital Stay (HOSPITAL_COMMUNITY)
Admission: RE | Admit: 2017-07-07 | Discharge: 2017-07-08 | DRG: 621 | Disposition: A | Discharge status: Home / Self Care | Payer: 59 | Source: Ambulatory Visit | Attending: General Surgery | Admitting: General Surgery

## 2017-07-07 ENCOUNTER — Encounter (HOSPITAL_COMMUNITY): Payer: Self-pay | Admitting: *Deleted

## 2017-07-07 DIAGNOSIS — N189 Chronic kidney disease, unspecified: Secondary | ICD-10-CM | POA: Diagnosis present

## 2017-07-07 DIAGNOSIS — Z7982 Long term (current) use of aspirin: Secondary | ICD-10-CM | POA: Diagnosis not present

## 2017-07-07 DIAGNOSIS — Z6841 Body Mass Index (BMI) 40.0 and over, adult: Secondary | ICD-10-CM | POA: Diagnosis not present

## 2017-07-07 DIAGNOSIS — K429 Umbilical hernia without obstruction or gangrene: Secondary | ICD-10-CM | POA: Diagnosis present

## 2017-07-07 DIAGNOSIS — Z79899 Other long term (current) drug therapy: Secondary | ICD-10-CM

## 2017-07-07 DIAGNOSIS — E1122 Type 2 diabetes mellitus with diabetic chronic kidney disease: Secondary | ICD-10-CM | POA: Diagnosis present

## 2017-07-07 DIAGNOSIS — Z7984 Long term (current) use of oral hypoglycemic drugs: Secondary | ICD-10-CM

## 2017-07-07 DIAGNOSIS — I129 Hypertensive chronic kidney disease with stage 1 through stage 4 chronic kidney disease, or unspecified chronic kidney disease: Secondary | ICD-10-CM | POA: Diagnosis present

## 2017-07-07 DIAGNOSIS — G4730 Sleep apnea, unspecified: Secondary | ICD-10-CM | POA: Diagnosis present

## 2017-07-07 DIAGNOSIS — D631 Anemia in chronic kidney disease: Secondary | ICD-10-CM | POA: Diagnosis present

## 2017-07-07 HISTORY — PX: GASTRIC ROUX-EN-Y: SHX5262

## 2017-07-07 LAB — GLUCOSE, CAPILLARY
GLUCOSE-CAPILLARY: 213 mg/dL — AB (ref 65–99)
Glucose-Capillary: 65 mg/dL (ref 65–99)
Glucose-Capillary: 123 mg/dL — ABNORMAL HIGH (ref 65–99)
Glucose-Capillary: 136 mg/dL — ABNORMAL HIGH (ref 65–99)
Glucose-Capillary: 159 mg/dL — ABNORMAL HIGH (ref 65–99)
Glucose-Capillary: 201 mg/dL — ABNORMAL HIGH (ref 65–99)
Glucose-Capillary: 226 mg/dL — ABNORMAL HIGH (ref 65–99)

## 2017-07-07 LAB — COMPREHENSIVE METABOLIC PANEL
ALK PHOS: 49 U/L (ref 38–126)
ALT: 55 U/L (ref 17–63)
AST: 43 U/L — ABNORMAL HIGH (ref 15–41)
Albumin: 3.4 g/dL — ABNORMAL LOW (ref 3.5–5.0)
Anion gap: 11 (ref 5–15)
BILIRUBIN TOTAL: 0.6 mg/dL (ref 0.3–1.2)
BUN: 56 mg/dL — AB (ref 6–20)
CALCIUM: 7.8 mg/dL — AB (ref 8.9–10.3)
CHLORIDE: 103 mmol/L (ref 101–111)
CO2: 24 mmol/L (ref 22–32)
CREATININE: 3.9 mg/dL — AB (ref 0.61–1.24)
GFR, EST AFRICAN AMERICAN: 19 mL/min — AB (ref >60)
GFR, EST NON AFRICAN AMERICAN: 16 mL/min — AB (ref >60)
Glucose, Bld: 168 mg/dL — ABNORMAL HIGH (ref 65–99)
Potassium: 4.6 mmol/L (ref 3.5–5.1)
Sodium: 138 mmol/L (ref 135–145)
TOTAL PROTEIN: 6.4 g/dL — AB (ref 6.5–8.1)

## 2017-07-07 LAB — CBC WITH DIFFERENTIAL/PLATELET
Basophils Absolute: 0 10*3/uL (ref 0.0–0.1)
Basophils Relative: 1 %
EOS PCT: 2 %
Eosinophils Absolute: 0.1 10*3/uL (ref 0.0–0.7)
HEMATOCRIT: 31.9 % — AB (ref 39.0–52.0)
Hemoglobin: 10.4 g/dL — ABNORMAL LOW (ref 13.0–17.0)
LYMPHS ABS: 0.9 10*3/uL (ref 0.7–4.0)
LYMPHS PCT: 23 %
MCH: 28.3 pg (ref 26.0–34.0)
MCHC: 32.6 g/dL (ref 30.0–36.0)
MCV: 86.7 fL (ref 78.0–100.0)
Monocytes Absolute: 0.2 10*3/uL (ref 0.1–1.0)
Monocytes Relative: 5 %
NEUTROS ABS: 2.7 10*3/uL (ref 1.7–7.7)
Neutrophils Relative %: 69 %
PLATELETS: 196 10*3/uL (ref 150–400)
RBC: 3.68 MIL/uL — AB (ref 4.22–5.81)
RDW: 15.1 % (ref 11.5–15.5)
WBC: 3.9 10*3/uL — AB (ref 4.0–10.5)

## 2017-07-07 LAB — TYPE AND SCREEN
ABO/RH(D): A POS
ANTIBODY SCREEN: NEGATIVE

## 2017-07-07 LAB — ABO/RH: ABO/RH(D): A POS

## 2017-07-07 SURGERY — LAPAROSCOPIC ROUX-EN-Y GASTRIC BYPASS WITH UPPER ENDOSCOPY
Anesthesia: General | Site: Abdomen

## 2017-07-07 MED ORDER — ACETAMINOPHEN 10 MG/ML IV SOLN
INTRAVENOUS | Status: AC
  Filled 2017-07-07: qty 100

## 2017-07-07 MED ORDER — OXYCODONE HCL 5 MG/5ML PO SOLN
5.0000 mg | ORAL | Status: DC | PRN
  Administered 2017-07-07: 5 mg via ORAL
  Filled 2017-07-07: qty 5

## 2017-07-07 MED ORDER — SUCCINYLCHOLINE CHLORIDE 20 MG/ML IJ SOLN
INTRAMUSCULAR | Status: DC | PRN
  Administered 2017-07-07: 140 mg via INTRAVENOUS

## 2017-07-07 MED ORDER — CEFOTETAN DISODIUM-DEXTROSE 2-2.08 GM-%(50ML) IV SOLR
2.0000 g | INTRAVENOUS | Status: AC
  Administered 2017-07-07: 2 g via INTRAVENOUS
  Filled 2017-07-07: qty 50

## 2017-07-07 MED ORDER — SUGAMMADEX SODIUM 200 MG/2ML IV SOLN
INTRAVENOUS | Status: DC | PRN
  Administered 2017-07-07: 300 mg via INTRAVENOUS

## 2017-07-07 MED ORDER — EPHEDRINE 5 MG/ML INJ
INTRAVENOUS | Status: AC
  Filled 2017-07-07: qty 10

## 2017-07-07 MED ORDER — FENTANYL CITRATE (PF) 250 MCG/5ML IJ SOLN
INTRAMUSCULAR | Status: AC
  Filled 2017-07-07: qty 5

## 2017-07-07 MED ORDER — BUPIVACAINE HCL (PF) 0.25 % IJ SOLN
INTRAMUSCULAR | Status: AC
  Filled 2017-07-07: qty 30

## 2017-07-07 MED ORDER — ONDANSETRON HCL 4 MG/2ML IJ SOLN
INTRAMUSCULAR | Status: AC
  Filled 2017-07-07: qty 2

## 2017-07-07 MED ORDER — LACTATED RINGERS IV SOLN
INTRAVENOUS | Status: DC | PRN
  Administered 2017-07-07: 08:00:00 via INTRAVENOUS

## 2017-07-07 MED ORDER — ACETAMINOPHEN 500 MG PO TABS
1000.0000 mg | ORAL_TABLET | ORAL | Status: AC
  Administered 2017-07-07: 1000 mg via ORAL
  Filled 2017-07-07: qty 2

## 2017-07-07 MED ORDER — SCOPOLAMINE 1 MG/3DAYS TD PT72
1.0000 | MEDICATED_PATCH | TRANSDERMAL | Status: DC
  Administered 2017-07-07: 1.5 mg via TRANSDERMAL
  Filled 2017-07-07: qty 1

## 2017-07-07 MED ORDER — BUPIVACAINE LIPOSOME 1.3 % IJ SUSP
20.0000 mL | Freq: Once | INTRAMUSCULAR | Status: AC
  Administered 2017-07-07: 20 mL
  Filled 2017-07-07: qty 20

## 2017-07-07 MED ORDER — MIDAZOLAM HCL 2 MG/2ML IJ SOLN
INTRAMUSCULAR | Status: AC
  Filled 2017-07-07: qty 2

## 2017-07-07 MED ORDER — SUCCINYLCHOLINE CHLORIDE 200 MG/10ML IV SOSY
PREFILLED_SYRINGE | INTRAVENOUS | Status: AC
  Filled 2017-07-07: qty 10

## 2017-07-07 MED ORDER — ONDANSETRON HCL 4 MG/2ML IJ SOLN
INTRAMUSCULAR | Status: DC | PRN
  Administered 2017-07-07: 4 mg via INTRAVENOUS

## 2017-07-07 MED ORDER — EPHEDRINE SULFATE-NACL 50-0.9 MG/10ML-% IV SOSY
PREFILLED_SYRINGE | INTRAVENOUS | Status: DC | PRN
  Administered 2017-07-07 (×2): 10 mg via INTRAVENOUS

## 2017-07-07 MED ORDER — ACETAMINOPHEN 10 MG/ML IV SOLN
INTRAVENOUS | Status: DC | PRN
  Administered 2017-07-07: 1000 mg via INTRAVENOUS

## 2017-07-07 MED ORDER — LIP MEDEX EX OINT
TOPICAL_OINTMENT | CUTANEOUS | Status: AC
  Filled 2017-07-07: qty 7

## 2017-07-07 MED ORDER — DOXAZOSIN MESYLATE 4 MG PO TABS
4.0000 mg | ORAL_TABLET | Freq: Every day | ORAL | Status: DC
  Administered 2017-07-07: 4 mg via ORAL
  Filled 2017-07-07: qty 2
  Filled 2017-07-07: qty 1

## 2017-07-07 MED ORDER — TRAMADOL HCL 50 MG PO TABS: 50.0000 mg | ORAL_TABLET | Freq: Four times a day (QID) | ORAL | Status: DC | PRN

## 2017-07-07 MED ORDER — INSULIN ASPART 100 UNIT/ML ~~LOC~~ SOLN: 0.0000 [IU] | Freq: Three times a day (TID) | SUBCUTANEOUS | Status: DC

## 2017-07-07 MED ORDER — PROPOFOL 500 MG/50ML IV EMUL
INTRAVENOUS | Status: DC | PRN
  Administered 2017-07-07: 50 ug/kg/min via INTRAVENOUS

## 2017-07-07 MED ORDER — ALBUMIN HUMAN 5 % IV SOLN
INTRAVENOUS | Status: DC | PRN
  Administered 2017-07-07: 09:00:00 via INTRAVENOUS

## 2017-07-07 MED ORDER — ROCURONIUM BROMIDE 100 MG/10ML IV SOLN
INTRAVENOUS | Status: DC | PRN
  Administered 2017-07-07: 10 mg via INTRAVENOUS
  Administered 2017-07-07: 60 mg via INTRAVENOUS
  Administered 2017-07-07: 20 mg via INTRAVENOUS

## 2017-07-07 MED ORDER — ACETAMINOPHEN 160 MG/5ML PO SOLN
650.0000 mg | Freq: Four times a day (QID) | ORAL | Status: DC
  Administered 2017-07-07 – 2017-07-08 (×4): 650 mg via ORAL
  Filled 2017-07-07 (×4): qty 20.3

## 2017-07-07 MED ORDER — HEPARIN SODIUM (PORCINE) 5000 UNIT/ML IJ SOLN
5000.0000 [IU] | INTRAMUSCULAR | Status: AC
  Administered 2017-07-07: 5000 [IU] via SUBCUTANEOUS
  Filled 2017-07-07: qty 1

## 2017-07-07 MED ORDER — HYDRALAZINE HCL 20 MG/ML IJ SOLN: 10.0000 mg | INTRAMUSCULAR | Status: DC | PRN

## 2017-07-07 MED ORDER — HYDROMORPHONE HCL 1 MG/ML IJ SOLN: 0.2500 mg | INTRAMUSCULAR | Status: DC | PRN

## 2017-07-07 MED ORDER — GLYCOPYRROLATE 0.2 MG/ML IV SOSY
PREFILLED_SYRINGE | INTRAVENOUS | Status: AC
  Filled 2017-07-07: qty 5

## 2017-07-07 MED ORDER — DEXTROSE 50 % IV SOLN
INTRAVENOUS | Status: AC
  Administered 2017-07-07: 25 mL
  Filled 2017-07-07: qty 50

## 2017-07-07 MED ORDER — SODIUM CHLORIDE 0.9 % IV SOLN
INTRAVENOUS | Status: DC
  Administered 2017-07-07: 15:00:00 via INTRAVENOUS

## 2017-07-07 MED ORDER — 0.9 % SODIUM CHLORIDE (POUR BTL) OPTIME
TOPICAL | Status: DC | PRN
  Administered 2017-07-07: 1000 mL

## 2017-07-07 MED ORDER — DEXAMETHASONE SODIUM PHOSPHATE 10 MG/ML IJ SOLN
INTRAMUSCULAR | Status: AC
  Filled 2017-07-07: qty 1

## 2017-07-07 MED ORDER — BUPIVACAINE HCL 0.25 % IJ SOLN
INTRAMUSCULAR | Status: DC | PRN
  Administered 2017-07-07: 20 mL

## 2017-07-07 MED ORDER — ENOXAPARIN SODIUM 30 MG/0.3ML ~~LOC~~ SOLN
30.0000 mg | Freq: Two times a day (BID) | SUBCUTANEOUS | Status: DC
  Administered 2017-07-07 – 2017-07-08 (×2): 30 mg via SUBCUTANEOUS
  Filled 2017-07-07 (×2): qty 0.3

## 2017-07-07 MED ORDER — ONDANSETRON HCL 4 MG/2ML IJ SOLN: 4.0000 mg | Freq: Once | INTRAMUSCULAR | Status: DC | PRN

## 2017-07-07 MED ORDER — MORPHINE SULFATE (PF) 4 MG/ML IV SOLN: 1.0000 mg | INTRAVENOUS | Status: DC | PRN

## 2017-07-07 MED ORDER — SIMETHICONE 80 MG PO CHEW: 80.0000 mg | CHEWABLE_TABLET | Freq: Four times a day (QID) | ORAL | Status: DC | PRN

## 2017-07-07 MED ORDER — LIDOCAINE 2% (20 MG/ML) 5 ML SYRINGE
INTRAMUSCULAR | Status: AC
  Filled 2017-07-07: qty 5

## 2017-07-07 MED ORDER — GABAPENTIN 300 MG PO CAPS
300.0000 mg | ORAL_CAPSULE | ORAL | Status: AC
  Administered 2017-07-07: 300 mg via ORAL
  Filled 2017-07-07: qty 1

## 2017-07-07 MED ORDER — FENTANYL CITRATE (PF) 100 MCG/2ML IJ SOLN
INTRAMUSCULAR | Status: DC | PRN
  Administered 2017-07-07: 150 ug via INTRAVENOUS
  Administered 2017-07-07 (×2): 50 ug via INTRAVENOUS
  Administered 2017-07-07: 100 ug via INTRAVENOUS
  Administered 2017-07-07 (×3): 50 ug via INTRAVENOUS

## 2017-07-07 MED ORDER — GLYCOPYRROLATE 0.2 MG/ML IJ SOLN
INTRAMUSCULAR | Status: DC | PRN
  Administered 2017-07-07: 0.2 mg via INTRAVENOUS

## 2017-07-07 MED ORDER — ONDANSETRON HCL 4 MG/2ML IJ SOLN: 4.0000 mg | INTRAMUSCULAR | Status: DC | PRN

## 2017-07-07 MED ORDER — INSULIN ASPART 100 UNIT/ML ~~LOC~~ SOLN
0.0000 [IU] | Freq: Every day | SUBCUTANEOUS | Status: DC
  Administered 2017-07-07: 2 [IU] via SUBCUTANEOUS

## 2017-07-07 MED ORDER — SUGAMMADEX SODIUM 200 MG/2ML IV SOLN
INTRAVENOUS | Status: AC
  Filled 2017-07-07: qty 2

## 2017-07-07 MED ORDER — ROCURONIUM BROMIDE 10 MG/ML (PF) SYRINGE
PREFILLED_SYRINGE | INTRAVENOUS | Status: AC
  Filled 2017-07-07: qty 5

## 2017-07-07 MED ORDER — ONDANSETRON HCL 4 MG/2ML IJ SOLN
4.0000 mg | Freq: Once | INTRAMUSCULAR | Status: AC
  Administered 2017-07-07: 4 mg via INTRAVENOUS

## 2017-07-07 MED ORDER — APREPITANT 40 MG PO CAPS
40.0000 mg | ORAL_CAPSULE | ORAL | Status: AC
  Administered 2017-07-07: 40 mg via ORAL
  Filled 2017-07-07: qty 1

## 2017-07-07 MED ORDER — PREMIER PROTEIN SHAKE
2.0000 [oz_av] | ORAL | Status: DC
  Administered 2017-07-08 (×5): 2 [oz_av] via ORAL

## 2017-07-07 MED ORDER — GABAPENTIN 250 MG/5ML PO SOLN
200.0000 mg | Freq: Two times a day (BID) | ORAL | Status: DC
  Administered 2017-07-07 – 2017-07-08 (×3): 200 mg via ORAL
  Filled 2017-07-07 (×3): qty 5

## 2017-07-07 MED ORDER — PROPOFOL 10 MG/ML IV BOLUS
INTRAVENOUS | Status: AC
  Filled 2017-07-07: qty 40

## 2017-07-07 MED ORDER — LACTATED RINGERS IR SOLN
Status: DC | PRN
  Administered 2017-07-07: 1000 mL

## 2017-07-07 MED ORDER — DEXAMETHASONE SODIUM PHOSPHATE 4 MG/ML IJ SOLN
4.0000 mg | INTRAMUSCULAR | Status: AC
  Administered 2017-07-07: 10 mg via INTRAVENOUS

## 2017-07-07 MED ORDER — SODIUM CHLORIDE 0.9 % IV SOLN
INTRAVENOUS | Status: DC | PRN
  Administered 2017-07-07 (×4): via INTRAVENOUS

## 2017-07-07 MED ORDER — SODIUM CHLORIDE 0.9 % IV SOLN
INTRAVENOUS | Status: DC
  Administered 2017-07-07 (×2): via INTRAVENOUS

## 2017-07-07 MED ORDER — CHLORHEXIDINE GLUCONATE 4 % EX LIQD: 60.0000 mL | Freq: Once | CUTANEOUS | Status: DC

## 2017-07-07 MED ORDER — INSULIN ASPART 100 UNIT/ML ~~LOC~~ SOLN
0.0000 [IU] | Freq: Three times a day (TID) | SUBCUTANEOUS | Status: DC
  Administered 2017-07-07: 5 [IU] via SUBCUTANEOUS
  Administered 2017-07-08: 2 [IU] via SUBCUTANEOUS

## 2017-07-07 MED ORDER — MEPERIDINE HCL 50 MG/ML IJ SOLN: 6.2500 mg | INTRAMUSCULAR | Status: DC | PRN

## 2017-07-07 MED ORDER — PROPOFOL 10 MG/ML IV BOLUS
INTRAVENOUS | Status: DC | PRN
  Administered 2017-07-07: 220 mg via INTRAVENOUS

## 2017-07-07 MED ORDER — CARVEDILOL 12.5 MG PO TABS
12.5000 mg | ORAL_TABLET | Freq: Two times a day (BID) | ORAL | Status: DC
  Administered 2017-07-07 – 2017-07-08 (×2): 12.5 mg via ORAL
  Filled 2017-07-07 (×2): qty 1

## 2017-07-07 MED ORDER — PANTOPRAZOLE SODIUM 40 MG IV SOLR
40.0000 mg | Freq: Every day | INTRAVENOUS | Status: DC
  Administered 2017-07-07: 40 mg via INTRAVENOUS
  Filled 2017-07-07: qty 40

## 2017-07-07 MED ORDER — MIDAZOLAM HCL 5 MG/5ML IJ SOLN
INTRAMUSCULAR | Status: DC | PRN
  Administered 2017-07-07: 2 mg via INTRAVENOUS

## 2017-07-07 SURGICAL SUPPLY — 73 items
APL SKNCLS STERI-STRIP NONHPOA (GAUZE/BANDAGES/DRESSINGS) ×1
APPLIER CLIP 5 13 M/L LIGAMAX5 (MISCELLANEOUS)
APPLIER CLIP ROT 10 11.4 M/L (STAPLE)
APPLIER CLIP ROT 13.4 12 LRG (CLIP)
APR CLP LRG 13.4X12 ROT 20 MLT (CLIP)
APR CLP MED LRG 11.4X10 (STAPLE)
APR CLP MED LRG 5 ANG JAW (MISCELLANEOUS)
BANDAGE ADH SHEER 1  50/CT (GAUZE/BANDAGES/DRESSINGS) IMPLANT
BENZOIN TINCTURE PRP APPL 2/3 (GAUZE/BANDAGES/DRESSINGS) ×1 IMPLANT
BLADE SURG SZ11 CARB STEEL (BLADE) ×2 IMPLANT
CABLE HIGH FREQUENCY MONO STRZ (ELECTRODE) ×2 IMPLANT
CHLORAPREP W/TINT 26ML (MISCELLANEOUS) ×2 IMPLANT
CLIP APPLIE 5 13 M/L LIGAMAX5 (MISCELLANEOUS) IMPLANT
CLIP APPLIE ROT 10 11.4 M/L (STAPLE) IMPLANT
CLIP APPLIE ROT 13.4 12 LRG (CLIP) IMPLANT
COVER SURGICAL LIGHT HANDLE (MISCELLANEOUS) ×2 IMPLANT
DEVICE SUTURE ENDOST 10MM (ENDOMECHANICALS) ×2 IMPLANT
DRAIN CHANNEL 19F RND (DRAIN) IMPLANT
DRAIN PENROSE 18X1/4 LTX STRL (WOUND CARE) ×2 IMPLANT
ELECT L-HOOK LAP 45CM DISP (ELECTROSURGICAL) ×2
ELECT PENCIL ROCKER SW 15FT (MISCELLANEOUS) ×2 IMPLANT
ELECTRODE L-HOOK LAP 45CM DISP (ELECTROSURGICAL) ×1 IMPLANT
EVACUATOR SILICONE 100CC (DRAIN) IMPLANT
GAUZE SPONGE 4X4 12PLY STRL (GAUZE/BANDAGES/DRESSINGS) IMPLANT
GAUZE SPONGE 4X4 16PLY XRAY LF (GAUZE/BANDAGES/DRESSINGS) ×2 IMPLANT
GLOVE BIOGEL PI IND STRL 7.0 (GLOVE) ×1 IMPLANT
GLOVE BIOGEL PI INDICATOR 7.0 (GLOVE) ×1
GLOVE SURG SS PI 7.0 STRL IVOR (GLOVE) ×2 IMPLANT
GOWN STRL REUS W/TWL LRG LVL3 (GOWN DISPOSABLE) ×2 IMPLANT
GOWN STRL REUS W/TWL XL LVL3 (GOWN DISPOSABLE) ×6 IMPLANT
GRASPER SUT TROCAR 14GX15 (MISCELLANEOUS) IMPLANT
HANDLE STAPLE EGIA 4 XL (STAPLE) ×2 IMPLANT
HOVERMATT SINGLE USE (MISCELLANEOUS) ×2 IMPLANT
KIT BASIN OR (CUSTOM PROCEDURE TRAY) ×2 IMPLANT
KIT GASTRIC LAVAGE 34FR ADT (SET/KITS/TRAYS/PACK) IMPLANT
MARKER SKIN DUAL TIP RULER LAB (MISCELLANEOUS) ×2 IMPLANT
NDL SPNL 22GX3.5 QUINCKE BK (NEEDLE) ×1 IMPLANT
NEEDLE SPNL 22GX3.5 QUINCKE BK (NEEDLE) ×2 IMPLANT
PACK CARDIOVASCULAR III (CUSTOM PROCEDURE TRAY) ×2 IMPLANT
RELOAD EGIA 45 MED/THCK PURPLE (STAPLE) ×3 IMPLANT
RELOAD EGIA 45 TAN VASC (STAPLE) IMPLANT
RELOAD EGIA 60 MED/THCK PURPLE (STAPLE) ×8 IMPLANT
RELOAD EGIA 60 TAN VASC (STAPLE) ×6 IMPLANT
RELOAD ENDO STITCH 2.0 (ENDOMECHANICALS) ×20
RELOAD STAPLE 60 MED/THCK ART (STAPLE) ×4 IMPLANT
RELOAD SUT SNGL STCH ABSRB 2-0 (ENDOMECHANICALS) ×5 IMPLANT
RELOAD SUT SNGL STCH BLK 2-0 (ENDOMECHANICALS) ×5 IMPLANT
SCISSORS LAP 5X45 EPIX DISP (ENDOMECHANICALS) ×2 IMPLANT
SHEARS HARMONIC ACE PLUS 45CM (MISCELLANEOUS) ×2 IMPLANT
SLEEVE XCEL OPT CAN 5 100 (ENDOMECHANICALS) ×6 IMPLANT
SOLUTION ANTI FOG 6CC (MISCELLANEOUS) ×2 IMPLANT
STAPLER VISISTAT 35W (STAPLE) IMPLANT
STRIP CLOSURE SKIN 1/2X4 (GAUZE/BANDAGES/DRESSINGS) ×1 IMPLANT
SUT ETHILON 2 0 PS N (SUTURE) IMPLANT
SUT MNCRL AB 4-0 PS2 18 (SUTURE) ×2 IMPLANT
SUT RELOAD ENDO STITCH 2 48X1 (ENDOMECHANICALS) ×5
SUT RELOAD ENDO STITCH 2.0 (ENDOMECHANICALS) ×5
SUT SILK 0 SH 30 (SUTURE) IMPLANT
SUT VICRYL 0 TIES 12 18 (SUTURE) IMPLANT
SUT VICRYL 0 UR6 27IN ABS (SUTURE) ×1 IMPLANT
SUTURE RELOAD END STTCH 2 48X1 (ENDOMECHANICALS) ×5 IMPLANT
SUTURE RELOAD ENDO STITCH 2.0 (ENDOMECHANICALS) ×5 IMPLANT
SYR 20CC LL (SYRINGE) ×2 IMPLANT
SYR 50ML LL SCALE MARK (SYRINGE) ×2 IMPLANT
TOWEL OR 17X26 10 PK STRL BLUE (TOWEL DISPOSABLE) ×2 IMPLANT
TOWEL OR NON WOVEN STRL DISP B (DISPOSABLE) ×2 IMPLANT
TRAY FOLEY W/METER SILVER 14FR (SET/KITS/TRAYS/PACK) IMPLANT
TRAY FOLEY W/METER SILVER 16FR (SET/KITS/TRAYS/PACK) IMPLANT
TROCAR BLADELESS OPT 5 100 (ENDOMECHANICALS) ×2 IMPLANT
TROCAR XCEL 12X100 BLDLESS (ENDOMECHANICALS) ×2 IMPLANT
TUBING CONNECTING 10 (TUBING) ×2 IMPLANT
TUBING ENDO SMARTCAP PENTAX (MISCELLANEOUS) ×2 IMPLANT
TUBING INSUF HEATED (TUBING) ×2 IMPLANT

## 2017-07-07 NOTE — Progress Notes (Signed)
Discussed post op day goals with patient including ambulation, IS, diet progression, pain, and nausea control.  Questions answered. 

## 2017-07-07 NOTE — Progress Notes (Signed)
Pt. set up with CPAP, auto set >16/<6 humidifier filled, pts. own hose & full face mask and is currently on room air.

## 2017-07-07 NOTE — Progress Notes (Signed)
RT placed pt. On CPAP at this time.

## 2017-07-07 NOTE — Transfer of Care (Signed)
Immediate Anesthesia Transfer of Care Note  Patient: [name]  Procedure(s) Performed: LAPAROSCOPIC ROUX-EN-Y GASTRIC BYPASS WITH UPPER ENDOSCOPY (N/A Abdomen)  Patient Location: PACU  Anesthesia Type:General  Level of Consciousness: awake, alert , oriented and patient cooperative  Airway & Oxygen Therapy: Patient Spontanous Breathing and Patient connected to face mask oxygen  Post-op Assessment: Report given to RN, Post -op Vital signs reviewed and stable and Patient moving all extremities X 4  Post vital signs: stable  Last Vitals:  Vitals:   07/07/17 0554 07/07/17 1032  BP: 127/71 (P) 119/71  Pulse: 66 (P) 62  Resp: 18 (P) 12  Temp: 36.5 C (!) (P) 36.4 C  SpO2: 96% (P) 100%    Last Pain:  Vitals:   07/07/17 0554  TempSrc: Oral      Patients Stated Pain Goal: 3 (16/01/09 3235)  Complications: No apparent anesthesia complications

## 2017-07-07 NOTE — Anesthesia Procedure Notes (Signed)
Procedure Name: Intubation Date/Time: 07/07/2017 7:30 AM Performed by: Lissa Morales, CRNA Pre-anesthesia Checklist: Patient identified, Emergency Drugs available, Suction available and Patient being monitored Patient Re-evaluated:Patient Re-evaluated prior to induction Oxygen Delivery Method: Circle system utilized Preoxygenation: Pre-oxygenation with 100% oxygen Induction Type: IV induction Ventilation: Mask ventilation without difficulty Laryngoscope Size: Glidescope, Mac and 4 Grade View: Grade III Tube type: Oral Tube size: 8.0 mm Number of attempts: 1 Airway Equipment and Method: Stylet and Oral airway Placement Confirmation: ETT inserted through vocal cords under direct vision,  positive ETCO2 and breath sounds checked- equal and bilateral Secured at: 22.5 cm Tube secured with: Tape Dental Injury: Teeth and Oropharynx as per pre-operative assessment  Difficulty Due To: Difficulty was anticipated, Difficult Airway- due to anterior larynx, Difficult Airway- due to large tongue and Difficult Airway- due to reduced neck mobility Comments: Elective glidescope,very large tongue,needed cricoid with glidescope,atraumatic

## 2017-07-07 NOTE — Op Note (Signed)
Preoperative diagnosis: Roux-en-Y gastric bypass  Postoperative diagnosis: Same   Procedure: Upper endoscopy   Surgeon: Clovis Riley, M.D.  Anesthesia: Gen.   Indications for procedure: This patient was undergoing a Roux-en-Y gastric bypass.   Description of procedure: The endoscopy was placed in the mouth and into the oropharynx and under endoscopic vision it was advanced to the esophagogastric junction. The pouch was insufflated and no bleeding or bubbles were seen. The anastamosis was patent and hemostatic. No bleeding or leaks were detected. The scope was withdrawn without difficulty.   Clovis Riley, M.D. General, Bariatric, & Minimally Invasive Surgery [facility]

## 2017-07-07 NOTE — H&P (Signed)
[name] is an 52 y.o. male.   Chief Complaint: obesity HPI: 52 yo male with long history of obesity, diabetes, sleep apnea. He has completed all requirements and is ready to proceed with gastric bypass.  Past Medical History:  Diagnosis Date  . Anemia    iron infusions in last few months for anemia   . Arthritis    ankles   . Chronic kidney disease    followed by Dr Arty Baumgartner   . Diabetes mellitus without complication ([place])    type II   . Hypertension   . Obesity   . Sinus congestion    all the time  . Sleep apnea    cpap off and on     Past Surgical History:  Procedure Laterality Date  . NASAL SEPTUM SURGERY    . QUADRICEPS TENDON REPAIR  05/13/2012   Procedure: REPAIR QUADRICEP TENDON;  Surgeon: Augustin Schooling, MD;  Location: [facility];  Service: Orthopedics;  Laterality: Right;    Family History  Problem Relation Age of Onset  . Hypertension Other   . Hypertension Mother   . Cataracts Father    Social History:  reports that  has never smoked. he has never used smokeless tobacco. He reports that he drinks about 3.0 oz of alcohol per week. He reports that he does not use drugs.  Allergies: No Known Allergies  Medications Prior to Admission  Medication Sig Dispense Refill  . acetaminophen (TYLENOL) 500 MG tablet Take 1,000 mg by mouth every 6 (six) hours as needed (for pain.).    Marland Kitchen amLODipine (NORVASC) 10 MG tablet Take 10 mg by mouth daily.    Marland Kitchen aspirin 81 MG chewable tablet Chew 81 mg by mouth daily.     . carvedilol (COREG) 12.5 MG tablet Take 1 tablet (12.5 mg total) by mouth 2 (two) times daily. 180 tablet 1  . doxazosin (CARDURA) 4 MG tablet Take 4 mg by mouth at bedtime.     . furosemide (LASIX) 40 MG tablet Take 40-80 mg by mouth 2 (two) times daily. Take 80mg  every morning and 40mg  every evening    . glimepiride (AMARYL) 4 MG tablet Take 4 mg by mouth 2 (two) times daily before a meal.     . isosorbide-hydrALAZINE (BIDIL) 20-37.5 MG tablet Take 2 tablets by  mouth 3 (three) times daily.     . meloxicam (MOBIC) 15 MG tablet Take 15 mg by mouth as needed for pain (and inflammation).     . pioglitazone (ACTOS) 15 MG tablet Take 15 mg by mouth daily.      Results for orders placed or performed during the hospital encounter of 07/07/17 (from the past 48 hour(s))  Glucose, capillary     Status: None   Collection Time: 07/07/17  5:45 AM  Result Value Ref Range   Glucose-Capillary 65 65 - 99 mg/dL  Type and screen     Status: None   Collection Time: 07/07/17  6:00 AM  Result Value Ref Range   ABO/RH(D) A POS    Antibody Screen NEG    Sample Expiration      07/10/2017 Performed at [facility], [place] [address], [place], [place] [address]   Glucose, capillary     Status: Abnormal   Collection Time: 07/07/17  6:38 AM  Result Value Ref Range   Glucose-Capillary 123 (H) 65 - 99 mg/dL   No results found.  Review of Systems  Constitutional: Negative for chills and fever.  HENT: Negative for hearing loss.   Eyes: Negative for blurred vision and double vision.  Respiratory: Negative for cough and hemoptysis.   Cardiovascular: Negative for chest pain and palpitations.  Gastrointestinal: Negative for abdominal pain, nausea and vomiting.  Genitourinary: Negative for dysuria and urgency.  Musculoskeletal: Negative for myalgias and neck pain.  Skin: Negative for itching and rash.  Neurological: Negative for dizziness, tingling and headaches.  Endo/Heme/Allergies: Does not bruise/bleed easily.  Psychiatric/Behavioral: Negative for depression and suicidal ideas.    Blood pressure 127/71, pulse 66, temperature 97.7 F (36.5 C), temperature source Oral, resp. rate 18, height 5\' 8"  (1.727 m), weight (!) 147 kg (324 lb), SpO2 96 %. Physical Exam  Vitals reviewed. Constitutional: He is oriented to person, place, and time. He appears well-developed and well-nourished.  HENT:  Head: Normocephalic and atraumatic.  Eyes: Conjunctivae and  EOM are normal. Pupils are equal, round, and reactive to light.  Neck: Normal range of motion. Neck supple.  Cardiovascular: Normal rate and regular rhythm.  Respiratory: Effort normal and breath sounds normal.  GI: Soft. Bowel sounds are normal. He exhibits no distension. There is no tenderness.  Musculoskeletal: Normal range of motion.  Neurological: He is alert and oriented to person, place, and time.  Skin: Skin is warm and dry.  Psychiatric: He has a normal mood and affect. His behavior is normal.     Assessment/Plan 52 yo male with obesity and diabetes -lap RNY gastric bypass -ERAS protocol -inpatient admission  Mickeal Skinner, MD 07/07/2017, 7:02 AM

## 2017-07-07 NOTE — Op Note (Signed)
Preop Diagnosis: Obesity Class III  Postop Diagnosis: same  Procedure performed: laparoscopic Roux en Y gastric bypass  Assitant: Romana Juniper  Indications:  The patient is a 52 y.o. year-old morbidly obese male who has been followed in the Bariatric Clinic as an outpatient. This patient was diagnosed with morbid obesity with a BMI of Body mass index is 49.26 kg/m. and significant co-morbidities including hypertension, insulin dependent diabetes and sleep apnea.  The patient was counseled extensively in the Bariatric Outpatient Clinic and after a thorough explanation of the risks and benefits of surgery (including death from complications, bowel leak, infection such as peritonitis and/or sepsis, internal hernia, bleeding, need for blood transfusion, bowel obstruction, organ failure, pulmonary embolus, deep venous thrombosis, wound infection, incisional hernia, skin breakdown, and others entailed on the consent form) and after a compliant diet and exercise program, the patient was scheduled for an elective laparoscopic sleeve gastrectomy.  Description of Operation:  Following informed consent, the patient was taken to the operating room and placed on the operating table in the supine position.  He had previously received prophylactic antibiotics and subcutaneous heparin for DVT prophylaxis in the pre-op holding area.  After induction of general endotracheal anesthesia by the anesthesiologist, the patient underwent placement of sequential compression devices, Foley catheter and an oro-gastric tube.  A timeout was confirmed by the surgery and anesthesia teams.  The patient was adequately padded at all pressure points and placed on a footboard to prevent slippage from the OR table during extremes of position during surgery.  He underwent a routine sterile prep and drape of her entire abdomen.    Next, A transverse incision was made under the left subcostal area and a 67mm optical viewing trocar was  introduced into the peritoneal cavity. Pneumoperitoneum was applied with a high flow and low pressure. A laparoscope was inserted to confirm placement. A extraperitoneal block was then placed at the lateral abdominal wall using exparel diluted with marcaine . 5 additional trocars were placed: 1 37mm trocar to the left of the midline. 1 additional 47mm trocar in the left lateral area, 1 48mm trocar in the right mid abdomen, and 1 95mm trocar in the right subcostal area.  There was an umbilical hernia present with omentum adhesed to the sac. Due to the need to flip the omentum, the omentum was dissected free with harmonic scalpel and a 10cm portion was removed from the abdomen.  The greater omentum was flipped over the transverse colon and under the left lobe of the liver. The ligament of trietz was identified. 40cm of jejunum was measured starting from the ligament of Trietz. The mesentery was checked to ensure mobility. Next, a 69mm 2-53mm tristapler was used to divide the jejunum at this location. The harmonic scalpel was used to divide the mesentery down to the origin. A 1/2" penrose was sutured to the distal side. 100cm of jejunum was measured starting at the division. 2-0 silk was used to appose the biliary limb to the 100cm mark of jejunum in 2 places. Enterotomies were made in the biliary and common channels and a 53mm 2-3 tristapler was used to create the J-J anastomosis. A 2-0 silk was used to appose the enterotomy edges and a 654mm 2-3 tristapler was used to close the enterotomy. An anti-obstruction 2-0 silk suture was placed. Next, the mesenteric defect was closed with a 2-0 silk in running fashion.The J-J appeared patent and in neutral position.  Next, the omentum was divided using the Harmonic scalpel. The  patient was placed in steep Reverse Trendelenberg position. A Nathanson retracted was placed through a subxiphoid incision and used to retract the liver. The fat pad over the fundus was incised to  free the fundus. Next, a position along the lesser curve 6cm from GE junction was identified. The pars flaccida was entered and the fat over the lesser curve divided to enter the lesser sac. Multiple 33mm 3-53mm tristaple firings were peformed to create a 6cm pouch. The Roux limb was identified using the placed penrose and brought up to the stomach in antecolic fashion. The limb was inspected to ensure a neutral position. A 2-0 vicryl suture was then used to create a posterior layer connecting the stomach to the Roux limb jejunum in running fashion. Next cautery was used to create an enterotomy along the medial aspect of this suture line and Harmonic scalpel used to create gastotomy. A 15mm 3-73mm tristapler was then used to create a 25-55mm anastomosis. 2 2-0 vicryl sutures were used in running fashion to close the gastrotomy. Finally, a 2-0 vicryl suture was used to close an anterior layer of stomach and jejunum over the anastomosis in running fashion. The penrose was removed from the Roux limb. A 2-0 vicryl was used to appose the transverse mesocolon to the mesentery of the Roux limb.   The assistant then went and performed an upper endoscopy and leak test. No bubbles were seen and the pouch and limb distended appropriately. The limb and pouch were deflated, the endoscope was removed. Hemostasis was ensured. Pneumoperitoneum was evacuated, all ports were removed and all incisions closed with 4-0 monocryl suture in subcuticular fashion. Steristrips and bandaids were put in place for dressing. The patient awoke from anesthesia and was brought to pacu in stable condition. All counts were correct.  Specimens:  None  Local Anesthesia: 50 ml Exparel: 0.5% Marcaine Mix  Post-Op Plan:       Pain Management: PO, prn      Antibiotics: Prophylactic      Anticoagulation: Prophylactic, Starting now      Post Op Studies/Consults: Not applicable      Intended Discharge: within 48h      Intended Outpatient  Follow-Up: Two Week      Intended Outpatient Studies: Not Applicable      Other: Not Applicable   [name]

## 2017-07-07 NOTE — Discharge Instructions (Signed)
° ° ° °GASTRIC BYPASS/SLEEVE ° Home Care Instructions ° ° These instructions are to help you care for yourself when you go home. ° °Call: If you have any problems. °• Call [phone] and ask for the surgeon on call °• If you need immediate help, come to the ER at [facility].  °• Tell the ER staff that you are a new post-op gastric bypass or gastric sleeve patient °  °Signs and symptoms to report: • Severe vomiting or nausea °o If you cannot keep down clear liquids for longer than 1 day, call your surgeon  °• Abdominal pain that does not get better after taking your pain medication °• Fever over 100.4° F with chills °• Heart beating over 100 beats a minute °• Shortness of breath at rest °• Chest pain °•  Redness, swelling, drainage, or foul odor at incision (surgical) sites °•  If your incisions open or pull apart °• Swelling or pain in calf (lower leg) °• Diarrhea (Loose bowel movements that happen often), frequent watery, uncontrolled bowel movements °• Constipation, (no bowel movements for 3 days) if this happens: Pick one °o Milk of Magnesia, 2 tablespoons by mouth, 3 times a day for 2 days if needed °o Stop taking Milk of Magnesia once you have a bowel movement °o Call your doctor if constipation continues °Or °o Miralax  (instead of Milk of Magnesia) following the label instructions °o Stop taking Miralax once you have a bowel movement °o Call your doctor if constipation continues °• Anything you think is not normal °  °Normal side effects after surgery: • Unable to sleep at night or unable to focus °• Irritability or moody °• Being tearful (crying) or depressed °These are common complaints, possibly related to your anesthesia medications that put you to sleep, stress of surgery, and change in lifestyle.  This usually goes away a few weeks after surgery.  If these feelings continue, call your primary care doctor. °  °Wound Care: You may have surgical glue, steri-strips, or staples over your incisions after  surgery °• Surgical glue:  Looks like a clear film over your incisions and will wear off a little at a time °• Steri-strips: Strips of tape over your incisions. You may notice a yellowish color on the skin under the steri-strips. This is used to make the   steri-strips stick better. Do not pull the steri-strips off - let them fall off °• Staples: Staples may be removed before you leave the hospital °o If you go home with staples, call [facility], [phone] at for an appointment with your surgeon’s nurse to have staples removed 10 days after surgery. °• Showering: You may shower two (2) days after your surgery unless your surgeon tells you differently °o Wash gently around incisions with warm soapy water, rinse well, and gently pat dry  °o No tub baths until staples are removed, steri-strips fall off or glue is gone.  °  °Medications: • Medications should be liquid or crushed if larger than the size of a dime °• Extended release pills (medication that release a little bit at a time through the day) should NOT be crushed or cut. (examples include XL, ER, DR, SR) °• Depending on the size and number of medications you take, you may need to space (take a few throughout the day)/change the time you take your medications so that you do not over-fill your pouch (smaller stomach) °• Make sure you follow-up with your primary care doctor to   make medication changes needed during rapid weight loss and life-style changes °• If you have diabetes, follow up with the doctor that orders your diabetes medication(s) within one week after surgery and check your blood sugar regularly. °• Do not drive while taking prescription pain medication  °• It is ok to take Tylenol by the bottle instructions with your pain medicine or instead of your pain medicine as needed.  DO NOT TAKE NSAIDS (EXAMPLES OF NSAIDS:  IBUPROFREN/ NAPROXEN)  °Diet:                    First 2 Weeks ° You will see the dietician t about two (2) weeks  after your surgery. The dietician will increase the types of foods you can eat if you are handling liquids well: °• If you have severe vomiting or nausea and cannot keep down clear liquids lasting longer than 1 day, call your surgeon @ ([phone]) °Protein Shake °• Drink at least 2 ounces of shake 5-6 times per day °• Each serving of protein shakes (usually 8 - 12 ounces) should have: °o 15 grams of protein  °o And no more than 5 grams of carbohydrate  °• Goal for protein each day: °o Men = 80 grams per day °o Women = 60 grams per day °• Protein powder may be added to fluids such as non-fat milk or Lactaid milk or unsweetened Soy/Almond milk (limit to 35 grams added protein powder per serving) ° °Hydration °• Slowly increase the amount of water and other clear liquids as tolerated (See Acceptable Fluids) °• Slowly increase the amount of protein shake as tolerated  °•  Sip fluids slowly and throughout the day.  Do not use straws. °• May use sugar substitutes in small amounts (no more than 6 - 8 packets per day; i.e. Splenda) ° °Fluid Goal °• The first goal is to drink at least 8 ounces of protein shake/drink per day (or as directed by the nutritionist); some examples of protein shakes are Syntrax Nectar, Adkins Advantage, EAS Edge HP, and Unjury. See handout from pre-op Bariatric Education Class: °o Slowly increase the amount of protein shake you drink as tolerated °o You may find it easier to slowly sip shakes throughout the day °o It is important to get your proteins in first °• Your fluid goal is to drink 64 - 100 ounces of fluid daily °o It may take a few weeks to build up to this °• 32 oz (or more) should be clear liquids  °And  °• 32 oz (or more) should be full liquids (see below for examples) °• Liquids should not contain sugar, caffeine, or carbonation ° °Clear Liquids: °• Water or Sugar-free flavored water (i.e. Fruit H2O, Propel) °• Decaffeinated coffee or tea (sugar-free) °• Crystal Lite, Wyler’s Lite,  Minute Maid Lite °• Sugar-free Jell-O °• Bouillon or broth °• Sugar-free Popsicle:   *Less than 20 calories each; Limit 1 per day ° °Full Liquids: °Protein Shakes/Drinks + 2 choices per day of other full liquids °• Full liquids must be: °o No More Than 15 grams of Carbs per serving  °o No More Than 3 grams of Fat per serving °• Strained low-fat cream soup (except Cream of Potato or Tomato) °• Non-Fat milk °• Fat-free Lactaid Milk °• Unsweetened Soy Or Unsweetened Almond Milk °• Low Sugar yogurt (Dannon Lite & Fit, Greek yogurt; Oikos Triple Zero; Chobani Simply 100; Yoplait 100 calorie Greek - No Fruit on the Bottom) ° °  °Vitamins   and Minerals • Start 1 day after surgery unless otherwise directed by your surgeon °• 2 Chewable Bariatric Specific Multivitamin / Multimineral Supplement with iron (Example: Bariatric Advantage Multi EA) °• Chewable Calcium with Vitamin D-3 °(Example: 3 Chewable Calcium Plus 600 with Vitamin D-3) °o Take 500 mg three (3) times a day for a total of 1500 mg each day °o Do not take all 3 doses of calcium at one time as it may cause constipation, and you can only absorb 500 mg  at a time  °o Do not mix multivitamins containing iron with calcium supplements; take 2 hours apart °• Menstruating women and those with a history of anemia (a blood disease that causes weakness) may need extra iron °o Talk with your doctor to see if you need more iron °• Do not stop taking or change any vitamins or minerals until you talk to your dietitian or surgeon °• Your Dietitian and/or surgeon must approve all vitamin and mineral supplements °  °Activity and Exercise: Limit your physical activity as instructed by your doctor.  It is important to continue walking at home.  During this time, use these guidelines: °• Do not lift anything greater than ten (10) pounds for at least two (2) weeks °• Do not go back to work or drive until your surgeon says you can °• You may have sex when you feel comfortable  °o It is  VERY important for male patients to use a reliable birth control method; fertility often increases after surgery  °o All hormonal birth control will be ineffective for 30 days after surgery due to medications given during surgery a barrier method must be used. °o Do not get pregnant for at least 18 months °• Start exercising as soon as your doctor tells you that you can °o Make sure your doctor approves any physical activity °• Start with a simple walking program °• Walk 5-15 minutes each day, 7 days per week.  °• Slowly increase until you are walking 30-45 minutes per day °Consider joining our BELT program. [phone] or email [email or web address] °  °Special Instructions Things to remember: °• Use your CPAP when sleeping if this applies to you ° °• [facility] has two free Bariatric Surgery Support Groups that meet monthly °o The 3rd Thursday of each month, 6 pm, [facility]  °o The 2nd Friday of each month, 11:45 am in the private dining room in the basement of [facility] °• It is very important to keep all follow up appointments with your surgeon, dietitian, primary care physician, and behavioral health practitioner °• Routine follow up schedule with your surgeon include appointments at 2-3 weeks, 6-8 weeks, 6 months, and 1 year at a minimum.  Your surgeon may request to see you more often.   °o After the first year, please follow up with your bariatric surgeon and dietitian at least once a year in order to maintain best weight loss results °[facility]: [phone] °[facility]: [phone] °Bariatric Nurse Coordinator: [phone] °  °   Reviewed and Endorsed  °by [facility] Patient Education Committee, June, 2016 °Edits Approved: Aug, 2018 ° ° ° °

## 2017-07-08 LAB — GLUCOSE, CAPILLARY
Glucose-Capillary: 144 mg/dL — ABNORMAL HIGH (ref 65–99)
Glucose-Capillary: 90 mg/dL (ref 65–99)

## 2017-07-08 LAB — COMPREHENSIVE METABOLIC PANEL
ALT: 61 U/L (ref 17–63)
AST: 45 U/L — ABNORMAL HIGH (ref 15–41)
Albumin: 3.4 g/dL — ABNORMAL LOW (ref 3.5–5.0)
Alkaline Phosphatase: 51 U/L (ref 38–126)
Anion gap: 10 (ref 5–15)
BUN: 51 mg/dL — ABNORMAL HIGH (ref 6–20)
CHLORIDE: 106 mmol/L (ref 101–111)
CO2: 23 mmol/L (ref 22–32)
CREATININE: 3.55 mg/dL — AB (ref 0.61–1.24)
Calcium: 8.3 mg/dL — ABNORMAL LOW (ref 8.9–10.3)
GFR, EST AFRICAN AMERICAN: 21 mL/min — AB (ref >60)
GFR, EST NON AFRICAN AMERICAN: 18 mL/min — AB (ref >60)
Glucose, Bld: 161 mg/dL — ABNORMAL HIGH (ref 65–99)
Potassium: 4.7 mmol/L (ref 3.5–5.1)
SODIUM: 139 mmol/L (ref 135–145)
Total Bilirubin: 0.7 mg/dL (ref 0.3–1.2)
Total Protein: 7 g/dL (ref 6.5–8.1)

## 2017-07-08 LAB — CBC WITH DIFFERENTIAL/PLATELET
Basophils Absolute: 0 10*3/uL (ref 0.0–0.1)
Basophils Relative: 0 %
EOS ABS: 0 10*3/uL (ref 0.0–0.7)
EOS PCT: 0 %
HCT: 34 % — ABNORMAL LOW (ref 39.0–52.0)
Hemoglobin: 11.1 g/dL — ABNORMAL LOW (ref 13.0–17.0)
LYMPHS ABS: 0.8 10*3/uL (ref 0.7–4.0)
LYMPHS PCT: 11 %
MCH: 28.2 pg (ref 26.0–34.0)
MCHC: 32.6 g/dL (ref 30.0–36.0)
MCV: 86.5 fL (ref 78.0–100.0)
MONO ABS: 0.5 10*3/uL (ref 0.1–1.0)
MONOS PCT: 7 %
Neutro Abs: 6.2 10*3/uL (ref 1.7–7.7)
Neutrophils Relative %: 82 %
PLATELETS: 213 10*3/uL (ref 150–400)
RBC: 3.93 MIL/uL — AB (ref 4.22–5.81)
RDW: 15 % (ref 11.5–15.5)
WBC: 7.5 10*3/uL (ref 4.0–10.5)

## 2017-07-08 MED ORDER — TRAMADOL HCL 50 MG PO TABS: 50.0000 mg | ORAL_TABLET | Freq: Four times a day (QID) | ORAL | Status: DC | PRN

## 2017-07-08 MED ORDER — PANTOPRAZOLE SODIUM 40 MG PO TBEC: 40.0000 mg | DELAYED_RELEASE_TABLET | Freq: Every day | ORAL | 1 refills | Status: DC

## 2017-07-08 MED ORDER — ASPIRIN 81 MG PO CHEW: 81.0000 mg | CHEWABLE_TABLET | Freq: Every day | ORAL | Status: DC

## 2017-07-08 MED ORDER — TRAMADOL HCL 50 MG PO TABS: 50.0000 mg | ORAL_TABLET | Freq: Three times a day (TID) | ORAL | 0 refills | Status: DC | PRN

## 2017-07-08 MED ORDER — ONDANSETRON 4 MG PO TBDP: 4.0000 mg | ORAL_TABLET | Freq: Three times a day (TID) | ORAL | 0 refills | Status: DC | PRN

## 2017-07-08 NOTE — Discharge Summary (Addendum)
Physician Discharge Summary  [name] GEX:[id number] DOB: November 28, 1965 DOA: 07/07/2017  PCP: Charolette Forward, MD  Admit date: 07/07/2017 Discharge date: 07/08/2017  Recommendations for Outpatient Follow-up:  1.  (include homehealth, outpatient follow-up instructions, specific recommendations for PCP to follow-up on, etc.)  Follow-up Information    Larissa Pegg, [name], MD. Go on 07/30/2017.   Specialty:  General Surgery Why:  at 2 pm Contact information: [place] [address] [phone]        Arieh Bogue, [name], MD Follow up.   Specialty:  General Surgery Contact information: [place] [address] [phone]          Discharge Diagnoses:  Active Problems:   Morbid obesity [facility])   Surgical Procedure: Laparoscopic Sleeve Gastrectomy, upper endoscopy  Discharge Condition: Good Disposition: Home  Diet recommendation: Postoperative sleeve gastrectomy diet (liquids only)  Filed Weights   07/07/17 0554 07/08/17 0500  Weight: (!) 147 kg (324 lb) (!) 145.6 kg (321 lb)     Hospital Course:  The patient was admitted after undergoing Roux-en-Y gastric bypass. POD 0 he ambulated well. POD 1 he was started on the water diet protocol and tolerated 1000 ml in the first shift. Once meeting the water amount he was advanced to bariatric protein shakes which they tolerated and were discharged home POD 1.  Treatments: surgery: Roux-en-Y gastric bypass  Discharge Instructions  Discharge Instructions    Ambulate hourly while awake   Complete by:  As directed    Call MD for:  difficulty breathing, headache or visual disturbances   Complete by:  As directed    Call MD for:  persistant dizziness or light-headedness   Complete by:  As directed    Call MD for:  persistant nausea and vomiting   Complete by:  As directed    Call MD for:  redness, tenderness, or signs of infection (pain, swelling, redness, odor or  green/yellow discharge around incision site)   Complete by:  As directed    Call MD for:  severe uncontrolled pain   Complete by:  As directed    Call MD for:  temperature >101 F   Complete by:  As directed    Diet bariatric full liquid   Complete by:  As directed    Discharge wound care:   Complete by:  As directed    Remove Bandaids tomorrow, ok to shower tomorrow. Steristrips may fall off in 1-3 weeks.   Incentive spirometry   Complete by:  As directed    Perform hourly while awake     Allergies as of 07/08/2017   No Known Allergies     Medication List    STOP taking these medications   furosemide 40 MG tablet Commonly known as:  LASIX   glimepiride 4 MG tablet Commonly known as:  AMARYL   meloxicam 15 MG tablet Commonly known as:  MOBIC   pioglitazone 15 MG tablet Commonly known as:  ACTOS     TAKE these medications   acetaminophen 500 MG tablet Commonly known as:  TYLENOL Take 1,000 mg by mouth every 6 (six) hours as needed (for pain.).   amLODipine 10 MG tablet Commonly known as:  NORVASC Take 10 mg by mouth daily. Notes to patient:  Monitor Blood Pressure Daily and keep a log for primary care physician.  You may need to make changes to your medications with rapid weight loss.     aspirin  81 MG chewable tablet Chew 1 tablet (81 mg total) by mouth daily. Start taking on:  07/12/2017 What changed:  These instructions start on 07/12/2017. If you are unsure what to do until then, ask your doctor or other care provider.   carvedilol 12.5 MG tablet Commonly known as:  COREG Take 1 tablet (12.5 mg total) by mouth 2 (two) times daily. Notes to patient:  Monitor Blood Pressure Daily and keep a log for primary care physician.  You may need to make changes to your medications with rapid weight loss.     doxazosin 4 MG tablet Commonly known as:  CARDURA Take 4 mg by mouth at bedtime. Notes to patient:  Monitor Blood Pressure Daily and keep a log for primary care  physician.  You may need to make changes to your medications with rapid weight loss.     isosorbide-hydrALAZINE 20-37.5 MG tablet Commonly known as:  BIDIL Take 2 tablets by mouth 3 (three) times daily. Notes to patient:  Monitor Blood Pressure Daily and keep a log for primary care physician.  You may need to make changes to your medications with rapid weight loss.     ondansetron 4 MG disintegrating tablet Commonly known as:  ZOFRAN ODT Take 1 tablet (4 mg total) by mouth every 8 (eight) hours as needed for nausea or vomiting.   pantoprazole 40 MG tablet Commonly known as:  PROTONIX Take 1 tablet (40 mg total) by mouth daily.   traMADol 50 MG tablet Commonly known as:  ULTRAM Take 1 tablet (50 mg total) by mouth every 8 (eight) hours as needed for up to 1 dose.            Discharge Care Instructions  (From admission, onward)        Start     Ordered   07/08/17 0000  Discharge wound care:    Comments:  Remove Bandaids tomorrow, ok to shower tomorrow. Steristrips may fall off in 1-3 weeks.   07/08/17 0272     Follow-up Information    Ocie Tino, [name], MD. Go on 07/30/2017.   Specialty:  General Surgery Why:  at 2 pm Contact information: [place] [address] [phone]        Reagan Behlke, [name], MD Follow up.   Specialty:  General Surgery Contact information: [place] [address] [phone]            The results of significant diagnostics from this hospitalization (including imaging, microbiology, ancillary and laboratory) are listed below for reference.    Significant Diagnostic Studies: No results found.  Labs: Basic Metabolic Panel: Recent Labs  Lab 07/07/17 1100 07/08/17 0454  NA 138 139  K 4.6 4.7  CL 103 106  CO2 24 23  GLUCOSE 168* 161*  BUN 56* 51*  CREATININE 3.90* 3.55*  CALCIUM 7.8* 8.3*   Liver Function Tests: Recent Labs  Lab 07/07/17 1100 07/08/17 0454  AST  43* 45*  ALT 55 61  ALKPHOS 49 51  BILITOT 0.6 0.7  PROT 6.4* 7.0  ALBUMIN 3.4* 3.4*    CBC: Recent Labs  Lab 07/07/17 1100 07/08/17 0454  WBC 3.9* 7.5  NEUTROABS 2.7 6.2  HGB 10.4* 11.1*  HCT 31.9* 34.0*  MCV 86.7 86.5  PLT 196 213    CBG: Recent Labs  Lab 07/07/17 1544 07/07/17 1851 07/07/17 2015 07/08/17 0750 07/08/17 1213  GLUCAP 213* 201* 226* 144* 90    Active  Problems:   Morbid obesity ([place])   Time coordinating discharge: 55min

## 2017-07-08 NOTE — Anesthesia Postprocedure Evaluation (Signed)
Anesthesia Post Note  Patient: [name]  Procedure(s) Performed: LAPAROSCOPIC ROUX-EN-Y GASTRIC BYPASS WITH UPPER ENDOSCOPY (N/A Abdomen)     Patient location during evaluation: PACU Anesthesia Type: General Level of consciousness: awake and alert Pain management: pain level controlled Vital Signs Assessment: post-procedure vital signs reviewed and stable Respiratory status: spontaneous breathing, nonlabored ventilation and respiratory function stable Cardiovascular status: blood pressure returned to baseline and stable Postop Assessment: no apparent nausea or vomiting Anesthetic complications: no                   Kailin Leu A.

## 2017-07-08 NOTE — Progress Notes (Signed)
Pt alert, oriented, ambulating, and tolerating liquids.  D/C instructions were given and all questions were answered. Pt was d/cd home with spouse.

## 2017-07-08 NOTE — Progress Notes (Signed)
Patient alert and oriented, pain is controlled. Patient is tolerating fluids, advanced to protein shake today, patient is tolerating well. Reviewed Gastric Bypass discharge instructions with patient and patient is able to articulate understanding. Provided information on BELT program, Support Group and [facility] outpatient pharmacy. All questions answered, will continue to monitor.    

## 2017-07-08 NOTE — Progress Notes (Signed)
Patient alert and oriented, Post op day 1.  Provided support and encouragement.  Encouraged pulmonary toilet, ambulation and small sips of liquids.  Patient completed 12 ounces of bari clear fluids and one premiere protein shake.  All questions answered.  Will continue to monitor.

## 2017-07-09 ENCOUNTER — Encounter (HOSPITAL_COMMUNITY): Payer: Self-pay

## 2017-07-09 ENCOUNTER — Emergency Department (HOSPITAL_COMMUNITY): Payer: 59 | Admitting: Anesthesiology

## 2017-07-09 ENCOUNTER — Other Ambulatory Visit: Payer: Self-pay

## 2017-07-09 ENCOUNTER — Emergency Department (HOSPITAL_COMMUNITY): Payer: 59

## 2017-07-09 ENCOUNTER — Encounter (HOSPITAL_COMMUNITY)
Admission: EM | Disposition: A | Discharge status: Home / Self Care | Payer: Self-pay | Source: Home / Self Care | Attending: General Surgery

## 2017-07-09 ENCOUNTER — Ambulatory Visit: Admit: 2017-07-09 | Payer: 59 | Admitting: General Surgery

## 2017-07-09 ENCOUNTER — Inpatient Hospital Stay (HOSPITAL_COMMUNITY)
Admission: EM | Admit: 2017-07-09 | Discharge: 2017-07-11 | DRG: 354 | Disposition: A | Discharge status: Home / Self Care | Payer: 59 | Attending: General Surgery | Admitting: General Surgery

## 2017-07-09 DIAGNOSIS — I129 Hypertensive chronic kidney disease with stage 1 through stage 4 chronic kidney disease, or unspecified chronic kidney disease: Secondary | ICD-10-CM | POA: Diagnosis present

## 2017-07-09 DIAGNOSIS — Z8249 Family history of ischemic heart disease and other diseases of the circulatory system: Secondary | ICD-10-CM

## 2017-07-09 DIAGNOSIS — K460 Unspecified abdominal hernia with obstruction, without gangrene: Secondary | ICD-10-CM

## 2017-07-09 DIAGNOSIS — E1122 Type 2 diabetes mellitus with diabetic chronic kidney disease: Secondary | ICD-10-CM | POA: Diagnosis present

## 2017-07-09 DIAGNOSIS — Z9884 Bariatric surgery status: Secondary | ICD-10-CM

## 2017-07-09 DIAGNOSIS — K420 Umbilical hernia with obstruction, without gangrene: Principal | ICD-10-CM | POA: Diagnosis present

## 2017-07-09 DIAGNOSIS — N184 Chronic kidney disease, stage 4 (severe): Secondary | ICD-10-CM | POA: Diagnosis present

## 2017-07-09 DIAGNOSIS — Z6841 Body Mass Index (BMI) 40.0 and over, adult: Secondary | ICD-10-CM | POA: Diagnosis not present

## 2017-07-09 DIAGNOSIS — D649 Anemia, unspecified: Secondary | ICD-10-CM | POA: Diagnosis present

## 2017-07-09 DIAGNOSIS — G4730 Sleep apnea, unspecified: Secondary | ICD-10-CM | POA: Diagnosis present

## 2017-07-09 HISTORY — PX: UMBILICAL HERNIA REPAIR: SHX196

## 2017-07-09 LAB — COMPREHENSIVE METABOLIC PANEL
ALBUMIN: 3.6 g/dL (ref 3.5–5.0)
ALK PHOS: 51 U/L (ref 38–126)
ALT: 64 U/L — AB (ref 17–63)
ANION GAP: 15 (ref 5–15)
AST: 37 U/L (ref 15–41)
BUN: 54 mg/dL — ABNORMAL HIGH (ref 6–20)
CALCIUM: 8.9 mg/dL (ref 8.9–10.3)
CO2: 20 mmol/L — AB (ref 22–32)
Chloride: 106 mmol/L (ref 101–111)
Creatinine, Ser: 3.63 mg/dL — ABNORMAL HIGH (ref 0.61–1.24)
GFR calc Af Amer: 21 mL/min — ABNORMAL LOW (ref >60)
GFR calc non Af Amer: 18 mL/min — ABNORMAL LOW (ref >60)
GLUCOSE: 177 mg/dL — AB (ref 65–99)
Potassium: 4.2 mmol/L (ref 3.5–5.1)
SODIUM: 141 mmol/L (ref 135–145)
Total Bilirubin: 0.9 mg/dL (ref 0.3–1.2)
Total Protein: 7.2 g/dL (ref 6.5–8.1)

## 2017-07-09 LAB — HEMOGLOBIN AND HEMATOCRIT, BLOOD
HCT: 33.8 % — ABNORMAL LOW (ref 39.0–52.0)
Hemoglobin: 10.9 g/dL — ABNORMAL LOW (ref 13.0–17.0)

## 2017-07-09 LAB — LIPASE, BLOOD: Lipase: 25 U/L (ref 11–51)

## 2017-07-09 LAB — CBC
HCT: 38.1 % — ABNORMAL LOW (ref 39.0–52.0)
HEMOGLOBIN: 12.3 g/dL — AB (ref 13.0–17.0)
MCH: 28.4 pg (ref 26.0–34.0)
MCHC: 32.3 g/dL (ref 30.0–36.0)
MCV: 88 fL (ref 78.0–100.0)
Platelets: 226 10*3/uL (ref 150–400)
RBC: 4.33 MIL/uL (ref 4.22–5.81)
RDW: 15.3 % (ref 11.5–15.5)
WBC: 10.2 10*3/uL (ref 4.0–10.5)

## 2017-07-09 LAB — GLUCOSE, CAPILLARY
GLUCOSE-CAPILLARY: 138 mg/dL — AB (ref 65–99)
Glucose-Capillary: 147 mg/dL — ABNORMAL HIGH (ref 65–99)

## 2017-07-09 SURGERY — REPAIR, HERNIA, UMBILICAL, LAPAROSCOPIC
Anesthesia: General

## 2017-07-09 MED ORDER — DEXAMETHASONE SODIUM PHOSPHATE 10 MG/ML IJ SOLN
INTRAMUSCULAR | Status: AC
  Filled 2017-07-09: qty 1

## 2017-07-09 MED ORDER — CARVEDILOL 12.5 MG PO TABS
12.5000 mg | ORAL_TABLET | Freq: Two times a day (BID) | ORAL | Status: DC
  Administered 2017-07-09 – 2017-07-11 (×4): 12.5 mg via ORAL
  Filled 2017-07-09 (×4): qty 1

## 2017-07-09 MED ORDER — PANTOPRAZOLE SODIUM 40 MG PO TBEC
40.0000 mg | DELAYED_RELEASE_TABLET | Freq: Every day | ORAL | Status: DC
  Administered 2017-07-10 – 2017-07-11 (×2): 40 mg via ORAL
  Filled 2017-07-09 (×2): qty 1

## 2017-07-09 MED ORDER — OXYCODONE HCL 5 MG/5ML PO SOLN
5.0000 mg | Freq: Once | ORAL | Status: DC | PRN
  Filled 2017-07-09: qty 5

## 2017-07-09 MED ORDER — HEPARIN SODIUM (PORCINE) 5000 UNIT/ML IJ SOLN
5000.0000 [IU] | Freq: Three times a day (TID) | INTRAMUSCULAR | Status: DC
  Administered 2017-07-09 – 2017-07-10 (×2): 5000 [IU] via SUBCUTANEOUS
  Filled 2017-07-09 (×2): qty 1

## 2017-07-09 MED ORDER — SUCCINYLCHOLINE CHLORIDE 200 MG/10ML IV SOSY
PREFILLED_SYRINGE | INTRAVENOUS | Status: AC
  Filled 2017-07-09: qty 10

## 2017-07-09 MED ORDER — PANTOPRAZOLE SODIUM 40 MG IV SOLR: 40.0000 mg | Freq: Every day | INTRAVENOUS | Status: DC

## 2017-07-09 MED ORDER — MIDAZOLAM HCL 2 MG/2ML IJ SOLN
INTRAMUSCULAR | Status: AC
  Filled 2017-07-09: qty 2

## 2017-07-09 MED ORDER — EPHEDRINE SULFATE 50 MG/ML IJ SOLN
INTRAMUSCULAR | Status: DC | PRN
  Administered 2017-07-09 (×2): 10 mg via INTRAVENOUS

## 2017-07-09 MED ORDER — ROCURONIUM BROMIDE 100 MG/10ML IV SOLN
INTRAVENOUS | Status: DC | PRN
  Administered 2017-07-09: 10 mg via INTRAVENOUS
  Administered 2017-07-09: 40 mg via INTRAVENOUS

## 2017-07-09 MED ORDER — ONDANSETRON HCL 4 MG/2ML IJ SOLN: 4.0000 mg | INTRAMUSCULAR | Status: DC | PRN

## 2017-07-09 MED ORDER — PREMIER PROTEIN SHAKE
2.0000 [oz_av] | ORAL | Status: DC
  Administered 2017-07-09 – 2017-07-11 (×9): 2 [oz_av] via ORAL

## 2017-07-09 MED ORDER — SIMETHICONE 80 MG PO CHEW: 80.0000 mg | CHEWABLE_TABLET | Freq: Four times a day (QID) | ORAL | Status: DC | PRN

## 2017-07-09 MED ORDER — FENTANYL CITRATE (PF) 100 MCG/2ML IJ SOLN: 25.0000 ug | INTRAMUSCULAR | Status: DC | PRN

## 2017-07-09 MED ORDER — IOPAMIDOL (ISOVUE-300) INJECTION 61%
30.0000 mL | Freq: Once | INTRAVENOUS | Status: AC
  Administered 2017-07-09: 15 mL via ORAL

## 2017-07-09 MED ORDER — LACTATED RINGERS IR SOLN
Status: DC | PRN
  Administered 2017-07-09: 1000 mL

## 2017-07-09 MED ORDER — SODIUM CHLORIDE 0.9 % IV BOLUS (SEPSIS)
1000.0000 mL | Freq: Once | INTRAVENOUS | Status: AC
Start: 2017-07-09 — End: 2017-07-09
  Administered 2017-07-09: 1000 mL via INTRAVENOUS

## 2017-07-09 MED ORDER — IOPAMIDOL (ISOVUE-300) INJECTION 61%
INTRAVENOUS | Status: AC
  Filled 2017-07-09: qty 30

## 2017-07-09 MED ORDER — LABETALOL HCL 5 MG/ML IV SOLN
10.0000 mg | Freq: Once | INTRAVENOUS | Status: AC
  Administered 2017-07-09: 10 mg via INTRAVENOUS

## 2017-07-09 MED ORDER — SODIUM CHLORIDE 0.9 % IV SOLN
INTRAVENOUS | Status: DC
  Administered 2017-07-09 – 2017-07-10 (×2): via INTRAVENOUS
  Administered 2017-07-10: 125 mL/h via INTRAVENOUS
  Administered 2017-07-11: 06:00:00 via INTRAVENOUS

## 2017-07-09 MED ORDER — MORPHINE SULFATE (PF) 4 MG/ML IV SOLN
4.0000 mg | Freq: Once | INTRAVENOUS | Status: AC
  Administered 2017-07-09: 4 mg via INTRAVENOUS
  Filled 2017-07-09: qty 1

## 2017-07-09 MED ORDER — PROPOFOL 10 MG/ML IV BOLUS
INTRAVENOUS | Status: AC
  Filled 2017-07-09: qty 40

## 2017-07-09 MED ORDER — PROMETHAZINE HCL 25 MG/ML IJ SOLN
6.2500 mg | INTRAMUSCULAR | Status: DC | PRN
Start: 2017-07-09 — End: 2017-07-09

## 2017-07-09 MED ORDER — CHLORHEXIDINE GLUCONATE CLOTH 2 % EX PADS: 6.0000 | MEDICATED_PAD | Freq: Once | CUTANEOUS | Status: DC

## 2017-07-09 MED ORDER — LIDOCAINE 2% (20 MG/ML) 5 ML SYRINGE
INTRAMUSCULAR | Status: AC
Start: 2017-07-09 — End: 2017-07-09
  Filled 2017-07-09: qty 5

## 2017-07-09 MED ORDER — HEPARIN SODIUM (PORCINE) 5000 UNIT/ML IJ SOLN
5000.0000 [IU] | Freq: Once | INTRAMUSCULAR | Status: AC
  Administered 2017-07-09: 5000 [IU] via SUBCUTANEOUS
  Filled 2017-07-09 (×2): qty 1

## 2017-07-09 MED ORDER — PROPOFOL 10 MG/ML IV BOLUS
INTRAVENOUS | Status: DC | PRN
  Administered 2017-07-09: 260 mg via INTRAVENOUS

## 2017-07-09 MED ORDER — ONDANSETRON HCL 4 MG/2ML IJ SOLN
INTRAMUSCULAR | Status: AC
  Filled 2017-07-09: qty 2

## 2017-07-09 MED ORDER — HYDROMORPHONE HCL 1 MG/ML IJ SOLN
1.0000 mg | Freq: Once | INTRAMUSCULAR | Status: AC
  Administered 2017-07-09: 1 mg via INTRAVENOUS
  Filled 2017-07-09: qty 1

## 2017-07-09 MED ORDER — LACTATED RINGERS IV SOLN
INTRAVENOUS | Status: DC
  Administered 2017-07-09 (×2): via INTRAVENOUS

## 2017-07-09 MED ORDER — ACETAMINOPHEN 325 MG PO TABS
650.0000 mg | ORAL_TABLET | Freq: Four times a day (QID) | ORAL | Status: DC
  Administered 2017-07-09 – 2017-07-10 (×2): 650 mg via ORAL
  Filled 2017-07-09 (×2): qty 2

## 2017-07-09 MED ORDER — SUGAMMADEX SODIUM 500 MG/5ML IV SOLN
INTRAVENOUS | Status: DC | PRN
  Administered 2017-07-09: 300 mg via INTRAVENOUS

## 2017-07-09 MED ORDER — 0.9 % SODIUM CHLORIDE (POUR BTL) OPTIME
TOPICAL | Status: DC | PRN
  Administered 2017-07-09: 1000 mL

## 2017-07-09 MED ORDER — BUPIVACAINE LIPOSOME 1.3 % IJ SUSP
20.0000 mL | Freq: Once | INTRAMUSCULAR | Status: AC
  Administered 2017-07-09: 20 mL
  Filled 2017-07-09: qty 20

## 2017-07-09 MED ORDER — AMLODIPINE BESYLATE 10 MG PO TABS
10.0000 mg | ORAL_TABLET | Freq: Every day | ORAL | Status: DC
  Administered 2017-07-10 – 2017-07-11 (×2): 10 mg via ORAL
  Filled 2017-07-09 (×2): qty 1

## 2017-07-09 MED ORDER — SUCCINYLCHOLINE CHLORIDE 20 MG/ML IJ SOLN
INTRAMUSCULAR | Status: DC | PRN
  Administered 2017-07-09: 140 mg via INTRAVENOUS

## 2017-07-09 MED ORDER — DOXAZOSIN MESYLATE 2 MG PO TABS
4.0000 mg | ORAL_TABLET | Freq: Every day | ORAL | Status: DC
  Administered 2017-07-09 – 2017-07-10 (×2): 4 mg via ORAL
  Filled 2017-07-09 (×2): qty 2

## 2017-07-09 MED ORDER — BUPIVACAINE-EPINEPHRINE (PF) 0.5% -1:200000 IJ SOLN
INTRAMUSCULAR | Status: AC
  Filled 2017-07-09: qty 30

## 2017-07-09 MED ORDER — ROCURONIUM BROMIDE 10 MG/ML (PF) SYRINGE
PREFILLED_SYRINGE | INTRAVENOUS | Status: AC
  Filled 2017-07-09: qty 5

## 2017-07-09 MED ORDER — HYDRALAZINE HCL 20 MG/ML IJ SOLN
10.0000 mg | INTRAMUSCULAR | Status: DC | PRN
  Administered 2017-07-10: 10 mg via INTRAVENOUS
  Filled 2017-07-09: qty 1

## 2017-07-09 MED ORDER — MORPHINE SULFATE (PF) 2 MG/ML IV SOLN: 1.0000 mg | INTRAVENOUS | Status: DC | PRN

## 2017-07-09 MED ORDER — OXYCODONE HCL 5 MG/5ML PO SOLN
5.0000 mg | ORAL | Status: DC | PRN
  Administered 2017-07-10: 5 mg via ORAL
  Administered 2017-07-11: 10 mg via ORAL
  Filled 2017-07-09: qty 5
  Filled 2017-07-09: qty 10

## 2017-07-09 MED ORDER — OXYCODONE HCL 5 MG PO TABS: 5.0000 mg | ORAL_TABLET | Freq: Once | ORAL | Status: DC | PRN

## 2017-07-09 MED ORDER — ONDANSETRON HCL 4 MG/2ML IJ SOLN
INTRAMUSCULAR | Status: DC | PRN
  Administered 2017-07-09: 4 mg via INTRAVENOUS

## 2017-07-09 MED ORDER — FENTANYL CITRATE (PF) 100 MCG/2ML IJ SOLN
INTRAMUSCULAR | Status: DC | PRN
  Administered 2017-07-09 (×2): 100 ug via INTRAVENOUS

## 2017-07-09 MED ORDER — GABAPENTIN 100 MG PO CAPS
200.0000 mg | ORAL_CAPSULE | Freq: Two times a day (BID) | ORAL | Status: DC
  Administered 2017-07-09 – 2017-07-11 (×4): 200 mg via ORAL
  Filled 2017-07-09 (×4): qty 2

## 2017-07-09 MED ORDER — DEXTROSE 5 % IV SOLN
3.0000 g | INTRAVENOUS | Status: AC
  Administered 2017-07-09: 3 g via INTRAVENOUS
  Filled 2017-07-09: qty 3

## 2017-07-09 MED ORDER — BUPIVACAINE-EPINEPHRINE 0.5% -1:200000 IJ SOLN
INTRAMUSCULAR | Status: DC | PRN
  Administered 2017-07-09: 30 mL

## 2017-07-09 MED ORDER — LABETALOL HCL 5 MG/ML IV SOLN
INTRAVENOUS | Status: AC
  Administered 2017-07-09: 10 mg via INTRAVENOUS
  Filled 2017-07-09: qty 4

## 2017-07-09 MED ORDER — FENTANYL CITRATE (PF) 250 MCG/5ML IJ SOLN
INTRAMUSCULAR | Status: AC
  Filled 2017-07-09: qty 5

## 2017-07-09 SURGICAL SUPPLY — 43 items
APL SKNCLS STERI-STRIP NONHPOA (GAUZE/BANDAGES/DRESSINGS) ×1
APPLIER CLIP 5 13 M/L LIGAMAX5 (MISCELLANEOUS)
APR CLP MED LRG 5 ANG JAW (MISCELLANEOUS)
BANDAGE ADH SHEER 1  50/CT (GAUZE/BANDAGES/DRESSINGS) ×10 IMPLANT
BENZOIN TINCTURE PRP APPL 2/3 (GAUZE/BANDAGES/DRESSINGS) ×2 IMPLANT
BINDER ABDOMINAL 12 ML 46-62 (SOFTGOODS) IMPLANT
CHLORAPREP W/TINT 26ML (MISCELLANEOUS) ×2 IMPLANT
CLIP APPLIE 5 13 M/L LIGAMAX5 (MISCELLANEOUS) IMPLANT
COVER SURGICAL LIGHT HANDLE (MISCELLANEOUS) ×2 IMPLANT
DEVICE SECURE STRAP 25 ABSORB (INSTRUMENTS) ×2 IMPLANT
DRAIN CHANNEL 19F RND (DRAIN) IMPLANT
ELECT REM PT RETURN 15FT ADLT (MISCELLANEOUS) ×2 IMPLANT
EVACUATOR SILICONE 100CC (DRAIN) IMPLANT
GLOVE BIOGEL PI IND STRL 7.0 (GLOVE) ×1 IMPLANT
GLOVE BIOGEL PI INDICATOR 7.0 (GLOVE) ×1
GLOVE SURG SS PI 7.0 STRL IVOR (GLOVE) ×2 IMPLANT
GOWN STRL REUS W/TWL LRG LVL3 (GOWN DISPOSABLE) ×2 IMPLANT
GOWN STRL REUS W/TWL XL LVL3 (GOWN DISPOSABLE) ×4 IMPLANT
GRASPER SUT TROCAR 14GX15 (MISCELLANEOUS) ×2 IMPLANT
IRRIG SUCT STRYKERFLOW 2 WTIP (MISCELLANEOUS)
IRRIGATION SUCT STRKRFLW 2 WTP (MISCELLANEOUS) IMPLANT
KIT BASIN OR (CUSTOM PROCEDURE TRAY) ×2 IMPLANT
MARKER SKIN DUAL TIP RULER LAB (MISCELLANEOUS) IMPLANT
MESH VENTRALIGHT ST 6X8 (Mesh Specialty) ×2 IMPLANT
MESH VENTRLGHT ELLIPSE 8X6XMFL (Mesh Specialty) IMPLANT
POSITIONER SURGICAL ARM (MISCELLANEOUS) IMPLANT
SCISSORS LAP 5X35 DISP (ENDOMECHANICALS) ×2 IMPLANT
SHEARS HARMONIC ACE PLUS 36CM (ENDOMECHANICALS) ×1 IMPLANT
SLEEVE SURGEON STRL (DRAPES) ×1 IMPLANT
SLEEVE XCEL OPT CAN 5 100 (ENDOMECHANICALS) ×3 IMPLANT
STRIP CLOSURE SKIN 1/2X4 (GAUZE/BANDAGES/DRESSINGS) ×2 IMPLANT
SUT ETHILON 2 0 PS N (SUTURE) IMPLANT
SUT MNCRL AB 4-0 PS2 18 (SUTURE) ×2 IMPLANT
SUT NOVA 0 T19/GS 22DT (SUTURE) ×2 IMPLANT
SUT PDS AB 0 CTX 60 (SUTURE) ×1 IMPLANT
SUT VICRYL 0 UR6 27IN ABS (SUTURE) ×1 IMPLANT
TAPE CLOTH 4X10 WHT NS (GAUZE/BANDAGES/DRESSINGS) IMPLANT
TOWEL OR 17X26 10 PK STRL BLUE (TOWEL DISPOSABLE) ×2 IMPLANT
TRAY LAPAROSCOPIC (CUSTOM PROCEDURE TRAY) ×2 IMPLANT
TROCAR BLADELESS OPT 5 100 (ENDOMECHANICALS) ×2 IMPLANT
TROCAR XCEL 12X100 BLDLESS (ENDOMECHANICALS) IMPLANT
TROCAR XCEL NON-BLD 11X100MML (ENDOMECHANICALS) ×2 IMPLANT
TUBING INSUF HEATED (TUBING) ×2 IMPLANT

## 2017-07-09 NOTE — Anesthesia Preprocedure Evaluation (Addendum)
Anesthesia Evaluation  Patient identified by MRN, date of birth, ID band Patient awake    Reviewed: Allergy & Precautions, NPO status , Patient's Chart, lab work & pertinent test results, reviewed documented beta blocker date and time   Airway Mallampati: III  TM Distance: >3 FB Neck ROM: Full    Dental no notable dental hx. (+) Teeth Intact   Pulmonary sleep apnea and Continuous Positive Airway Pressure Ventilation ,    Pulmonary exam normal breath sounds clear to auscultation       Cardiovascular hypertension, Pt. on medications and Pt. on home beta blockers Normal cardiovascular exam Rhythm:Regular Rate:Normal  '18 TTE - Mild concentric LVH. EF 55% to 60%. Grade 2 diastolic dysfunctio). Mild MR. Mildly dilated LA.   EKG - SB   Neuro/Psych negative neurological ROS  negative psych ROS   GI/Hepatic Neg liver ROS, Incarcerated umbilical hernia   Endo/Other  diabetes, Poorly Controlled, Type 2, Oral Hypoglycemic AgentsMorbid obesity  Renal/GU CRFRenal disease     Musculoskeletal  (+) Arthritis , Osteoarthritis,    Abdominal (+) + obese,   Peds  Hematology  (+) anemia ,   Anesthesia Other Findings   Reproductive/Obstetrics                            Anesthesia Physical  Anesthesia Plan  ASA: III and emergent  Anesthesia Plan: General   Post-op Pain Management:    Induction: Intravenous, Cricoid pressure planned and Rapid sequence  PONV Risk Score and Plan: 4 or greater and Midazolam, Dexamethasone, Ondansetron and Treatment may vary due to age or medical condition  Airway Management Planned: Oral ETT and Video Laryngoscope Planned  Additional Equipment:   Intra-op Plan:   Post-operative Plan: Extubation in OR  Informed Consent: I have reviewed the patients History and Physical, chart, labs and discussed the procedure including the risks, benefits and alternatives for the  proposed anesthesia with the patient or authorized representative who has indicated his/her understanding and acceptance.   Dental advisory given  Plan Discussed with: CRNA and Anesthesiologist  Anesthesia Plan Comments: (Intubated with Glidescope 2 days ago, grade 3 view requiring cricoid pressure for intubation)       Anesthesia Quick Evaluation

## 2017-07-09 NOTE — ED Provider Notes (Signed)
[place] DEPT Provider Note   CSN: [id number] Arrival date & time: 07/09/17  3664     History   Chief Complaint Chief Complaint  Patient presents with  . Abdominal Pain    HPI [name] is a 52 y.o. male.  He has a history of diabetes hypertension CKD and obesity who on Tuesday underwent a Roux-en-Y surgery for bariatric by Dr. Cherlyn Roberts.  He states yesterday he was able to tolerate some liquid but had not passed any gas and had to move his bowels.  Throughout the night yesterday at home he had increased abdominal pain that was not controlled by the tramadol.  Today he is complaining of severe pain and still not passing any gas from below.  States his been able to tolerate liquid because his abdomen is so distended.  He has had a cough from before the surgery and that continues.  He denies any fever.  He had some reflux last night while he was lying down flat.  The history is provided by the patient.  Abdominal Pain   This is a new problem. The current episode started yesterday. The problem occurs constantly. The problem has been gradually worsening. Associated with: surgery. The pain is located in the generalized abdominal region and epigastric region. The quality of the pain is pressure-like. The pain is severe. Associated symptoms include constipation. Pertinent negatives include fever, belching, diarrhea, flatus, hematochezia, melena, nausea, vomiting, dysuria, frequency, hematuria, headaches and arthralgias. The symptoms are aggravated by coughing and eating. Nothing relieves the symptoms. Past workup includes surgery.    Past Medical History:  Diagnosis Date  . Anemia    iron infusions in last few months for anemia   . Arthritis    ankles   . Chronic kidney disease    followed by Dr Arty Baumgartner   . Diabetes mellitus without complication ([place])    type II   . Hypertension   . Obesity   . Sinus congestion    all the time  . Sleep apnea    cpap off and on     Patient Active Problem List   Diagnosis Date Noted  . Morbid obesity ([place]) 07/07/2017  . Back pain 03/27/2017  . Hypertension   . Diabetes mellitus without complication ([place])   . Obesity   . Obstructive sleep apnea 11/06/2015  . Nasal turbinate hypertrophy 11/06/2015  . Rhinitis, chronic 11/06/2015    Past Surgical History:  Procedure Laterality Date  . 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 NASAL SEPTUM SURGERY    . QUADRICEPS TENDON REPAIR  05/13/2012   Procedure: REPAIR QUADRICEP TENDON;  Surgeon: Augustin Schooling, MD;  Location: [place];  Service: Orthopedics;  Laterality: Right;       Home Medications    Prior to Admission medications   Medication Sig Start Date End Date Taking? Authorizing Provider  acetaminophen (TYLENOL) 500 MG tablet Take 1,000 mg by mouth every 6 (six) hours as needed (for pain.).   Yes [provider]  amLODipine (NORVASC) 10 MG tablet Take 10 mg by mouth daily.   Yes [provider]  carvedilol (COREG) 12.5 MG tablet Take 1 tablet (12.5 mg total) by mouth 2 (two) times daily. 06/19/17 09/17/17 Yes Skeet Latch, MD  doxazosin (CARDURA) 4 MG tablet Take 4 mg by mouth at bedtime.  Yes [provider]  isosorbide-hydrALAZINE (BIDIL) 20-37.5 MG tablet Take 2 tablets by mouth 3 (three) times daily.    Yes [provider]  metoprolol succinate (TOPROL-XL) 100 MG 24 hr tablet Take 100 mg by mouth daily. 05/14/17  Yes [provider]  NAFTIN 2 % GEL Apply 1 application topically daily as needed. 06/19/17  Yes [provider]  ondansetron (ZOFRAN ODT) 4 MG disintegrating tablet Take 1 tablet (4 mg total) by mouth every 8 (eight) hours as needed for nausea or vomiting. 07/08/17  Yes [name], [name], MD  pantoprazole  (PROTONIX) 40 MG tablet Take 1 tablet (40 mg total) by mouth daily. 07/08/17  Yes [name], [name], MD  traMADol (ULTRAM) 50 MG tablet Take 1 tablet (50 mg total) by mouth every 8 (eight) hours as needed for up to 1 dose. 07/08/17  Yes [name], [name], MD  aspirin 81 MG chewable tablet Chew 1 tablet (81 mg total) by mouth daily. Patient not taking: Reported on 07/09/2017 07/12/17   [name], [name], MD    Family History Family History  Problem Relation Age of Onset  . Hypertension Other   . Hypertension Mother   . Cataracts Father     Social History Social History   Tobacco Use  . Smoking status: Never Smoker  . Smokeless tobacco: Never Used  Substance Use Topics  . Alcohol use: Yes    Alcohol/week: 3.0 oz    Types: 5 Cans of beer per week    Comment: Social  . Drug use: No     Allergies   Patient has no known allergies.   Review of Systems Review of Systems  Constitutional: Negative for chills and fever.  HENT: Negative for ear pain and sore throat.   Eyes: Negative for pain and visual disturbance.  Respiratory: Positive for cough. Negative for shortness of breath.   Cardiovascular: Negative for chest pain and palpitations.  Gastrointestinal: Positive for abdominal pain and constipation. Negative for diarrhea, flatus, hematochezia, melena, nausea and vomiting.  Genitourinary: Negative for dysuria, frequency and hematuria.  Musculoskeletal: Negative for arthralgias and back pain.  Skin: Negative for color change and rash.  Neurological: Negative for seizures, syncope and headaches.  All other systems reviewed and are negative.    Physical Exam Updated Vital Signs Ht 5\' 8"  (1.727 m)   Wt (!) 149.7 kg (330 lb)   BMI 50.18 kg/m   Physical Exam  Constitutional: He appears well-developed and well-nourished.  Non-toxic appearance. He does not appear ill.  HENT:  Head: Normocephalic and atraumatic.  Mouth/Throat: Oropharynx is clear and moist.  Eyes:  Conjunctivae are normal.  Neck: Neck supple.  Cardiovascular: Normal rate and regular rhythm.  No murmur heard. Pulmonary/Chest: Effort normal and breath sounds normal. No respiratory distress.  Abdominal: He exhibits distension. He exhibits no mass. There is generalized tenderness. There is no rigidity and no guarding.  Port sites without surrounding erythema or ecchymosis.  Musculoskeletal: He exhibits no edema.  Neurological: He is alert.  Skin: Skin is warm and dry. Capillary refill takes less than 2 seconds.  Psychiatric: He has a normal mood and affect.  Nursing note and vitals reviewed.    ED Treatments / Results  Labs (all labs ordered are listed, but only abnormal results are displayed) Labs Reviewed  LIPASE, BLOOD  COMPREHENSIVE METABOLIC PANEL  CBC  URINALYSIS, ROUTINE W REFLEX MICROSCOPIC    EKG  EKG Interpretation None       Radiology No results  found.  CT with incarcerated umbilical hernia  Procedures Procedures (including critical care time)  Medications Ordered in ED Medications  sodium chloride 0.9 % bolus 1,000 mL (not administered)  morphine 4 MG/ML injection 4 mg (not administered)     Initial Impression / Assessment and Plan / ED Course  I have reviewed the triage vital signs and the nursing notes.  Pertinent labs & imaging results that were available during my care of the patient were reviewed by me and considered in my medical decision making (see chart for details).  Clinical Course as of Jul 12 1911  Thu Jul 09, 2017  1037 Discussed with surgery PA who contacted Dr. Cherlyn Roberts.  They would like a CT abdomen and he will stop by and see the patient around noon.  Patient updated on plan.  [MB]  5051 Reevaluated-pain improved to 9 although looks more comfortable.  Awaiting CT and surgery attending.  [MB]  1328 CT concerning for umbilical hernia with signs of acute incarceration.  Sounds like his surgeon has been by and already talked to him  about it going to the operating room.  I will attempt to connect with surgeon.  [MB]    Clinical Course User Index [MB] Hayden Rasmussen, MD    Final Clinical Impressions(s) / ED Diagnoses   Final diagnoses:  Incarcerated hernia    ED Discharge Orders    None       Hayden Rasmussen, MD 07/11/17 [phone]

## 2017-07-09 NOTE — ED Triage Notes (Signed)
Patient had a roux-en-y surgery 2 days ago and was discharged from the hospital yesterday. Patient states he is bloated and has not had a BM or passing gas since surgery. Patient denies any N/V.

## 2017-07-09 NOTE — Transfer of Care (Signed)
Immediate Anesthesia Transfer of Care Note  Patient: [name]  Procedure(s) Performed: LAPAROSCOPIC UMBILICAL HERNIA WITH MESH (N/A )  Patient Location: PACU  Anesthesia Type:General  Level of Consciousness: awake, alert  and oriented  Airway & Oxygen Therapy: Patient Spontanous Breathing and Patient connected to face mask oxygen  Post-op Assessment: Report given to RN and Post -op Vital signs reviewed and stable  Post vital signs: Reviewed and stable  Last Vitals:  Vitals:   07/09/17 1115 07/09/17 1130  BP:  (!) 162/90  Pulse: 86   Resp:    Temp:    SpO2: 96%     Last Pain:  Vitals:   07/09/17 1141  TempSrc:   PainSc: 9       Patients Stated Pain Goal: 2 (70/96/43 8381)  Complications: No apparent anesthesia complications

## 2017-07-09 NOTE — Anesthesia Procedure Notes (Signed)
Procedure Name: Intubation Date/Time: 07/09/2017 3:16 PM Performed by: Glory Buff, CRNA Pre-anesthesia Checklist: Patient identified, Emergency Drugs available, Suction available and Patient being monitored Patient Re-evaluated:Patient Re-evaluated prior to induction Oxygen Delivery Method: Circle system utilized Preoxygenation: Pre-oxygenation with 100% oxygen Induction Type: IV induction Ventilation: Mask ventilation without difficulty Laryngoscope Size: Glidescope and 4 Grade View: Grade I Tube type: Oral Tube size: 7.5 mm Number of attempts: 1 Airway Equipment and Method: Stylet and Oral airway Placement Confirmation: ETT inserted through vocal cords under direct vision,  positive ETCO2 and breath sounds checked- equal and bilateral Secured at: 22 cm Tube secured with: Tape Dental Injury: Teeth and Oropharynx as per pre-operative assessment  Difficulty Due To: Difficulty was anticipated, Difficult Airway- due to large tongue and Difficult Airway- due to anterior larynx

## 2017-07-09 NOTE — Op Note (Signed)
Preoperative diagnosis: umbilical hernia with obstruction  Postoperative diagnosis: Same   Procedure: laparoscopic umbilical hernia repair with mesh  Surgeon: Gurney Maxin, M.D.  Asst: none  Anesthesia: Gen.   Indications for procedure: [name] is a 52 y.o. male with symptoms of abdominal pain and hernia incarcerated on exam. He is 2 days from gastric bypass and represented to the ER with new abdominal pain and was diagnosed with a bowel obstruction and small intestine (common channel) looped into the umbilical hernia.  Description of procedure: The patient was brought into the operative suite, placed supine. Anesthesia was administered with endotracheal tube. Patient was strapped in place and foot board was secured. Both arms were tucked. All pressure points were offloaded by foam padding. The patient was prepped and draped in the usual sterile fashion.  Previous LUQ incision was reused to gain access to the peritoneum. Pneumoperitoneum was applied with high flow low pressure and laparoscope was reinserted. The bowel appeared distended and the intestine was seen going into the umbilical hernia with some erythema in the mesentery. 3 additional previous trocars were reused the right upper quadrant 78mm port site was used, the left lateral and left midabdominal sites were reused with 96mm trocars. The intestine was gently manipulated and reduced from the hernia. The intestine was inspected and was not perforated or gangrenous.  The preperitoneal fat was removed from the fascia to allow good mesh to fascia apposition. The defect was about 4 cm in diameter. A 15x20cm ventralight mesh was inserted and used to the repair the mesh. 8 transfascial 0 prolene sutures were used to secure the mesh in place and absorbable tackers were used to appose the mesh against the abdominal wall in all areas.  The abdominal contents were again inspected and hemostasis was intact.  0 vicryl was used to close the  fascial defect of the 49mm trocar site using suture passer. Pneumoperitoneum was removed, all trocar were removed. All incisions were closed with 4-0 monocryl subcuticular stitch. The patient woke from anesthesia and was brought to PACU in stable condition.  Findings: common channel intestine incarcerated into the umbilical hernia, no gangrene or perforation. Good mesh repair  Specimen: none  Blood loss: 50 ml  Local anesthesia: 50 ml QTMAUQJ:3.3% [place]  Complications: none  Implant: 15x20cm ventralight ST mesh  Gurney Maxin, M.D. General, Bariatric, & Minimally Invasive Surgery [facility]

## 2017-07-09 NOTE — H&P (Signed)
[name] is an 52 y.o. male.   Chief Complaint: abdominal pain HPI: 52 yo male POD 2 for RNY gastric bypass for obesity. He did well day one and was discharged home. However, he has had decreased intake and more central abdominal obesity this morning. He has no flatus or bowel movements since surgery.  Past Medical History:  Diagnosis Date  . Anemia    iron infusions in last few months for anemia   . Arthritis    ankles   . Chronic kidney disease    followed by Dr Arty Baumgartner   . Diabetes mellitus without complication ([place])    type II   . Hypertension   . Obesity   . Sinus congestion    all the time  . Sleep apnea    cpap off and on     Past Surgical History:  Procedure Laterality Date  . 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 NASAL SEPTUM SURGERY    . QUADRICEPS TENDON REPAIR  05/13/2012   Procedure: REPAIR QUADRICEP TENDON;  Surgeon: Augustin Schooling, MD;  Location: [facility];  Service: Orthopedics;  Laterality: Right;    Family History  Problem Relation Age of Onset  . Hypertension Other   . Hypertension Mother   . Cataracts Father    Social History:  reports that  has never smoked. he has never used smokeless tobacco. He reports that he drinks about 3.0 oz of alcohol per week. He reports that he does not use drugs.  Allergies: No Known Allergies   (Not in a hospital admission)  Results for orders placed or performed during the hospital encounter of 07/09/17 (from the past 48 hour(s))  Lipase, blood     Status: None   Collection Time: 07/09/17 10:32 AM  Result Value Ref Range   Lipase 25 11 - 51 U/L    Comment: Performed at [facility], [place] [address], [place], [place] [address]  Comprehensive metabolic panel     Status: Abnormal   Collection Time: 07/09/17 10:32 AM  Result Value Ref  Range   Sodium 141 135 - 145 mmol/L   Potassium 4.2 3.5 - 5.1 mmol/L   Chloride 106 101 - 111 mmol/L   CO2 20 (L) 22 - 32 mmol/L   Glucose, Bld 177 (H) 65 - 99 mg/dL   BUN 54 (H) 6 - 20 mg/dL   Creatinine, Ser 3.63 (H) 0.61 - 1.24 mg/dL   Calcium 8.9 8.9 - 10.3 mg/dL   Total Protein 7.2 6.5 - 8.1 g/dL   Albumin 3.6 3.5 - 5.0 g/dL   AST 37 15 - 41 U/L   ALT 64 (H) 17 - 63 U/L   Alkaline Phosphatase 51 38 - 126 U/L   Total Bilirubin 0.9 0.3 - 1.2 mg/dL   GFR calc non Af Amer 18 (L) >60 mL/min   GFR calc Af Amer 21 (L) >60 mL/min    Comment: (NOTE) The eGFR has been calculated using the CKD EPI equation. This calculation has not been validated in all clinical situations. eGFR's persistently <60 mL/min signify possible Chronic Kidney Disease.    Anion gap 15 5 - 15    Comment: Performed at [facility], [place] [address], [place],  [address]  CBC     Status: Abnormal   Collection Time: 07/09/17 10:32  AM  Result Value Ref Range   WBC 10.2 4.0 - 10.5 K/uL   RBC 4.33 4.22 - 5.81 MIL/uL   Hemoglobin 12.3 (L) 13.0 - 17.0 g/dL   HCT 38.1 (L) 39.0 - 52.0 %   MCV 88.0 78.0 - 100.0 fL   MCH 28.4 26.0 - 34.0 pg   MCHC 32.3 30.0 - 36.0 g/dL   RDW 15.3 11.5 - 15.5 %   Platelets 226 150 - 400 K/uL    Comment: Performed at [facility], [place] [address], [place], [place] [address]   Ct Abdomen Pelvis Wo Contrast  Result Date: 07/09/2017 CLINICAL DATA:  53 year old male postop day two status post bariatric surgery, laparoscopic Roux-en-Y bypass. Increasing abdominal pain. Not passing gas. EXAM: CT ABDOMEN AND PELVIS WITHOUT CONTRAST TECHNIQUE: Multidetector CT imaging of the abdomen and pelvis was performed following the standard protocol without IV contrast. COMPARISON:  CT Abdomen and Pelvis 11/19/2016. FINDINGS: Lower chest: Trace pneumomediastinum in the form of small scattered foci. Similar trace foci of gas subjacent to the diaphragm. There is mild  atelectasis or chronic scarring in the left lower lobe, similar to the 2018 CT. Incidental small left side diaphragmatic fat containing Bochdalek's hernia. No pleural effusion. No pericardial effusion. Hepatobiliary: Negative noncontrast liver and gallbladder. Pancreas: Negative. Spleen: Negative. Adrenals/Urinary Tract: Normal adrenal glands. Stable and negative noncontrast CT appearance of both kidneys and ureters. There is a small volume of gas within the urinary bladder which is mildly distended (estimated urinary bladder volume 253 milliliters). No perivesical stranding. Stomach/Bowel: Decompressed rectosigmoid colon. Mild sigmoid diverticulosis. Decompressed descending colon with mild diverticulosis. Largely decompressed transverse colon. Mild retained stool and gas in the right colon. There is a trace amount of free fluid in the right lower quadrant adjacent to the appendix, but the appendix 6 self appears normal. No periappendiceal fat stranding. Decompressed terminal ileum and distal small bowel throughout the right abdomen. Mildly to moderately dilated proximal small bowel from the duodenum to the level of a small umbilical hernia. Which contains a small volume of mesenteric fat and an incarcerated small bowel. See sagittal image 68 and series 2, image 69. The loop is dilated on the way in up to about 35 millimeters diameter, and fully decompressed on the way out. Superimposed small volume of small bowel mesentery free fluid. Sequelae of gastrojejunostomy and gastric bypass. Oral contrast distends the visible esophagus which is mildly dilated. The gastric pouch contains contrast and is nondilated. Contrast then is present in the gastrojejunostomy and proximal small bowel loops. There has been contrast reflux retrograde through the duodenum to the gastric antrum (series 2, image 39). The downstream small bowel anastomosis is seen on series 2, image 41 with no adverse features. Only trace abdominal free  fluid mostly along the undersurface of the diaphragm. Vascular/Lymphatic: Vascular patency is not evaluated in the absence of IV contrast. No lymphadenopathy. Reproductive: Negative. Other: No pelvic free fluid. There is a small laparoscopic port site suspected along the right abdomen on series 2, image 75 with only minimal stranding and gas. No abdominal wall fluid collection. Musculoskeletal: Oral No acute osseous abnormality identified. IMPRESSION: 1. Small but incarcerated umbilical hernia containing a small bowel loop which is the transition point for a high-grade acute small bowel obstruction. See series 2, image 69. 2. Sequelae of gastric bypass with Roux-en-Y. No adverse features at the gastrojejunostomy or small bowel anastomosis site. However, probably due to the small bowel obstruction in #1 oral contrast has transit retrograde  fashion into the gastric antrum. 3. Trace abdominal free fluid could be postoperative or related to #1. Trace postoperative appearing pneumoperitoneum and pneumomediastinum. 4. Small volume of gas within the urinary bladder. Perhaps this is explained by recent bladder catheterization, but consider also a gas-forming UTI. Urinary bladder volume is currently 253 mL. Electronically Signed   By: Genevie Ann M.D.   On: 07/09/2017 13:08    Review of Systems  Constitutional: Negative for chills and fever.  HENT: Negative for hearing loss.   Eyes: Negative for blurred vision and double vision.  Respiratory: Negative for cough and hemoptysis.   Cardiovascular: Negative for chest pain and palpitations.  Gastrointestinal: Negative for abdominal pain, nausea and vomiting.  Genitourinary: Negative for dysuria and urgency.  Musculoskeletal: Negative for myalgias and neck pain.  Skin: Negative for itching and rash.  Neurological: Negative for dizziness, tingling and headaches.  Endo/Heme/Allergies: Does not bruise/bleed easily.  Psychiatric/Behavioral: Negative for depression and  suicidal ideas.    Blood pressure (!) 162/90, pulse 86, temperature 97.7 F (36.5 C), temperature source Oral, resp. rate 20, height 5' 8"  (1.727 m), weight (!) 149.7 kg (330 lb), SpO2 96 %. Physical Exam  Vitals reviewed. Constitutional: He is oriented to person, place, and time. He appears well-developed and well-nourished.  HENT:  Head: Normocephalic and atraumatic.  Eyes: Conjunctivae and EOM are normal. Pupils are equal, round, and reactive to light.  Neck: Normal range of motion. Neck supple.  Cardiovascular: Normal rate and regular rhythm.  Respiratory: Effort normal and breath sounds normal.  GI: Soft. Bowel sounds are normal. He exhibits no distension. There is tenderness in the epigastric area and periumbilical area.  Attempted reduction of the umbilical hernia produces peristaltic sounds consistent with bowel within the hernia.  Incisions c/d/i  Musculoskeletal: Normal range of motion.  Neurological: He is alert and oriented to person, place, and time.  Skin: Skin is warm and dry.  Psychiatric: He has a normal mood and affect. His behavior is normal.     Assessment/Plan 53 yo male s/p Lap RNY gastric bypass. Labs normal. CT scan with loop of intestine in the umbilical hernia with some early signs of dilation. Anastomoses look intake with no hematomas or fluid collections. -Lap umbilical hernia reduction and repair with likely mesh implantation -admit post op to ensure resolution and improved intake and bowel return  Mickeal Skinner, MD 07/09/2017, 1:29 PM

## 2017-07-09 NOTE — Anesthesia Preprocedure Evaluation (Deleted)
Anesthesia Evaluation  Patient identified by MRN, date of birth, ID band Patient awake    Reviewed: Allergy & Precautions, NPO status , Patient's Chart, lab work & pertinent test results, reviewed documented beta blocker date and time   Airway Mallampati: III  TM Distance: >3 FB Neck ROM: Full    Dental no notable dental hx. (+) Teeth Intact, Dental Advisory Given   Pulmonary sleep apnea and Continuous Positive Airway Pressure Ventilation ,    Pulmonary exam normal breath sounds clear to auscultation       Cardiovascular hypertension, Pt. on medications and Pt. on home beta blockers Normal cardiovascular exam Rhythm:Regular Rate:Normal  '18 TTE - Mild concentric LVH. EF 55% to 60%. Grade 2 diastolic dysfunction. Mild MR. Mildly dilated LA.   EKG - Sinus Bradycardia otherwise normal   Neuro/Psych negative neurological ROS  negative psych ROS   GI/Hepatic Neg liver ROS, Incarcerated hernia   Endo/Other  diabetes, Poorly Controlled, Type 2, Oral Hypoglycemic AgentsMorbid obesity  Renal/GU CRFRenal disease     Musculoskeletal  (+) Arthritis , Osteoarthritis,    Abdominal (+) + obese,   Peds  Hematology  (+) anemia ,   Anesthesia Other Findings   Reproductive/Obstetrics                                                              Anesthesia Evaluation  Patient identified by MRN, date of birth, ID band Patient awake    Reviewed: Allergy & Precautions, NPO status , Patient's Chart, lab work & pertinent test results, reviewed documented beta blocker date and time   Airway Mallampati: III  TM Distance: >3 FB Neck ROM: Full    Dental no notable dental hx. (+) Teeth Intact   Pulmonary sleep apnea and Continuous Positive Airway Pressure Ventilation ,    Pulmonary exam normal breath sounds clear to auscultation       Cardiovascular hypertension, Pt. on medications and Pt. on home  beta blockers Normal cardiovascular exam Rhythm:Regular Rate:Normal  Echo- 12/27/2018Left ventricle: The cavity size was normal. There was mild   concentric hypertrophy. Systolic function was normal. The   estimated ejection fraction was in the range of 55% to 60%. Wall   motion was normal; there were no regional wall motion   abnormalities. Features are consistent with a pseudonormal left   ventricular filling pattern, with concomitant abnormal relaxation   and increased filling pressure (grade 2 diastolic dysfunction). - Mitral valve: There was mild regurgitation. - Left atrium: The atrium was mildly dilated.  EKG 04/09/2017 Sinus Bradycardia otherwise normal   Neuro/Psych negative neurological ROS  negative psych ROS   GI/Hepatic negative GI ROS, Neg liver ROS,   Endo/Other  diabetes, Poorly Controlled, Type 2, Oral Hypoglycemic AgentsMorbid obesity  Renal/GU Renal InsufficiencyRenal disease     Musculoskeletal  (+) Arthritis , Osteoarthritis,    Abdominal (+) + obese,   Peds  Hematology  (+) anemia ,   Anesthesia Other Findings   Reproductive/Obstetrics                          Anesthesia Physical Anesthesia Plan  ASA: III  Anesthesia Plan: General   Post-op Pain Management:    Induction: Intravenous  PONV Risk Score and Plan: 4 or  greater and Scopolamine patch - Pre-op, Midazolam, Dexamethasone, Ondansetron and Treatment may vary due to age or medical condition  Airway Management Planned: Oral ETT  Additional Equipment:   Intra-op Plan:   Post-operative Plan: Extubation in OR  Informed Consent: I have reviewed the patients History and Physical, chart, labs and discussed the procedure including the risks, benefits and alternatives for the proposed anesthesia with the patient or authorized representative who has indicated his/her understanding and acceptance.   Dental advisory given  Plan Discussed with: CRNA, Anesthesiologist  and Surgeon  Anesthesia Plan Comments:        Anesthesia Quick Evaluation  Anesthesia Physical  Anesthesia Plan  ASA: III  Anesthesia Plan: General   Post-op Pain Management:    Induction: Intravenous, Rapid sequence and Cricoid pressure planned  PONV Risk Score and Plan: 4 or greater and Midazolam, Dexamethasone, Ondansetron and Treatment may vary due to age or medical condition  Airway Management Planned: Oral ETT and Video Laryngoscope Planned  Additional Equipment: None  Intra-op Plan:   Post-operative Plan: Extubation in OR  Informed Consent: I have reviewed the patients History and Physical, chart, labs and discussed the procedure including the risks, benefits and alternatives for the proposed anesthesia with the patient or authorized representative who has indicated his/her understanding and acceptance.   Dental advisory given  Plan Discussed with: CRNA and Anesthesiologist  Anesthesia Plan Comments: (Previous Grade 3 view with glidecsope , required cricoid pressure for visualization)      Anesthesia Quick Evaluation

## 2017-07-09 NOTE — ED Notes (Addendum)
Pt is alert and oriented x 4 and is verbally responsive. Pt reports generalized abdominal pain x 4 quads 9/10 cramping/throbbing. Pt is escorted by spouse. Pt reports not having had a BM x4 days prior to abdominal surgery . Abdomen appears distended, pt denies passing gas, but is able to Burp. Pt has 2  abdominal laproscopic incisional sites in which are covered in steri strips. No redness or drainage is noted. Pt does have some redness noted around area of umbilical region.

## 2017-07-10 ENCOUNTER — Encounter (HOSPITAL_COMMUNITY): Payer: Self-pay | Admitting: General Surgery

## 2017-07-10 LAB — COMPREHENSIVE METABOLIC PANEL
ALBUMIN: 3 g/dL — AB (ref 3.5–5.0)
ALT: 40 U/L (ref 17–63)
AST: 20 U/L (ref 15–41)
Alkaline Phosphatase: 44 U/L (ref 38–126)
Anion gap: 10 (ref 5–15)
BUN: 52 mg/dL — AB (ref 6–20)
CHLORIDE: 110 mmol/L (ref 101–111)
CO2: 21 mmol/L — AB (ref 22–32)
CREATININE: 3.31 mg/dL — AB (ref 0.61–1.24)
Calcium: 8.3 mg/dL — ABNORMAL LOW (ref 8.9–10.3)
GFR calc Af Amer: 23 mL/min — ABNORMAL LOW (ref >60)
GFR, EST NON AFRICAN AMERICAN: 20 mL/min — AB (ref >60)
GLUCOSE: 155 mg/dL — AB (ref 65–99)
POTASSIUM: 5.4 mmol/L — AB (ref 3.5–5.1)
SODIUM: 141 mmol/L (ref 135–145)
Total Bilirubin: 0.5 mg/dL (ref 0.3–1.2)
Total Protein: 6.3 g/dL — ABNORMAL LOW (ref 6.5–8.1)

## 2017-07-10 LAB — GLUCOSE, CAPILLARY
GLUCOSE-CAPILLARY: 144 mg/dL — AB (ref 65–99)
Glucose-Capillary: 112 mg/dL — ABNORMAL HIGH (ref 65–99)
Glucose-Capillary: 112 mg/dL — ABNORMAL HIGH (ref 65–99)

## 2017-07-10 LAB — CBC WITH DIFFERENTIAL/PLATELET
BASOS ABS: 0 10*3/uL (ref 0.0–0.1)
BASOS PCT: 0 %
Eosinophils Absolute: 0 10*3/uL (ref 0.0–0.7)
Eosinophils Relative: 0 %
HCT: 31.6 % — ABNORMAL LOW (ref 39.0–52.0)
Hemoglobin: 10.2 g/dL — ABNORMAL LOW (ref 13.0–17.0)
LYMPHS PCT: 8 %
Lymphs Abs: 0.6 10*3/uL — ABNORMAL LOW (ref 0.7–4.0)
MCH: 28.4 pg (ref 26.0–34.0)
MCHC: 32.3 g/dL (ref 30.0–36.0)
MCV: 88 fL (ref 78.0–100.0)
Monocytes Absolute: 0.3 10*3/uL (ref 0.1–1.0)
Monocytes Relative: 4 %
Neutro Abs: 6.8 10*3/uL (ref 1.7–7.7)
Neutrophils Relative %: 88 %
Platelets: 194 10*3/uL (ref 150–400)
RBC: 3.59 MIL/uL — AB (ref 4.22–5.81)
RDW: 15.5 % (ref 11.5–15.5)
WBC: 7.7 10*3/uL (ref 4.0–10.5)

## 2017-07-10 MED ORDER — ENOXAPARIN (LOVENOX) PATIENT EDUCATION KIT
PACK | Freq: Once | Status: AC
  Administered 2017-07-11: 09:00:00
  Filled 2017-07-10: qty 1

## 2017-07-10 MED ORDER — ENOXAPARIN SODIUM 30 MG/0.3ML ~~LOC~~ SOLN
30.0000 mg | SUBCUTANEOUS | Status: DC
  Administered 2017-07-10 – 2017-07-11 (×2): 30 mg via SUBCUTANEOUS
  Filled 2017-07-10 (×2): qty 0.3

## 2017-07-10 MED ORDER — POLYETHYLENE GLYCOL 3350 17 G PO PACK
17.0000 g | PACK | Freq: Two times a day (BID) | ORAL | Status: DC
  Administered 2017-07-10 (×2): 17 g via ORAL
  Filled 2017-07-10 (×3): qty 1

## 2017-07-10 MED ORDER — INSULIN ASPART 100 UNIT/ML ~~LOC~~ SOLN
0.0000 [IU] | Freq: Three times a day (TID) | SUBCUTANEOUS | Status: DC
  Administered 2017-07-10: 2 [IU] via SUBCUTANEOUS

## 2017-07-10 MED ORDER — ACETAMINOPHEN 160 MG/5ML PO SOLN
650.0000 mg | Freq: Four times a day (QID) | ORAL | Status: DC
  Administered 2017-07-10 – 2017-07-11 (×3): 650 mg via ORAL
  Filled 2017-07-10 (×3): qty 20.3

## 2017-07-10 MED ORDER — ENOXAPARIN SODIUM 30 MG/0.3ML ~~LOC~~ SOLN: 30.0000 mg | SUBCUTANEOUS | 0 refills | Status: DC

## 2017-07-10 NOTE — Progress Notes (Signed)
  Progress Note: General Surgery Service   Assessment/Plan: Patient Active Problem List   Diagnosis Date Noted  . Morbid obesity ([place]) 07/07/2017  . Back pain 03/27/2017  . Hypertension   . Diabetes mellitus without complication ([place])   . Obesity   . Obstructive sleep apnea 11/06/2015  . Nasal turbinate hypertrophy 11/06/2015  . Rhinitis, chronic 11/06/2015   s/p Procedure(s): LAPAROSCOPIC UMBILICAL HERNIA WITH MESH 07/09/2017 -advance to full liquid diet -add scheduled BID miralax -ambulate -protein shakes -SSI for obesity -change VTE proph to daily lovenox 30mg  subcu due to CKD. Plan to discharge with 30 days of prophylaxis -lovenox education    LOS: 1 day  Chief Complaint/Subjective: Pain well controlled, tolerating liquids, no BM or flatus, ambulating well.  Objective: Vital signs in last 24 hours: Temp:  [97.4 F (36.3 C)-99 F (37.2 C)] 98.8 F (37.1 C) (03/15 0551) Pulse Rate:  [51-86] 68 (03/15 0658) Resp:  [12-20] 16 (03/15 0551) BP: (140-184)/(86-110) 152/86 (03/15 0658) SpO2:  [95 %-100 %] 97 % (03/15 0551) FiO2 (%):  [33 %] 33 % (03/14 1919) Weight:  [149.7 kg (330 lb)] 149.7 kg (330 lb) (03/14 0943) Last BM Date: 07/06/17  Intake/Output from previous day: 03/14 0701 - 03/15 0700 In: 3061.7 [P.O.:645; I.V.:2416.7] Out: 1175 [Urine:1125; Blood:50] Intake/Output this shift: No intake/output data recorded.  Lungs: CTAB  Cardiovascular: RRR  Abd: soft, ATTP, some drainage from needle incisions  Extremities: no edema  Neuro: AOx4  Lab Results: CBC  Recent Labs    07/09/17 1032 07/09/17 1753 07/10/17 0438  WBC 10.2  --  7.7  HGB 12.3* 10.9* 10.2*  HCT 38.1* 33.8* 31.6*  PLT 226  --  194   BMET Recent Labs    07/09/17 1032 07/10/17 0438  NA 141 141  K 4.2 5.4*  CL 106 110  CO2 20* 21*  GLUCOSE 177* 155*  BUN 54* 52*  CREATININE 3.63* 3.31*  CALCIUM 8.9 8.3*   PT/INR No results for input(s): LABPROT, INR in the last 72  hours. ABG No results for input(s): PHART, HCO3 in the last 72 hours.  Invalid input(s): PCO2, PO2  Studies/Results:  Anti-infectives: Anti-infectives (From admission, onward)   Start     Dose/Rate Route Frequency Ordered Stop   07/09/17 1445  ceFAZolin (ANCEF) 3 g in dextrose 5 % 50 mL IVPB     3 g 130 mL/hr over 30 Minutes Intravenous To Surgery 07/09/17 1426 07/09/17 1549      Medications: Scheduled Meds: . acetaminophen  650 mg Oral Q6H  . amLODipine  10 mg Oral Daily  . carvedilol  12.5 mg Oral BID  . doxazosin  4 mg Oral QHS  . enoxaparin (LOVENOX) injection  30 mg Subcutaneous Q24H  . gabapentin  200 mg Oral Q12H  . insulin aspart  0-15 Units Subcutaneous TID WC  . pantoprazole  40 mg Oral Daily  . polyethylene glycol  17 g Oral BID  . protein supplement shake  2 oz Oral Q2H   Continuous Infusions: . sodium chloride 125 mL/hr at 07/10/17 0257   PRN Meds:.hydrALAZINE, morphine injection, ondansetron (ZOFRAN) IV, oxyCODONE, simethicone  Mickeal Skinner, MD Pg# [phone] [facility]

## 2017-07-10 NOTE — Discharge Instructions (Signed)
Hernia, Adult A hernia is the bulging of an organ or tissue through a weak spot in the muscles of the abdomen (abdominal wall). Hernias develop most often near the navel or groin. There are many kinds of hernias. Common kinds include:  Femoral hernia. This kind of hernia develops under the groin in the upper thigh area.  Inguinal hernia. This kind of hernia develops in the groin or scrotum.  Umbilical hernia. This kind of hernia develops near the navel.  Hiatal hernia. This kind of hernia causes part of the stomach to be pushed up into the chest.  Incisional hernia. This kind of hernia bulges through a scar from an abdominal surgery.  What are the causes? This condition may be caused by:  Heavy lifting.  Coughing over a long period of time.  Straining to have a bowel movement.  An incision made during an abdominal surgery.  A birth defect (congenital defect).  Excess weight or obesity.  Smoking.  Poor nutrition.  Cystic fibrosis.  Excess fluid in the abdomen.  Undescended testicles.  What are the signs or symptoms? Symptoms of a hernia include:  A lump on the abdomen. This is the first sign of a hernia. The lump may become more obvious with standing, straining, or coughing. It may get bigger over time if it is not treated or if the condition causing it is not treated.  Pain. A hernia is usually painless, but it may become painful over time if treatment is delayed. The pain is usually dull and may get worse with standing or lifting heavy objects.  Sometimes a hernia gets tightly squeezed in the weak spot (strangulated) or stuck there (incarcerated) and causes additional symptoms. These symptoms may include:  Vomiting.  Nausea.  Constipation.  Irritability.  How is this diagnosed? A hernia may be diagnosed with:  A physical exam. During the exam your health care provider may ask you to cough or to make a specific movement, because a hernia is usually more  visible when you move.  Imaging tests. These can include: ? X-rays. ? Ultrasound. ? CT scan.  How is this treated? A hernia that is small and painless may not need to be treated. A hernia that is large or painful may be treated with surgery. Inguinal hernias may be treated with surgery to prevent incarceration or strangulation. Strangulated hernias are always treated with surgery, because lack of blood to the trapped organ or tissue can cause it to die. Surgery to treat a hernia involves pushing the bulge back into place and repairing the weak part of the abdomen. Follow these instructions at home:  Avoid straining.  Do not lift anything heavier than 10 lb (4.5 kg).  Lift with your leg muscles, not your back muscles. This helps avoid strain.  When coughing, try to cough gently.  Prevent constipation. Constipation leads to straining with bowel movements, which can make a hernia worse or cause a hernia repair to break down. You can prevent constipation by: ? Eating a high-fiber diet that includes plenty of fruits and vegetables. ? Drinking enough fluids to keep your urine clear or pale yellow. Aim to drink 6-8 glasses of water per day. ? Using a stool softener as directed by your health care provider.  Lose weight, if you are overweight.  Do not use any tobacco products, including cigarettes, chewing tobacco, or electronic cigarettes. If you need help quitting, ask your health care provider.  Keep all follow-up visits as directed by your health care  provider. This is important. Your health care provider may need to monitor your condition. Contact a health care provider if:  You have swelling, redness, and pain in the affected area.  Your bowel habits change. Get help right away if:  You have a fever.  You have abdominal pain that is getting worse.  You feel nauseous or you vomit.  You cannot push the hernia back in place by gently pressing on it while you are lying  down.  The hernia: ? Changes in shape or size. ? Is stuck outside the abdomen. ? Becomes discolored. ? Feels hard or tender. This information is not intended to replace advice given to you by your health care provider. Make sure you discuss any questions you have with your health care provider. Document Released: 04/14/2005 Document Revised: 09/12/2015 Document Reviewed: 02/22/2014 Elsevier Interactive Patient Education  2017 [place] [employer]  Home Care Instructions   These instructions are to help you care for yourself when you go home.  Call: If you have any problems.  Call [phone] and ask for the surgeon on call  If you need immediate help, come to the ER at [facility].   Tell the ER staff that you are a new post-op gastric bypass or gastric sleeve patient   Signs and symptoms to report:  Severe vomiting or nausea o If you cannot keep down clear liquids for longer than 1 day, call your surgeon   Abdominal pain that does not get better after taking your pain medication  Fever over 100.4 F with chills  Heart beating over 100 beats a minute  Shortness of breath at rest  Chest pain   Redness, swelling, drainage, or foul odor at incision (surgical) sites   If your incisions open or pull apart  Swelling or pain in calf (lower leg)  Diarrhea (Loose bowel movements that happen often), frequent watery, uncontrolled bowel movements  Constipation, (no bowel movements for 3 days) if this happens: Pick one o Milk of Magnesia, 2 tablespoons by mouth, 3 times a day for 2 days if needed o Stop taking Milk of Magnesia once you have a bowel movement o Call your doctor if constipation continues Or o Miralax  (instead of Milk of Magnesia) following the label instructions o Stop taking Miralax once you have a bowel movement o Call your doctor if constipation continues  Anything you think is not normal   Normal side effects after surgery:   Unable to sleep at night or unable to focus  Irritability or moody  Being tearful (crying) or depressed These are common complaints, possibly related to your anesthesia medications that put you to sleep, stress of surgery, and change in lifestyle.  This usually goes away a few weeks after surgery.  If these feelings continue, call your primary care doctor.   Wound Care: You may have surgical glue, steri-strips, or staples over your incisions after surgery  Surgical glue:  Looks like a clear film over your incisions and will wear off a little at a time  Steri-strips: Strips of tape over your incisions. You may notice a yellowish color on the skin under the steri-strips. This is used to make the   steri-strips stick better. Do not pull the steri-strips off - let them fall off  Staples: Staples may be removed before you leave the hospital o If you go home with staples, call [facility], [phone]) [phone] at for an appointment with your surgeons nurse  to have staples removed 10 days after surgery.  Showering: You may shower two (2) days after your surgery unless your surgeon tells you differently o Wash gently around incisions with warm soapy water, rinse well, and gently pat dry  o No tub baths until staples are removed, steri-strips fall off or glue is gone.    Medications:  Medications should be liquid or crushed if larger than the size of a dime  Extended release pills (medication that release a little bit at a time through the day) should NOT be crushed or cut. (examples include XL, ER, DR, SR)  Depending on the size and number of medications you take, you may need to space (take a few throughout the day)/change the time you take your medications so that you do not over-fill your pouch (smaller stomach)  Make sure you follow-up with your primary care doctor to make medication changes needed during rapid weight loss and life-style changes  If you have diabetes, follow up  with the doctor that orders your diabetes medication(s) within one week after surgery and check your blood sugar regularly.  Do not drive while taking prescription pain medication   It is ok to take Tylenol by the bottle instructions with your pain medicine or instead of your pain medicine as needed.  DO NOT TAKE NSAIDS (EXAMPLES OF NSAIDS:  IBUPROFREN/ NAPROXEN)  Diet:                    First 2 Weeks  You will see the dietician t about two (2) weeks after your surgery. The dietician will increase the types of foods you can eat if you are handling liquids well:  If you have severe vomiting or nausea and cannot keep down clear liquids lasting longer than 1 day, call your surgeon @ [phone]) Protein Shake  Drink at least 2 ounces of shake 5-6 times per day  Each serving of protein shakes (usually 8 - 12 ounces) should have: o 15 grams of protein  o And no more than 5 grams of carbohydrate   Goal for protein each day: o Men = 80 grams per day o Women = 60 grams per day  Protein powder may be added to fluids such as non-fat milk or Lactaid milk or unsweetened Soy/Almond milk (limit to 35 grams added protein powder per serving)  Hydration  Slowly increase the amount of water and other clear liquids as tolerated (See Acceptable Fluids)  Slowly increase the amount of protein shake as tolerated    Sip fluids slowly and throughout the day.  Do not use straws.  May use sugar substitutes in small amounts (no more than 6 - 8 packets per day; i.e. Splenda)  Fluid Goal  The first goal is to drink at least 8 ounces of protein shake/drink per day (or as directed by the nutritionist); some examples of protein shakes are [employer], [employer], EAS Edge HP, and Unjury. See handout from pre-op Bariatric Education Class: o Slowly increase the amount of protein shake you drink as tolerated o You may find it easier to slowly sip shakes throughout the day o It is important to get your  proteins in first  Your fluid goal is to drink 64 - 100 ounces of fluid daily o It may take a few weeks to build up to this  32 oz (or more) should be clear liquids  And   32 oz (or more) should be full liquids (see below for examples)  Liquids should not contain sugar, caffeine, or carbonation  Clear Liquids:  Water or Sugar-free flavored water (i.e. Fruit H2O, Propel)  Decaffeinated coffee or tea (sugar-free)  Crystal Lite, Wylers Lite, Minute Maid Lite  Sugar-free Jell-O  Bouillon or broth  Sugar-free Popsicle:   *Less than 20 calories each; Limit 1 per day  Full Liquids: Protein Shakes/Drinks + 2 choices per day of other full liquids  Full liquids must be: o No More Than 15 grams of Carbs per serving  o No More Than 3 grams of Fat per serving  Strained low-fat cream soup (except Cream of Potato or Tomato)  Non-Fat milk  Fat-free Lactaid Milk  Unsweetened Soy Or Unsweetened Almond Milk  Low Sugar yogurt (Dannon Lite & Fit, Greek yogurt; Oikos Triple Zero; Chobani Simply 100; Yoplait 100 calorie [place] - No Fruit on the Bottom)    Vitamins and Minerals  Start 1 day after surgery unless otherwise directed by your surgeon  2 Chewable Bariatric Specific Multivitamin / Multimineral Supplement with iron (Example: Bariatric Advantage Multi EA)  Chewable Calcium with Vitamin D-3 (Example: 3 Chewable Calcium Plus 600 with Vitamin D-3) o Take 500 mg three (3) times a day for a total of 1500 mg each day o Do not take all 3 doses of calcium at one time as it may cause constipation, and you can only absorb 500 mg  at a time  o Do not mix multivitamins containing iron with calcium supplements; take 2 hours apart  Menstruating women and those with a history of anemia (a blood disease that causes weakness) may need extra iron o Talk with your doctor to see if you need more iron  Do not stop taking or change any vitamins or minerals until you talk to your dietitian or  surgeon  Your Dietitian and/or surgeon must approve all vitamin and mineral supplements   Activity and Exercise: Limit your physical activity as instructed by your doctor.  It is important to continue walking at home.  During this time, use these guidelines:  Do not lift anything greater than ten (10) pounds for at least two (2) weeks  Do not go back to work or drive until Engineer, production says you can  You may have sex when you feel comfortable  o It is VERY important for male patients to use a reliable birth control method; fertility often increases after surgery  o All hormonal birth control will be ineffective for 30 days after surgery due to medications given during surgery a barrier method must be used. o Do not get pregnant for at least 18 months  Start exercising as soon as your doctor tells you that you can o Make sure your doctor approves any physical activity  Start with a simple walking program  Walk 5-15 minutes each day, 7 days per week.   Slowly increase until you are walking 30-45 minutes per day Consider joining our [place] program. [phone] or email [email or web address]   Special Instructions Things to remember:  Use your CPAP when sleeping if this applies to you   [facility] has two free Bariatric Surgery Support Groups that meet monthly o The 3rd Thursday of each month, 6 pm, [facility]  o The 2nd Friday of each month, 11:45 am in the private dining room in the basement of [place]  It is very important to keep all follow up appointments with your surgeon, dietitian, primary care physician, and behavioral health practitioner  Routine follow  up schedule with your surgeon include appointments at 2-3 weeks, 6-8 weeks, 6 months, and 1 year at a minimum.  Your surgeon may request to see you more often.   o After the first year, please follow up with your bariatric surgeon and dietitian at least once a year in order to maintain  best weight loss results [facility]: [place]: [phone] Bariatric Nurse Coordinator: [phone]      Reviewed and Endorsed  by [facility] Patient Education Committee, June, 2016 Edits Approved: Aug, 2018   Enoxaparin injection What is this medicine? ENOXAPARIN (ee nox a PA rin) is used after knee, hip, or abdominal surgeries to prevent blood clotting. It is also used to treat existing blood clots in the lungs or in the veins. This medicine may be used for other purposes; ask your health care provider or pharmacist if you have questions. COMMON BRAND NAME(S): Lovenox What should I tell my health care provider before I take this medicine? They need to know if you have any of these conditions: -bleeding disorders, hemorrhage, or hemophilia -infection of the heart or heart valves -kidney or liver disease -previous stroke -prosthetic heart valve -recent surgery or delivery of a baby -ulcer in the stomach or intestine, diverticulitis, or other bowel disease -an unusual or allergic reaction to enoxaparin, heparin, pork or pork products, other medicines, foods, dyes, or preservatives -pregnant or trying to get pregnant -breast-feeding How should I use this medicine? This medicine is for injection under the skin. It is usually given by a health-care professional. You or a family member may be trained on how to give the injections. If you are to give yourself injections, make sure you understand how to use the syringe, measure the dose if necessary, and give the injection. To avoid bruising, do not rub the site where this medicine has been injected. Do not take your medicine more often than directed. Do not stop taking except on the advice of your doctor or health care professional. Make sure you receive a puncture-resistant container to dispose of the needles and syringes once you have finished with them. Do not reuse  these items. Return the container to your doctor or health care professional for proper disposal. Talk to your pediatrician regarding the use of this medicine in children. Special care may be needed. Overdosage: If you think you have taken too much of this medicine contact a poison control center or emergency room at once. NOTE: This medicine is only for you. Do not share this medicine with others. What if I miss a dose? If you miss a dose, take it as soon as you can. If it is almost time for your next dose, take only that dose. Do not take double or extra doses. What may interact with this medicine? -aspirin and aspirin-like medicines -certain medicines that treat or prevent blood clots -dipyridamole -NSAIDs, medicines for pain and inflammation, like ibuprofen or naproxen This list may not describe all possible interactions. Give your health care provider a list of all the medicines, herbs, non-prescription drugs, or dietary supplements you use. Also tell them if you smoke, drink alcohol, or use illegal drugs. Some items may interact with your medicine. What should I watch for while using this medicine? Visit your doctor or health care professional for regular checks on your progress. Your condition will be monitored carefully while you are receiving this medicine. Notify your doctor or health care professional and seek emergency treatment if  you develop breathing problems; changes in vision; chest pain; severe, sudden headache; pain, swelling, warmth in the leg; trouble speaking; sudden numbness or weakness of the face, arm, or leg. These can be signs that your condition has gotten worse. If you are going to have surgery, tell your doctor or health care professional that you are taking this medicine. Do not stop taking this medicine without first talking to your doctor. Be sure to refill your prescription before you run out of medicine. Avoid sports and activities that might cause injury while you  are using this medicine. Severe falls or injuries can cause unseen bleeding. Be careful when using sharp tools or knives. Consider using an Copy. Take special care brushing or flossing your teeth. Report any injuries, bruising, or red spots on the skin to your doctor or health care professional. What side effects may I notice from receiving this medicine? Side effects that you should report to your doctor or health care professional as soon as possible: -allergic reactions like skin rash, itching or hives, swelling of the face, lips, or tongue -feeling faint or lightheaded, falls -signs and symptoms of bleeding such as bloody or black, tarry stools; red or dark-brown urine; spitting up blood or brown material that looks like coffee grounds; red spots on the skin; unusual bruising or bleeding from the eye, gums, or nose Side effects that usually do not require medical attention (report to your doctor or health care professional if they continue or are bothersome): -pain, redness, or irritation at site where injected This list may not describe all possible side effects. Call your doctor for medical advice about side effects. You may report side effects to FDA at 1-800-FDA-1088. Where should I keep my medicine? Keep out of the reach of children. Store at room temperature between 15 and 30 degrees C (59 and 86 degrees F). Do not freeze. If your injections have been specially prepared, you may need to store them in the refrigerator. Ask your pharmacist. Throw away any unused medicine after the expiration date. NOTE: This sheet is a summary. It may not cover all possible information. If you have questions about this medicine, talk to your doctor, pharmacist, or health care provider.  2018 Elsevier/Gold Standard (2013-08-16 16:06:21)

## 2017-07-10 NOTE — Anesthesia Postprocedure Evaluation (Signed)
Anesthesia Post Note  Patient: [name]  Procedure(s) Performed: LAPAROSCOPIC UMBILICAL HERNIA WITH MESH (N/A )     Patient location during evaluation: PACU Anesthesia Type: General Level of consciousness: awake and alert Pain management: pain level controlled Vital Signs Assessment: post-procedure vital signs reviewed and stable Respiratory status: spontaneous breathing, nonlabored ventilation, respiratory function stable and patient connected to nasal cannula oxygen Cardiovascular status: blood pressure returned to baseline and stable Postop Assessment: no apparent nausea or vomiting Anesthetic complications: no    Last Vitals:  Vitals:   07/09/17 2132 07/09/17 2241  BP: (!) 164/89 (!) 160/92  Pulse: (!) 59 65  Resp: 16   Temp: 37.1 C   SpO2: 99%     Last Pain:  Vitals:   07/09/17 2342  TempSrc:   PainSc: 0-No pain                 Audry Pili

## 2017-07-10 NOTE — Progress Notes (Signed)
Patient alert and oriented.  In bed with family at bedside.  Patient drinking protein shakes.  On his second premiere for today.  Continues to drink bari full fluids.  We discussed the patient will discharge on Lovenox.  No questions at this time.  Contact information left for staff if questions arise this weekend.    [place] Bariatric Nurse Coordinator  Pager # [phone]

## 2017-07-11 LAB — CBC WITH DIFFERENTIAL/PLATELET
BASOS ABS: 0 10*3/uL (ref 0.0–0.1)
Basophils Relative: 0 %
Eosinophils Absolute: 0 10*3/uL (ref 0.0–0.7)
Eosinophils Relative: 0 %
HCT: 30.8 % — ABNORMAL LOW (ref 39.0–52.0)
HEMOGLOBIN: 9.8 g/dL — AB (ref 13.0–17.0)
LYMPHS PCT: 16 %
Lymphs Abs: 1.2 10*3/uL (ref 0.7–4.0)
MCH: 28.2 pg (ref 26.0–34.0)
MCHC: 31.8 g/dL (ref 30.0–36.0)
MCV: 88.8 fL (ref 78.0–100.0)
Monocytes Absolute: 0.7 10*3/uL (ref 0.1–1.0)
Monocytes Relative: 10 %
NEUTROS PCT: 74 %
Neutro Abs: 5.6 10*3/uL (ref 1.7–7.7)
Platelets: 206 10*3/uL (ref 150–400)
RBC: 3.47 MIL/uL — AB (ref 4.22–5.81)
RDW: 15.5 % (ref 11.5–15.5)
WBC: 7.5 10*3/uL (ref 4.0–10.5)

## 2017-07-11 LAB — GLUCOSE, CAPILLARY: Glucose-Capillary: 110 mg/dL — ABNORMAL HIGH (ref 65–99)

## 2017-07-11 MED ORDER — OXYCODONE HCL 5 MG/5ML PO SOLN: 5.0000 mg | Freq: Four times a day (QID) | ORAL | 0 refills | Status: DC | PRN

## 2017-07-11 NOTE — Progress Notes (Signed)
Nurse reviewed discharge instructions with pt and his wife.  Pt verbalized understanding of discharge instructions, follow up appointments and new medications.  Pt given Lovenox teaching kit; nurse taught and demonstrated how to give lovenox injection.  Pt's wife administered lovenox to pt prior to discharge. Pt and wife feel comfortable with giving lovenox at home.  Prescription given to pt prior to discharge.

## 2017-07-11 NOTE — Discharge Summary (Signed)
Physician Discharge Summary  Patient ID: [name] MRN: [id number] DOB/AGE: Sep 13, 1965 52 y.o.  Admit date: 07/09/2017 Discharge date: 07/11/2017  Admission Diagnoses:  Discharge Diagnoses:  Active Problems:   Morbid obesity ([place])   Discharged Condition: good  Hospital Course: 52 yo male who recently underwent RNY gastric bypass represented with bowel obstruction due to umbilical hernia and underwent urgent repair. Post op he did well his pain was controlled with pain medications and he was able to ambulate POD 0. He tolerated bariatric full liquids and had a bowel movement on POD 2. He was discharged home POD 2  Consults: None  Significant Diagnostic Studies:  CBC    Component Value Date/Time   WBC 7.5 07/11/2017 0501   RBC 3.47 (L) 07/11/2017 0501   HGB 9.8 (L) 07/11/2017 0501   HCT 30.8 (L) 07/11/2017 0501   PLT 206 07/11/2017 0501   MCV 88.8 07/11/2017 0501   MCH 28.2 07/11/2017 0501   MCHC 31.8 07/11/2017 0501   RDW 15.5 07/11/2017 0501   LYMPHSABS 1.2 07/11/2017 0501   MONOABS 0.7 07/11/2017 0501   EOSABS 0.0 07/11/2017 0501   BASOSABS 0.0 07/11/2017 0501     Treatments: IV hydration  Discharge Exam: Blood pressure (!) 159/97, pulse (!) 51, temperature 97.7 F (36.5 C), temperature source Oral, resp. rate 17, height 5\' 8"  (1.727 m), weight (!) 149.7 kg (330 lb), SpO2 91 %. General appearance: alert and cooperative Head: Normocephalic, without obvious abnormality, atraumatic Resp: clear to auscultation bilaterally Cardio: regular rate and rhythm, S1, S2 normal, no murmur, click, rub or gallop GI: soft, non-tender; bowel sounds normal; no masses,  no organomegaly and incisions c/d/i, tender around umbilicus, no erythema  Disposition: Discharge disposition: 01-Home or Self Care       Discharge Instructions    Ambulate hourly while awake   Complete by:  As directed    Call MD for:  difficulty breathing, headache or visual disturbances   Complete by:   As directed    Call MD for:  persistant dizziness or light-headedness   Complete by:  As directed    Call MD for:  persistant nausea and vomiting   Complete by:  As directed    Call MD for:  redness, tenderness, or signs of infection (pain, swelling, redness, odor or green/yellow discharge around incision site)   Complete by:  As directed    Call MD for:  severe uncontrolled pain   Complete by:  As directed    Call MD for:  temperature >101 F   Complete by:  As directed    Diet bariatric full liquid   Complete by:  As directed    Discharge wound care:   Complete by:  As directed    Remove Bandaids tomorrow, ok to shower tomorrow. Steristrips may fall off in 1-3 weeks.   Incentive spirometry   Complete by:  As directed    Perform hourly while awake     Allergies as of 07/11/2017   No Known Allergies     Medication List    TAKE these medications   acetaminophen 500 MG tablet Commonly known as:  TYLENOL Take 1,000 mg by mouth every 6 (six) hours as needed (for pain.).   amLODipine 10 MG tablet Commonly known as:  NORVASC Take 10 mg by mouth daily.   aspirin 81 MG chewable tablet Chew 1 tablet (81 mg total) by mouth daily. Start taking on:  07/12/2017   carvedilol 12.5 MG tablet Commonly known as:  COREG Take 1 tablet (12.5 mg total) by mouth 2 (two) times daily.   doxazosin 4 MG tablet Commonly known as:  CARDURA Take 4 mg by mouth at bedtime.   enoxaparin 30 MG/0.3ML injection Commonly known as:  LOVENOX Inject 0.3 mLs (30 mg total) into the skin daily.   isosorbide-hydrALAZINE 20-37.5 MG tablet Commonly known as:  BIDIL Take 2 tablets by mouth 3 (three) times daily.   metoprolol succinate 100 MG 24 hr tablet Commonly known as:  TOPROL-XL Take 100 mg by mouth daily.   NAFTIN 2 % Gel Generic drug:  Naftifine HCl Apply 1 application topically daily as needed.   ondansetron 4 MG disintegrating tablet Commonly known as:  ZOFRAN ODT Take 1 tablet (4 mg total)  by mouth every 8 (eight) hours as needed for nausea or vomiting.   oxyCODONE 5 MG/5ML solution Commonly known as:  ROXICODONE Take 5 mLs (5 mg total) by mouth every 6 (six) hours as needed for up to 40 doses for severe pain.   pantoprazole 40 MG tablet Commonly known as:  PROTONIX Take 1 tablet (40 mg total) by mouth daily.   traMADol 50 MG tablet Commonly known as:  ULTRAM Take 1 tablet (50 mg total) by mouth every 8 (eight) hours as needed for up to 1 dose.            Discharge Care Instructions  (From admission, onward)        Start     Ordered   07/11/17 0000  Discharge wound care:    Comments:  Remove Bandaids tomorrow, ok to shower tomorrow. Steristrips may fall off in 1-3 weeks.   07/11/17 0757       Signed: [name] [name] 07/11/2017, 7:58 AM

## 2017-07-13 ENCOUNTER — Telehealth (HOSPITAL_COMMUNITY): Payer: Self-pay

## 2017-07-13 NOTE — Telephone Encounter (Signed)
Patient called to discuss post bariatric surgery follow up questions.  See below:   1.  Tell me about your pain and pain management?taking pain medication due to hernia surgery  2.  Let's talk about fluid intake.  How much total fluid are you taking in?64 ounces of fluid  3.  How much protein have you taken in the last 2 days?90 grams of protein  4.  Have you had nausea?  Tell me about when have experienced nausea and what you did to help?patient has not experienced nausea  5.  Has the frequency or color changed with your urine?making a lot of urine light in color  6.  Tell me what your incisions look like?no problems with the incisions  7.  Have you been passing gas? BM?yes had bm this weekend  8.  If a problem or question were to arise who would you call?  Do you know contact numbers for [place], [facility], and [facility]?patient understands how to contact all services  9.  How has the walking going?ambulating frequently in home  10.  How are your vitamins and calcium going?  How are you taking them?patient is on the proper vitamin and calcium regiment.  No questions at this time

## 2017-07-21 ENCOUNTER — Encounter: Payer: 59 | Attending: General Surgery | Admitting: Skilled Nursing Facility1

## 2017-07-21 DIAGNOSIS — M1990 Unspecified osteoarthritis, unspecified site: Secondary | ICD-10-CM | POA: Insufficient documentation

## 2017-07-21 DIAGNOSIS — I10 Essential (primary) hypertension: Secondary | ICD-10-CM | POA: Diagnosis not present

## 2017-07-21 DIAGNOSIS — Z8249 Family history of ischemic heart disease and other diseases of the circulatory system: Secondary | ICD-10-CM | POA: Insufficient documentation

## 2017-07-21 DIAGNOSIS — Z7984 Long term (current) use of oral hypoglycemic drugs: Secondary | ICD-10-CM | POA: Insufficient documentation

## 2017-07-21 DIAGNOSIS — Z79899 Other long term (current) drug therapy: Secondary | ICD-10-CM | POA: Insufficient documentation

## 2017-07-21 DIAGNOSIS — E119 Type 2 diabetes mellitus without complications: Secondary | ICD-10-CM

## 2017-07-21 DIAGNOSIS — G4730 Sleep apnea, unspecified: Secondary | ICD-10-CM | POA: Diagnosis not present

## 2017-07-21 DIAGNOSIS — Z713 Dietary counseling and surveillance: Secondary | ICD-10-CM | POA: Insufficient documentation

## 2017-07-21 DIAGNOSIS — Z6841 Body Mass Index (BMI) 40.0 and over, adult: Secondary | ICD-10-CM | POA: Insufficient documentation

## 2017-07-22 ENCOUNTER — Encounter: Payer: Self-pay | Admitting: Skilled Nursing Facility1

## 2017-07-22 NOTE — Progress Notes (Signed)
Bariatric Class:  Appt start time: 1530 end time:  1630.  2 Week Post-Operative Nutrition Class  Patient was seen on 07/22/2017 for Post-Operative Nutrition education at [facility].   Testing blood sugar 5 days a week: 90-120. Pt states he is not that worried about weight and cares more about how he feels.    Surgery date: 07/07/2017 Surgery type: RYGB Start weight at [facility]: 338.2 Weight today: pt declined  TANITA  BODY COMP RESULTS  Pt declined   BMI (kg/m^2)    Fat Mass (lbs)    Fat Free Mass (lbs)    Total Body Water (lbs)    The following the learning objectives were met by the patient during this course:  Identifies Phase 3A (Soft, High Proteins) Dietary Goals and will begin from 2 weeks post-operatively to 2 months post-operatively  Identifies appropriate sources of fluids and proteins   States protein recommendations and appropriate sources post-operatively  Identifies the need for appropriate texture modifications, mastication, and bite sizes when consuming solids  Identifies appropriate multivitamin and calcium sources post-operatively  Describes the need for physical activity post-operatively and will follow MD recommendations  States when to call healthcare provider regarding medication questions or post-operative complications  Handouts given during class include:  Phase 3A: Soft, High Protein Diet Handout  Follow-Up Plan: Patient will follow-up at [facility] in 6 weeks for 2 month post-op nutrition visit for diet advancement per MD.

## 2017-07-24 ENCOUNTER — Telehealth: Payer: Self-pay | Admitting: Registered"

## 2017-07-24 NOTE — Telephone Encounter (Signed)
RD called pt to verify fluid intake once restarting soft, solid proteins 2 week post-bariatric surgery.   Daily Fluid intake: 64+ ounces of water Daily Protein intake: still working on getting 80 grams; tracking  Concerns/issues: small amount of pain

## 2017-07-27 ENCOUNTER — Inpatient Hospital Stay (HOSPITAL_COMMUNITY)
Admission: EM | Admit: 2017-07-27 | Discharge: 2017-07-30 | DRG: 554 | Disposition: A | Discharge status: Home / Self Care | Payer: 59 | Attending: Internal Medicine | Admitting: Internal Medicine

## 2017-07-27 ENCOUNTER — Emergency Department (HOSPITAL_BASED_OUTPATIENT_CLINIC_OR_DEPARTMENT_OTHER)
Admit: 2017-07-27 | Discharge: 2017-07-27 | Disposition: A | Discharge status: Home / Self Care | Payer: 59 | Attending: Emergency Medicine | Admitting: Emergency Medicine

## 2017-07-27 ENCOUNTER — Encounter (HOSPITAL_COMMUNITY): Payer: Self-pay

## 2017-07-27 ENCOUNTER — Inpatient Hospital Stay (HOSPITAL_COMMUNITY): Payer: 59

## 2017-07-27 ENCOUNTER — Emergency Department (HOSPITAL_COMMUNITY): Payer: 59

## 2017-07-27 ENCOUNTER — Other Ambulatory Visit: Payer: Self-pay

## 2017-07-27 DIAGNOSIS — M25461 Effusion, right knee: Secondary | ICD-10-CM | POA: Diagnosis present

## 2017-07-27 DIAGNOSIS — K219 Gastro-esophageal reflux disease without esophagitis: Secondary | ICD-10-CM | POA: Diagnosis present

## 2017-07-27 DIAGNOSIS — E1122 Type 2 diabetes mellitus with diabetic chronic kidney disease: Secondary | ICD-10-CM | POA: Diagnosis present

## 2017-07-27 DIAGNOSIS — Z6841 Body Mass Index (BMI) 40.0 and over, adult: Secondary | ICD-10-CM

## 2017-07-27 DIAGNOSIS — M1990 Unspecified osteoarthritis, unspecified site: Secondary | ICD-10-CM | POA: Insufficient documentation

## 2017-07-27 DIAGNOSIS — I10 Essential (primary) hypertension: Secondary | ICD-10-CM | POA: Diagnosis not present

## 2017-07-27 DIAGNOSIS — E872 Acidosis, unspecified: Secondary | ICD-10-CM

## 2017-07-27 DIAGNOSIS — M109 Gout, unspecified: Principal | ICD-10-CM | POA: Diagnosis present

## 2017-07-27 DIAGNOSIS — E790 Hyperuricemia without signs of inflammatory arthritis and tophaceous disease: Secondary | ICD-10-CM | POA: Diagnosis not present

## 2017-07-27 DIAGNOSIS — I129 Hypertensive chronic kidney disease with stage 1 through stage 4 chronic kidney disease, or unspecified chronic kidney disease: Secondary | ICD-10-CM | POA: Diagnosis present

## 2017-07-27 DIAGNOSIS — D631 Anemia in chronic kidney disease: Secondary | ICD-10-CM | POA: Diagnosis present

## 2017-07-27 DIAGNOSIS — M79604 Pain in right leg: Secondary | ICD-10-CM | POA: Diagnosis not present

## 2017-07-27 DIAGNOSIS — Z8249 Family history of ischemic heart disease and other diseases of the circulatory system: Secondary | ICD-10-CM | POA: Diagnosis not present

## 2017-07-27 DIAGNOSIS — Z7901 Long term (current) use of anticoagulants: Secondary | ICD-10-CM | POA: Diagnosis not present

## 2017-07-27 DIAGNOSIS — R609 Edema, unspecified: Secondary | ICD-10-CM | POA: Diagnosis not present

## 2017-07-27 DIAGNOSIS — M79609 Pain in unspecified limb: Secondary | ICD-10-CM

## 2017-07-27 DIAGNOSIS — M19071 Primary osteoarthritis, right ankle and foot: Secondary | ICD-10-CM | POA: Diagnosis present

## 2017-07-27 DIAGNOSIS — M19072 Primary osteoarthritis, left ankle and foot: Secondary | ICD-10-CM | POA: Diagnosis present

## 2017-07-27 DIAGNOSIS — Z7982 Long term (current) use of aspirin: Secondary | ICD-10-CM | POA: Diagnosis not present

## 2017-07-27 DIAGNOSIS — M25561 Pain in right knee: Secondary | ICD-10-CM | POA: Diagnosis present

## 2017-07-27 DIAGNOSIS — Z9884 Bariatric surgery status: Secondary | ICD-10-CM | POA: Diagnosis not present

## 2017-07-27 DIAGNOSIS — M7989 Other specified soft tissue disorders: Secondary | ICD-10-CM | POA: Diagnosis not present

## 2017-07-27 DIAGNOSIS — I2510 Atherosclerotic heart disease of native coronary artery without angina pectoris: Secondary | ICD-10-CM | POA: Diagnosis present

## 2017-07-27 DIAGNOSIS — N183 Chronic kidney disease, stage 3 (moderate): Secondary | ICD-10-CM | POA: Diagnosis present

## 2017-07-27 DIAGNOSIS — Z96651 Presence of right artificial knee joint: Secondary | ICD-10-CM | POA: Diagnosis present

## 2017-07-27 DIAGNOSIS — R52 Pain, unspecified: Secondary | ICD-10-CM

## 2017-07-27 LAB — BLOOD GAS, ARTERIAL
ACID-BASE DEFICIT: 6.2 mmol/L — AB (ref 0.0–2.0)
Bicarbonate: 18.1 mmol/L — ABNORMAL LOW (ref 20.0–28.0)
DRAWN BY: 270211
O2 Saturation: 95.4 %
PCO2 ART: 33.2 mmHg (ref 32.0–48.0)
PH ART: 7.356 (ref 7.350–7.450)
Patient temperature: 98.6
pO2, Arterial: 81.9 mmHg — ABNORMAL LOW (ref 83.0–108.0)

## 2017-07-27 LAB — COMPREHENSIVE METABOLIC PANEL
ALBUMIN: 3 g/dL — AB (ref 3.5–5.0)
ALK PHOS: 65 U/L (ref 38–126)
ALT: 18 U/L (ref 17–63)
ANION GAP: 18 — AB (ref 5–15)
AST: 11 U/L — AB (ref 15–41)
BUN: 36 mg/dL — AB (ref 6–20)
CALCIUM: 8.7 mg/dL — AB (ref 8.9–10.3)
CO2: 17 mmol/L — ABNORMAL LOW (ref 22–32)
Chloride: 105 mmol/L (ref 101–111)
Creatinine, Ser: 3.26 mg/dL — ABNORMAL HIGH (ref 0.61–1.24)
GFR calc Af Amer: 24 mL/min — ABNORMAL LOW (ref >60)
GFR, EST NON AFRICAN AMERICAN: 20 mL/min — AB (ref >60)
GLUCOSE: 137 mg/dL — AB (ref 65–99)
POTASSIUM: 4.1 mmol/L (ref 3.5–5.1)
Sodium: 140 mmol/L (ref 135–145)
TOTAL PROTEIN: 6.7 g/dL (ref 6.5–8.1)
Total Bilirubin: 0.9 mg/dL (ref 0.3–1.2)

## 2017-07-27 LAB — CBC
HEMATOCRIT: 28.1 % — AB (ref 39.0–52.0)
HEMATOCRIT: 27.9 % — AB (ref 39.0–52.0)
HEMOGLOBIN: 9.1 g/dL — AB (ref 13.0–17.0)
Hemoglobin: 9.1 g/dL — ABNORMAL LOW (ref 13.0–17.0)
MCH: 28 pg (ref 26.0–34.0)
MCH: 28.2 pg (ref 26.0–34.0)
MCHC: 32.4 g/dL (ref 30.0–36.0)
MCHC: 32.6 g/dL (ref 30.0–36.0)
MCV: 86.5 fL (ref 78.0–100.0)
MCV: 86.4 fL (ref 78.0–100.0)
Platelets: 310 10*3/uL (ref 150–400)
Platelets: 307 10*3/uL (ref 150–400)
RBC: 3.25 MIL/uL — ABNORMAL LOW (ref 4.22–5.81)
RBC: 3.23 MIL/uL — AB (ref 4.22–5.81)
RDW: 14.7 % (ref 11.5–15.5)
RDW: 14.8 % (ref 11.5–15.5)
WBC: 8.3 10*3/uL (ref 4.0–10.5)
WBC: 7.2 10*3/uL (ref 4.0–10.5)

## 2017-07-27 LAB — CREATININE, SERUM
Creatinine, Ser: 3.26 mg/dL — ABNORMAL HIGH (ref 0.61–1.24)
GFR calc non Af Amer: 20 mL/min — ABNORMAL LOW (ref >60)
GFR, EST AFRICAN AMERICAN: 24 mL/min — AB (ref >60)

## 2017-07-27 LAB — URINALYSIS, ROUTINE W REFLEX MICROSCOPIC
BILIRUBIN URINE: NEGATIVE
GLUCOSE, UA: NEGATIVE mg/dL
Hgb urine dipstick: NEGATIVE
KETONES UR: 20 mg/dL — AB
LEUKOCYTES UA: NEGATIVE
Nitrite: NEGATIVE
PH: 5 (ref 5.0–8.0)
Protein, ur: >=300 mg/dL — AB
SPECIFIC GRAVITY, URINE: 1.012 (ref 1.005–1.030)
SQUAMOUS EPITHELIAL / LPF: NONE SEEN

## 2017-07-27 LAB — SEDIMENTATION RATE: SED RATE: 128 mm/h — AB (ref 0–16)

## 2017-07-27 LAB — LACTIC ACID, PLASMA: LACTIC ACID, VENOUS: 0.5 mmol/L (ref 0.5–1.9)

## 2017-07-27 LAB — C-REACTIVE PROTEIN: CRP: 20 mg/dL — ABNORMAL HIGH (ref <1.0)

## 2017-07-27 LAB — CK: Total CK: 82 U/L (ref 49–397)

## 2017-07-27 LAB — URIC ACID: URIC ACID, SERUM: 12.9 mg/dL — AB (ref 4.4–7.6)

## 2017-07-27 LAB — BRAIN NATRIURETIC PEPTIDE: B NATRIURETIC PEPTIDE 5: 31.2 pg/mL (ref 0.0–100.0)

## 2017-07-27 MED ORDER — OXYCODONE HCL 5 MG/5ML PO SOLN
5.0000 mg | Freq: Four times a day (QID) | ORAL | Status: DC | PRN
  Administered 2017-07-27 – 2017-07-30 (×8): 5 mg via ORAL
  Filled 2017-07-27 (×8): qty 5

## 2017-07-27 MED ORDER — CARVEDILOL 12.5 MG PO TABS: 12.5000 mg | ORAL_TABLET | Freq: Two times a day (BID) | ORAL | Status: DC

## 2017-07-27 MED ORDER — ENOXAPARIN SODIUM 30 MG/0.3ML ~~LOC~~ SOLN
30.0000 mg | SUBCUTANEOUS | Status: DC
  Administered 2017-07-28 – 2017-07-29 (×2): 30 mg via SUBCUTANEOUS
  Filled 2017-07-27 (×2): qty 0.3

## 2017-07-27 MED ORDER — CARVEDILOL 12.5 MG PO TABS
12.5000 mg | ORAL_TABLET | Freq: Two times a day (BID) | ORAL | Status: DC
  Administered 2017-07-27 – 2017-07-30 (×6): 12.5 mg via ORAL
  Filled 2017-07-27 (×8): qty 1

## 2017-07-27 MED ORDER — ISOSORB DINITRATE-HYDRALAZINE 20-37.5 MG PO TABS
2.0000 | ORAL_TABLET | Freq: Three times a day (TID) | ORAL | Status: DC
  Administered 2017-07-27 – 2017-07-30 (×6): 2 via ORAL
  Filled 2017-07-27 (×10): qty 2

## 2017-07-27 MED ORDER — TRAMADOL HCL 50 MG PO TABS
50.0000 mg | ORAL_TABLET | Freq: Four times a day (QID) | ORAL | Status: DC | PRN
  Filled 2017-07-27: qty 1

## 2017-07-27 MED ORDER — DOXAZOSIN MESYLATE 4 MG PO TABS
4.0000 mg | ORAL_TABLET | Freq: Every day | ORAL | Status: DC
  Administered 2017-07-27 – 2017-07-29 (×3): 4 mg via ORAL
  Filled 2017-07-27 (×4): qty 1

## 2017-07-27 MED ORDER — METOPROLOL SUCCINATE ER 100 MG PO TB24: 100.0000 mg | ORAL_TABLET | Freq: Every day | ORAL | Status: DC

## 2017-07-27 MED ORDER — PANTOPRAZOLE SODIUM 40 MG PO TBEC
40.0000 mg | DELAYED_RELEASE_TABLET | Freq: Every day | ORAL | Status: DC
  Administered 2017-07-28 – 2017-07-30 (×3): 40 mg via ORAL
  Filled 2017-07-27 (×3): qty 1

## 2017-07-27 MED ORDER — ENOXAPARIN SODIUM 30 MG/0.3ML ~~LOC~~ SOLN: 30.0000 mg | SUBCUTANEOUS | Status: DC

## 2017-07-27 MED ORDER — FUROSEMIDE 10 MG/ML IJ SOLN: 40.0000 mg | Freq: Every day | INTRAMUSCULAR | Status: DC

## 2017-07-27 NOTE — ED Notes (Signed)
ED TO INPATIENT HANDOFF REPORT  Name/Age/Gender [name] 52 y.o. male  Code Status    Code Status Orders  (From admission, onward)        Start     Ordered   07/27/17 1805  Full code  Continuous     07/27/17 1804    Code Status History    Date Active Date Inactive Code Status Order ID Comments User Context   07/09/2017 1918 07/11/2017 1308 Full Code [id number]  [name], [name], MD Inpatient   07/07/2017 1136 07/08/2017 1806 Full Code [id number]  [name], [name], MD Inpatient   05/13/2012 1841 05/14/2012 1634 Full Code [id number]  Ron Parker, RN Inpatient      Home/SNF/Other Home  Chief Complaint Leg Swelling  Level of Care/Admitting Diagnosis ED Disposition    ED Disposition Condition [facility] Area: [facility] [[id number]]  Level of Care: Med-Surg [16]  Diagnosis: Arthritis [263785]  Admitting Physician: Georgette Shell [[id number]]  Attending Physician: Georgette Shell [[id number]]  Estimated length of stay: 3 - 4 days  Certification:: I certify this patient will need inpatient services for at least 2 midnights  PT Class (Do Not Modify): Inpatient [101]  PT Acc Code (Do Not Modify): Private [1]       Medical History Past Medical History:  Diagnosis Date  . Anemia    iron infusions in last few months for anemia   . Arthritis    ankles   . Chronic kidney disease    followed by Dr Arty Baumgartner   . Diabetes mellitus without complication ([place])    type II   . Hypertension   . Obesity   . Sinus congestion    all the time  . Sleep apnea    cpap off and on     Allergies No Known Allergies  IV Location/Drains/Wounds Patient Lines/Drains/Airways Status   Active Line/Drains/Airways    Name:   Placement date:   Placement time:   Site:   Days:   Incision (Closed) 07/07/17 Abdomen   07/07/17    0807     20   Incision (Closed) 07/09/17 Abdomen Other (Comment)   07/09/17    1612     18   Incision - 4 Ports  Abdomen Right;Lateral Left;Umbilicus;Mid;Upper Left;Superior;Lateral;Upper Left;Lateral   07/09/17    1545     18   Incision - 6 Ports Abdomen 1: Right;Lateral 2: Right;Medial 3: Left;Umbilicus 4: Mid;Upper 5: Left;Upper 6: Left;Lower   07/07/17    0800     20          Labs/Imaging Results for orders placed or performed during the hospital encounter of 07/27/17 (from the past 48 hour(s))  CBC     Status: Abnormal   Collection Time: 07/27/17 12:42 PM  Result Value Ref Range   WBC 8.3 4.0 - 10.5 K/uL   RBC 3.25 (L) 4.22 - 5.81 MIL/uL   Hemoglobin 9.1 (L) 13.0 - 17.0 g/dL   HCT 28.1 (L) 39.0 - 52.0 %   MCV 86.5 78.0 - 100.0 fL   MCH 28.0 26.0 - 34.0 pg   MCHC 32.4 30.0 - 36.0 g/dL   RDW 14.7 11.5 - 15.5 %   Platelets 310 150 - 400 K/uL    Comment: Performed at [facility], [place] [address] [place], [place] [address]  Comprehensive metabolic panel     Status: Abnormal   Collection Time: 07/27/17 12:42 PM  Result  Value Ref Range   Sodium 140 135 - 145 mmol/L   Potassium 4.1 3.5 - 5.1 mmol/L   Chloride 105 101 - 111 mmol/L   CO2 17 (L) 22 - 32 mmol/L   Glucose, Bld 137 (H) 65 - 99 mg/dL   BUN 36 (H) 6 - 20 mg/dL   Creatinine, Ser 3.26 (H) 0.61 - 1.24 mg/dL   Calcium 8.7 (L) 8.9 - 10.3 mg/dL   Total Protein 6.7 6.5 - 8.1 g/dL   Albumin 3.0 (L) 3.5 - 5.0 g/dL   AST 11 (L) 15 - 41 U/L   ALT 18 17 - 63 U/L   Alkaline Phosphatase 65 38 - 126 U/L   Total Bilirubin 0.9 0.3 - 1.2 mg/dL   GFR calc non Af Amer 20 (L) >60 mL/min   GFR calc Af Amer 24 (L) >60 mL/min    Comment: (NOTE) The eGFR has been calculated using the CKD EPI equation. This calculation has not been validated in all clinical situations. eGFR's persistently <60 mL/min signify possible Chronic Kidney Disease.    Anion gap 18 (H) 5 - 15    Comment: Performed at [facility], [place] [address] [place], [place] [address]  Brain natriuretic peptide     Status: None   Collection Time:  07/27/17 12:42 PM  Result Value Ref Range   B Natriuretic Peptide 31.2 0.0 - 100.0 pg/mL    Comment: Performed at [facility], [place] [address] [place], [place] [address]  CK     Status: None   Collection Time: 07/27/17 12:42 PM  Result Value Ref Range   Total CK 82 49 - 397 U/L    Comment: Performed at [facility], [place] [address], [place], [place] [address]  Blood gas, arterial     Status: Abnormal   Collection Time: 07/27/17  2:58 PM  Result Value Ref Range   Delivery systems ROOM AIR    pH, Arterial 7.356 7.350 - 7.450   pCO2 arterial 33.2 32.0 - 48.0 mmHg   pO2, Arterial 81.9 (L) 83.0 - 108.0 mmHg   Bicarbonate 18.1 (L) 20.0 - 28.0 mmol/L   Acid-base deficit 6.2 (H) 0.0 - 2.0 mmol/L   O2 Saturation 95.4 %   Patient temperature 98.6    Collection site LEFT RADIAL    Drawn by [id number]    Sample type ARTERIAL DRAW    Allens test (pass/fail) PASS PASS    Comment: Performed at [facility], [place] [address] [place], [place] [address]  Lactic acid, plasma     Status: None   Collection Time: 07/27/17  3:01 PM  Result Value Ref Range   Lactic Acid, Venous 0.5 0.5 - 1.9 mmol/L    Comment: Performed at [facility], [place] [address] [place], [place] [address]  Urinalysis, Routine w reflex microscopic     Status: Abnormal   Collection Time: 07/27/17  3:01 PM  Result Value Ref Range   Color, Urine YELLOW YELLOW   APPearance CLEAR CLEAR   Specific Gravity, Urine 1.012 1.005 - 1.030   pH 5.0 5.0 - 8.0   Glucose, UA NEGATIVE NEGATIVE mg/dL   Hgb urine dipstick NEGATIVE NEGATIVE   Bilirubin Urine NEGATIVE NEGATIVE   Ketones, ur 20 (A) NEGATIVE mg/dL   Protein, ur >=300 (A) NEGATIVE mg/dL   Nitrite NEGATIVE NEGATIVE   Leukocytes, UA NEGATIVE NEGATIVE   RBC / HPF 0-5 0 - 5 RBC/hpf   WBC, UA  0-5 0 - 5 WBC/hpf   Bacteria, UA RARE (A) NONE SEEN   Squamous Epithelial / LPF NONE SEEN NONE SEEN   Mucus PRESENT      Comment: Performed at [facility], [place] [address], [place], [place] [address]   Dg Chest 2 View  Result Date: 07/27/2017 CLINICAL DATA:  Bilateral leg swelling over the last 5 days. EXAM: CHEST - 2 VIEW COMPARISON:  01/21/2017 FINDINGS: Borderline cardiomegaly. The lungs are clear. Vascularity within normal limits. No effusions. No significant bone finding. IMPRESSION: No active cardiopulmonary disease. Electronically Signed   By: Nelson Chimes M.D.   On: 07/27/2017 14:47    Pending Labs Unresulted Labs (From admission, onward)   Start     Ordered   08/03/17 0500  Creatinine, serum  (enoxaparin (LOVENOX)    CrCl < 30 ml/min)  Weekly,   R    Comments:  while on enoxaparin therapy.    07/27/17 1804   07/28/17 0500  CBC  Tomorrow morning,   R     07/27/17 1804   07/28/17 0233  Basic metabolic panel  Tomorrow morning,   R     07/27/17 1804   07/27/17 1811  Uric acid  Once,   R     07/27/17 1812   07/27/17 1810  Sedimentation rate  Once,   R     07/27/17 1812   07/27/17 1810  C-reactive protein  Once,   R     07/27/17 1812   07/27/17 1804  HIV antibody (Routine Testing)  Once,   R     07/27/17 1804   07/27/17 1804  CBC  (enoxaparin (LOVENOX)    CrCl < 30 ml/min)  Once,   R    Comments:  Baseline for enoxaparin therapy IF NOT ALREADY DRAWN.  Notify MD if PLT < 100 K.    07/27/17 1804   07/27/17 1804  Creatinine, serum  (enoxaparin (LOVENOX)    CrCl < 30 ml/min)  Once,   R    Comments:  Baseline for enoxaparin therapy IF NOT ALREADY DRAWN.    07/27/17 1804      Vitals/Pain Today's Vitals   07/27/17 1630 07/27/17 1700 07/27/17 1730 07/27/17 1800  BP: 133/71 129/80 (!) 144/87 140/77  Pulse: 81 84 85 80  Resp: _0 Temp:      TempSrc:      SpO2: 98% 91% 92% 98%  Weight:      Height:      PainSc:        Isolation Precautions No active isolations  Medications Medications  doxazosin (CARDURA) tablet 4 mg (has no administration in time range)   isosorbide-hydrALAZINE (BIDIL) 20-37.5 MG per tablet 2 tablet (has no administration in time range)  oxyCODONE (ROXICODONE) 5 MG/5ML solution 5 mg (has no administration in time range)  pantoprazole (PROTONIX) EC tablet 40 mg (has no administration in time range)  traMADol (ULTRAM) tablet 50 mg (has no administration in time range)  enoxaparin (LOVENOX) injection 30 mg (has no administration in time range)  carvedilol (COREG) tablet 12.5 mg (has no administration in time range)  furosemide (LASIX) injection 40 mg (has no administration in time range)    Mobility non-ambulatory

## 2017-07-27 NOTE — ED Triage Notes (Signed)
Patient c/o bilateral leg swelling x 5 days. Patient states he is unable to ambulate due to pain and swelling x 2 days.R>L

## 2017-07-27 NOTE — H&P (Signed)
History and Physical    [name] DGL:[id number] DOB: 1965-08-21 DOA: 07/27/2017  PCP: Charolette Forward, MD Patient coming from: Home  Chief Complaint: Right leg pain  HPI: [name] is a 52 y.o. male with medical history significant of morbid obesity, CKD, diabetes type 2, hypertension, sleep apnea, anemia status post recent gastric bypass July 07, 2017 admitted with complaints of right leg pain right ankle pain right knee pain.  He reports he is unable to walk because of the pain and has increased swelling compared to his normal.  He also reports he was on Lasix prior to the surgery and was taken of Lasix.  Denies history of trauma or gout.  He has had a knee  replacement on that right knee in the past.  He denies any fever chills chest pain nausea vomiting diarrhea or cough.  He reports that he often gets shots into his right ankle and sometimes to his right knee.  However he has since seen [facility] with doctor in a long time.    ED Course: Venous Doppler of the right leg shows no evidence of DVT.  Lactic acid 0.5.  UA no ketones or leukocytes or nitrites.  BNP 31.2 CPK 82 white count 8.3 hemoglobin 9.1 platelets 310, LFTs normal.  BUN 36 creatinine 3.26 which is his baseline.  Review of Systems: As per HPI otherwise all other systems reviewed and are negative  Ambulatory Status  Past Medical History:  Diagnosis Date  . Anemia    iron infusions in last few months for anemia   . Arthritis    ankles   . Chronic kidney disease    followed by Dr Arty Baumgartner   . Diabetes mellitus without complication ([place])    type II   . Hypertension   . Obesity   . Sinus congestion    all the time  . Sleep apnea    cpap off and on     Past Surgical History:  Procedure Laterality Date  . 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 NASAL  SEPTUM SURGERY    . QUADRICEPS TENDON REPAIR  05/13/2012   Procedure: REPAIR QUADRICEP TENDON;  Surgeon: Augustin Schooling, MD;  Location: [facility];  Service: Orthopedics;  Laterality: Right;  . UMBILICAL HERNIA REPAIR N/A 07/09/2017   Procedure: LAPAROSCOPIC UMBILICAL HERNIA WITH MESH;  Surgeon: [name], [name], MD;  Location: [facility];  Service: General;  Laterality: N/A;    Social History   Socioeconomic History  . Marital status: Married    Spouse name: Not on file  . Number of children: Not on file  . Years of education: Not on file  . Highest education level: Not on file  Occupational History  . Not on file  Social Needs  . Financial resource strain: Not on file  . Food insecurity:    Worry: Not on file    Inability: Not on file  . Transportation needs:    Medical: Not on file    Non-medical: Not on file  Tobacco Use  . Smoking status: Never Smoker  . Smokeless tobacco: Never Used  Substance and Sexual Activity  . Alcohol use: Yes    Alcohol/week: 3.0 oz    Types: 5 Cans of beer per week    Comment: Social  . Drug use: No  . Sexual activity: Not  on file  Lifestyle  . Physical activity:    Days per week: Not on file    Minutes per session: Not on file  . Stress: Not on file  Relationships  . Social connections:    Talks on phone: Not on file    Gets together: Not on file    Attends religious service: Not on file    Active member of club or organization: Not on file    Attends meetings of clubs or organizations: Not on file    Relationship status: Not on file  . Intimate partner violence:    Fear of current or ex partner: Not on file    Emotionally abused: Not on file    Physically abused: Not on file    Forced sexual activity: Not on file  Other Topics Concern  . Not on file  Social History Narrative  . Not on file    No Known Allergies  Family History  Problem Relation Age of Onset  . Hypertension Other   . Hypertension Mother   . Cataracts Father         Prior to Admission medications   Medication Sig Start Date End Date Taking? Authorizing Provider  acetaminophen (TYLENOL) 500 MG tablet Take 1,000 mg by mouth every 6 (six) hours as needed (for pain.).   Yes [provider]  amLODipine (NORVASC) 10 MG tablet Take 10 mg by mouth daily.   Yes [provider]  aspirin 81 MG chewable tablet Chew 1 tablet (81 mg total) by mouth daily. 07/12/17  Yes [name], [name], MD  carvedilol (COREG) 12.5 MG tablet Take 1 tablet (12.5 mg total) by mouth 2 (two) times daily. 06/19/17 09/17/17 Yes Skeet Latch, MD  doxazosin (CARDURA) 4 MG tablet Take 4 mg by mouth at bedtime.    Yes [provider]  enoxaparin (LOVENOX) 30 MG/0.3ML injection Inject 0.3 mLs (30 mg total) into the skin daily. 07/10/17  Yes [name], [name], MD  isosorbide-hydrALAZINE (BIDIL) 20-37.5 MG tablet Take 2 tablets by mouth 3 (three) times daily.    Yes [provider]  ondansetron (ZOFRAN ODT) 4 MG disintegrating tablet Take 1 tablet (4 mg total) by mouth every 8 (eight) hours as needed for nausea or vomiting. 07/08/17  Yes [name], [name], MD  oxyCODONE (ROXICODONE) 5 MG/5ML solution Take 5 mLs (5 mg total) by mouth every 6 (six) hours as needed for up to 40 doses for severe pain. 07/11/17  Yes [name], [name], MD  pantoprazole (PROTONIX) 40 MG tablet Take 1 tablet (40 mg total) by mouth daily. 07/08/17  Yes [name], [name], MD  traMADol (ULTRAM) 50 MG tablet Take 1 tablet (50 mg total) by mouth every 8 (eight) hours as needed for up to 1 dose. 07/08/17  Yes [name], [name], MD  metoprolol succinate (TOPROL-XL) 100 MG 24 hr tablet Take 100 mg by mouth daily. 05/14/17   [provider]  NAFTIN 2 % GEL Apply 1 application topically daily as needed. 06/19/17   [provider]    Physical Exam: Vitals:   07/27/17 1626 07/27/17 1630 07/27/17 1700 07/27/17 1730  BP:  133/71 129/80 (!) 144/87  Pulse: 82  81 84 85  Resp: 16 11 18 19   Temp:      TempSrc:      SpO2: 99% 98% 91% 92%  Weight:      Height:         General:  Appears calm and comfortable Eyes:  PERRL, EOMI, normal lids, iris ENT:  grossly normal hearing, lips & tongue, mmm Neck:  no LAD, masses or thyromegaly Cardiovascular:  RRR, no m/r/g. No LE edema.  Respiratory:  CTA bilaterally, no w/r/r. Normal respiratory effort. Abdomen:  soft, ntnd, NABS Skin:  no rash or induration seen on limited exam Musculoskeletal: 2+ edema to both  lower extremity.  The right knee is swollen with decreased range of motion.  He is unable to raise his right leg up. Psychiatric:  grossly normal mood and affect, speech fluent and appropriate, AOx3 Neurologic: CN 2-12 grossly intact, moves all extremities in coordinated fashion, sensation intact  Labs on Admission: I have personally reviewed following labs and imaging studies  CBC: Recent Labs  Lab 07/27/17 1242  WBC 8.3  HGB 9.1*  HCT 28.1*  MCV 86.5  PLT 149   Basic Metabolic Panel: Recent Labs  Lab 07/27/17 1242  NA 140  K 4.1  CL 105  CO2 17*  GLUCOSE 137*  BUN 36*  CREATININE 3.26*  CALCIUM 8.7*   GFR: Estimated Creatinine Clearance: 36.8 mL/min (A) (by C-G formula based on SCr of 3.26 mg/dL (H)). Liver Function Tests: Recent Labs  Lab 07/27/17 1242  AST 11*  ALT 18  ALKPHOS 65  BILITOT 0.9  PROT 6.7  ALBUMIN 3.0*   No results for input(s): LIPASE, AMYLASE in the last 168 hours. No results for input(s): AMMONIA in the last 168 hours. Coagulation Profile: No results for input(s): INR, PROTIME in the last 168 hours. Cardiac Enzymes: Recent Labs  Lab 07/27/17 1242  CKTOTAL 82   BNP (last 3 results) No results for input(s): PROBNP in the last 8760 hours. HbA1C: No results for input(s): HGBA1C in the last 72 hours. CBG: No results for input(s): GLUCAP in the last 168 hours. Lipid Profile: No results for input(s): CHOL, HDL, LDLCALC, TRIG, CHOLHDL,  LDLDIRECT in the last 72 hours. Thyroid Function Tests: No results for input(s): TSH, T4TOTAL, FREET4, T3FREE, THYROIDAB in the last 72 hours. Anemia Panel: No results for input(s): VITAMINB12, FOLATE, FERRITIN, TIBC, IRON, RETICCTPCT in the last 72 hours. Urine analysis:    Component Value Date/Time   COLORURINE YELLOW 07/27/2017 1501   APPEARANCEUR CLEAR 07/27/2017 1501   LABSPEC 1.012 07/27/2017 1501   PHURINE 5.0 07/27/2017 1501   GLUCOSEU NEGATIVE 07/27/2017 1501   HGBUR NEGATIVE 07/27/2017 1501   BILIRUBINUR NEGATIVE 07/27/2017 1501   KETONESUR 20 (A) 07/27/2017 1501   PROTEINUR >=300 (A) 07/27/2017 1501   NITRITE NEGATIVE 07/27/2017 1501   LEUKOCYTESUR NEGATIVE 07/27/2017 1501    Creatinine Clearance: Estimated Creatinine Clearance: 36.8 mL/min (A) (by C-G formula based on SCr of 3.26 mg/dL (H)).  Sepsis Labs: @LABRCNTIP (procalcitonin:4,lacticidven:4) )No results found for this or any previous visit (from the past 240 hour(s)).   Radiological Exams on Admission: Dg Chest 2 View  Result Date: 07/27/2017 CLINICAL DATA:  Bilateral leg swelling over the last 5 days. EXAM: CHEST - 2 VIEW COMPARISON:  01/21/2017 FINDINGS: Borderline cardiomegaly. The lungs are clear. Vascularity within normal limits. No effusions. No significant bone finding. IMPRESSION: No active cardiopulmonary disease. Electronically Signed   By: Nelson Chimes M.D.   On: 07/27/2017 14:47    EKG: Independently reviewed.   Assessment/Plan Active Problems:   * No active hospital problems. *   1] right knee swelling in a patient with a history of right knee arthroplasty many years ago followed at [facility].  Need to rule out septic arthritis versus gout.  Patient came  in with pain and swelling of the right leg right knee and right ankle.  Patient reports he was taking diuretics prior to gastric bypass surgery 07/07/2017.  He denied fever.  His white count is normal.  Discussed with Ortho Dr. Alvan Dame who  is kind enough to see the patient and  tap his knee for diagnostic purposes.  I will order order x-ray of the knee and ankle along with sed rate CRP and uric acid.  I will also give him a dose of Lasix.  I will also put in a PT OT evaluation.  Venous Doppler in the ER negative for DVT.  Patient was taking Lovenox at home after gastric bypass.  2] recent gastric bypass  3] hypertension restart home medications.  4] CKD stage III followed by nephrology.  Creatinine at baseline.    DVT prophylaxis: Lovenox Code Status: Full code Family Communication: No family available Disposition Plan: TBD Consults called: Bobette Mo Dr. Alvan Dame Admission status: Inpatient   Georgette Shell MD [facility]  If 7PM-7AM, please contact night-coverage [email or web address] Password [facility]  07/27/2017, 5:51 PM

## 2017-07-27 NOTE — Progress Notes (Signed)
Preliminary notes by tech--Right lower extremity venous duplex study completed. Negative for DVT.  Prominent lymph node seen on right groin area.   Hongying Jahlen Bollman(RDMS RVT) 07/27/17 3:45 PM

## 2017-07-27 NOTE — ED Provider Notes (Signed)
[facility] DEPT Provider Note   CSN: [id number] Arrival date & time: 07/27/17  1003     History   Chief Complaint Chief Complaint  Patient presents with  . Leg Swelling    HPI [name] is a 52 y.o. male presenting for evaluation of bilateral leg swelling.  Patient states for the past 4-5 days, he has had bilateral leg swelling.  Yesterday, he started to have sharp pain of his right leg.  The pain is of his ankle and knee.  It is constant, worse with ambulation.  He denies fall, trauma, or injury.  He denies pain on the left side, although states that he has swelling.  He is not on Lasix or other fluid pills, states he used to be on them but is no longer.  He had a Roux-en-Y in February, and subsequently went back to the OR for umbilical hernia repair.  He is on daily blood thinners.  He denies fevers, chills, chest pain, shortness breath, cough, nausea, vomiting, abdominal pain, abnormal urination, or abnormal bowel movements.  He denies recent travel.  No history of clot.  No history of cancer or hormone use.  HPI  Past Medical History:  Diagnosis Date  . Anemia    iron infusions in last few months for anemia   . Arthritis    ankles   . Chronic kidney disease    followed by Dr Arty Baumgartner   . Diabetes mellitus without complication ([place])    type II   . Hypertension   . Obesity   . Sinus congestion    all the time  . Sleep apnea    cpap off and on     Patient Active Problem List   Diagnosis Date Noted  . Morbid obesity ([place]) 07/07/2017  . Back pain 03/27/2017  . Hypertension   . Diabetes mellitus without complication ([place])   . Obesity   . Obstructive sleep apnea 11/06/2015  . Nasal turbinate hypertrophy 11/06/2015  . Rhinitis, chronic 11/06/2015    Past Surgical History:  Procedure Laterality Date  . 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 NASAL SEPTUM SURGERY    . QUADRICEPS TENDON REPAIR  05/13/2012   Procedure: REPAIR QUADRICEP TENDON;  Surgeon: Augustin Schooling, MD;  Location: [facility];  Service: Orthopedics;  Laterality: Right;  . UMBILICAL HERNIA REPAIR N/A 07/09/2017   Procedure: LAPAROSCOPIC UMBILICAL HERNIA WITH MESH;  Surgeon: [name], [name], MD;  Location: [facility];  Service: General;  Laterality: N/A;        Home Medications    Prior to Admission medications   Medication Sig Start Date End Date Taking? Authorizing Provider  acetaminophen (TYLENOL) 500 MG tablet Take 1,000 mg by mouth every 6 (six) hours as needed (for pain.).   Yes [provider]  amLODipine (NORVASC) 10 MG tablet Take 10 mg by mouth daily.   Yes [provider]  aspirin 81 MG chewable tablet Chew 1 tablet (81 mg total) by mouth daily. 07/12/17  Yes [name], [name], MD  carvedilol (COREG) 12.5 MG tablet Take 1 tablet (12.5 mg total) by mouth 2 (two) times daily. 06/19/17 09/17/17 Yes Skeet Latch, MD  doxazosin (CARDURA) 4 MG tablet Take 4 mg by mouth at bedtime.    Yes [provider]  enoxaparin (LOVENOX) 30 MG/0.3ML injection Inject  0.3 mLs (30 mg total) into the skin daily. 07/10/17  Yes [name], [name], MD  isosorbide-hydrALAZINE (BIDIL) 20-37.5 MG tablet Take 2 tablets by mouth 3 (three) times daily.    Yes [provider]  ondansetron (ZOFRAN ODT) 4 MG disintegrating tablet Take 1 tablet (4 mg total) by mouth every 8 (eight) hours as needed for nausea or vomiting. 07/08/17  Yes [name], [name], MD  oxyCODONE (ROXICODONE) 5 MG/5ML solution Take 5 mLs (5 mg total) by mouth every 6 (six) hours as needed for up to 40 doses for severe pain. 07/11/17  Yes [name], [name], MD  pantoprazole (PROTONIX) 40 MG tablet Take 1 tablet (40 mg total) by mouth daily. 07/08/17  Yes [name], [name], MD  traMADol (ULTRAM) 50 MG  tablet Take 1 tablet (50 mg total) by mouth every 8 (eight) hours as needed for up to 1 dose. 07/08/17  Yes [name], [name], MD  metoprolol succinate (TOPROL-XL) 100 MG 24 hr tablet Take 100 mg by mouth daily. 05/14/17   [provider]  NAFTIN 2 % GEL Apply 1 application topically daily as needed. 06/19/17   [provider]    Family History Family History  Problem Relation Age of Onset  . Hypertension Other   . Hypertension Mother   . Cataracts Father     Social History Social History   Tobacco Use  . Smoking status: Never Smoker  . Smokeless tobacco: Never Used  Substance Use Topics  . Alcohol use: Yes    Alcohol/week: 3.0 oz    Types: 5 Cans of beer per week    Comment: Social  . Drug use: No     Allergies   Patient has no known allergies.   Review of Systems Review of Systems  Respiratory: Negative for cough and shortness of breath.   Cardiovascular: Positive for leg swelling.  Musculoskeletal: Positive for arthralgias and joint swelling.  Skin: Negative for wound.  Hematological: Bruises/bleeds easily.  All other systems reviewed and are negative.    Physical Exam Updated Vital Signs BP 135/76   Pulse 80   Temp (!) 97.5 F (36.4 C) (Oral)   Resp 16   Ht 5\' 8"  (1.727 m)   Wt (!) 142.9 kg (315 lb)   SpO2 99%   BMI 47.90 kg/m   Physical Exam  Constitutional: He is oriented to person, place, and time. He appears well-developed and well-nourished. No distress.  HENT:  Head: Normocephalic and atraumatic.  Eyes: Pupils are equal, round, and reactive to light. Conjunctivae and EOM are normal.  Neck: Normal range of motion. Neck supple.  Cardiovascular: Normal rate, regular rhythm and intact distal pulses.  Pulmonary/Chest: Effort normal and breath sounds normal. No respiratory distress. He has no wheezes.  Speaking full sentences.  Clear lung sounds in all fields.  Abdominal: Soft. Bowel sounds are normal. He exhibits no distension.  There is no tenderness.  Well-healing surgical sites of the abdomen without surrounding erythema, warmth, or tenderness.  Abdomen is soft without rigidity, guarding, or distention.  Musculoskeletal: He exhibits edema and tenderness.  Swelling of bilateral legs, worse on the right.  Tenderness to palpation of right medial ankle and right knee.  No erythema.  Pedal pulses intact bilaterally.  Sensation intact bilaterally.  Color and warmth equal bilaterally.  No obvious deformity or injury.  Neurological: He is alert and oriented to person, place, and time. No sensory deficit.  Skin: Skin is warm and dry. Capillary refill takes less  than 2 seconds.  Psychiatric: He has a normal mood and affect.  Nursing note and vitals reviewed.    ED Treatments / Results  Labs (all labs ordered are listed, but only abnormal results are displayed) Labs Reviewed  CBC - Abnormal; Notable for the following components:      Result Value   RBC 3.25 (*)    Hemoglobin 9.1 (*)    HCT 28.1 (*)    All other components within normal limits  COMPREHENSIVE METABOLIC PANEL - Abnormal; Notable for the following components:   CO2 17 (*)    Glucose, Bld 137 (*)    BUN 36 (*)    Creatinine, Ser 3.26 (*)    Calcium 8.7 (*)    Albumin 3.0 (*)    AST 11 (*)    GFR calc non Af Amer 20 (*)    GFR calc Af Amer 24 (*)    Anion gap 18 (*)    All other components within normal limits  BLOOD GAS, ARTERIAL - Abnormal; Notable for the following components:   pO2, Arterial 81.9 (*)    Bicarbonate 18.1 (*)    Acid-base deficit 6.2 (*)    All other components within normal limits  BRAIN NATRIURETIC PEPTIDE  LACTIC ACID, PLASMA  LACTIC ACID, PLASMA  URINALYSIS, ROUTINE W REFLEX MICROSCOPIC  CK    EKG EKG Interpretation  Date/Time:  Monday July 27 2017 12:43:19 EDT Ventricular Rate:  86 PR Interval:    QRS Duration: 90 QT Interval:  360 QTC Calculation: 431 R Axis:   30 Text Interpretation:  Sinus rhythm Abnormal  R-wave progression, early transition When compared with ECG of 01/21/2017 No significant change was found Confirmed by Francine Graven [phone]) on 07/27/2017 12:52:42 PM   Radiology Dg Chest 2 View  Result Date: 07/27/2017 CLINICAL DATA:  Bilateral leg swelling over the last 5 days. EXAM: CHEST - 2 VIEW COMPARISON:  01/21/2017 FINDINGS: Borderline cardiomegaly. The lungs are clear. Vascularity within normal limits. No effusions. No significant bone finding. IMPRESSION: No active cardiopulmonary disease. Electronically Signed   By: Nelson Chimes M.D.   On: 07/27/2017 14:47    Procedures Procedures (including critical care time)  Medications Ordered in ED Medications - No data to display   Initial Impression / Assessment and Plan / ED Course  I have reviewed the triage vital signs and the nursing notes.  Pertinent labs & imaging results that were available during my care of the patient were reviewed by me and considered in my medical decision making (see chart for details).     Patient presenting for evaluation of bilateral leg swelling.  Pain of the right ankle and knee.  Physical exam shows patient who is afebrile not tachycardic.  He appears nontoxic.  No chest pain or shortness of breath.  Obvious bilateral lower leg swelling, worse on the right side.  Surgical sites on abdomen are healing well without signs of infection.  Will obtain CMP, BNP, CBC, chest x-ray, EKG, and vasc ultrasound.   BNP reassuring.  Chest x-ray without sign of pleural effusion.  EKG non-concerning.  Vascular ultrasound without evidence for DVT.  Labs concerning, bicarb of 17 and gap of 18.  This is abnormal based on patient's baseline.  Creatinine elevated at 3.26, however this appears to be patient's baseline.  No leukocytosis.  Hemoglobin stable at 9.1.  Will order ABG, lactate, UA, and CK to look for source of acidosis.  Case discussed with attending, Dr. Thurnell Garbe recommends admission for  further  management.  Discussed with hospitalist, Dr. Zigmund Daniel, who agrees to admit the patient.  Final Clinical Impressions(s) / ED Diagnoses   Final diagnoses:  Acidosis  Peripheral edema  Right leg pain    ED Discharge Orders    None       Franchot Heidelberg, PA-C 07/27/17 [place], [place], DO 07/28/17 1308

## 2017-07-27 NOTE — ED Notes (Signed)
Report given to [place], [place].

## 2017-07-27 NOTE — ED Notes (Signed)
Patient is on his cell phone and unable to answer questions. Patient is able to speak in complete sentences.

## 2017-07-28 DIAGNOSIS — M25461 Effusion, right knee: Secondary | ICD-10-CM

## 2017-07-28 LAB — BASIC METABOLIC PANEL
Anion gap: 15 (ref 5–15)
BUN: 35 mg/dL — ABNORMAL HIGH (ref 6–20)
CO2: 20 mmol/L — AB (ref 22–32)
Calcium: 8.8 mg/dL — ABNORMAL LOW (ref 8.9–10.3)
Chloride: 108 mmol/L (ref 101–111)
Creatinine, Ser: 3.41 mg/dL — ABNORMAL HIGH (ref 0.61–1.24)
GFR calc non Af Amer: 19 mL/min — ABNORMAL LOW (ref >60)
GFR, EST AFRICAN AMERICAN: 22 mL/min — AB (ref >60)
Glucose, Bld: 136 mg/dL — ABNORMAL HIGH (ref 65–99)
Potassium: 4.3 mmol/L (ref 3.5–5.1)
SODIUM: 143 mmol/L (ref 135–145)

## 2017-07-28 LAB — CBC
HEMATOCRIT: 30.3 % — AB (ref 39.0–52.0)
Hemoglobin: 9.6 g/dL — ABNORMAL LOW (ref 13.0–17.0)
MCH: 27.7 pg (ref 26.0–34.0)
MCHC: 31.7 g/dL (ref 30.0–36.0)
MCV: 87.6 fL (ref 78.0–100.0)
Platelets: 306 10*3/uL (ref 150–400)
RBC: 3.46 MIL/uL — ABNORMAL LOW (ref 4.22–5.81)
RDW: 15 % (ref 11.5–15.5)
WBC: 9.1 10*3/uL (ref 4.0–10.5)

## 2017-07-28 LAB — SYNOVIAL CELL COUNT + DIFF, W/ CRYSTALS
Crystals, Fluid: NONE SEEN
Monocyte-Macrophage-Synovial Fluid: 4 % — ABNORMAL LOW (ref 50–90)
Neutrophil, Synovial: 96 % — ABNORMAL HIGH (ref 0–25)
WBC, Synovial: 1230 /cu mm — ABNORMAL HIGH (ref 0–200)

## 2017-07-28 LAB — HIV ANTIBODY (ROUTINE TESTING W REFLEX): HIV SCREEN 4TH GENERATION: NONREACTIVE

## 2017-07-28 MED ORDER — LIDOCAINE HCL 1 % IJ SOLN
20.0000 mL | Freq: Once | INTRAMUSCULAR | Status: AC
  Administered 2017-07-28: 20 mL via INTRADERMAL
  Filled 2017-07-28: qty 20

## 2017-07-28 MED ORDER — TRIAMCINOLONE ACETONIDE 40 MG/ML IJ SUSP
80.0000 mg | Freq: Once | INTRAMUSCULAR | Status: DC
  Filled 2017-07-28: qty 2

## 2017-07-28 MED ORDER — LIDOCAINE HCL 2 % IJ SOLN: 10.0000 mL | Freq: Once | INTRAMUSCULAR | Status: DC

## 2017-07-28 MED ORDER — METHYLPREDNISOLONE ACETATE 80 MG/ML IJ SUSP
80.0000 mg | Freq: Once | INTRAMUSCULAR | Status: AC
  Administered 2017-07-28: 80 mg via INTRA_ARTICULAR
  Filled 2017-07-28: qty 1

## 2017-07-28 MED ORDER — LIDOCAINE HCL (PF) 2 % IJ SOLN: 10.0000 mL | Freq: Once | INTRAMUSCULAR | Status: DC

## 2017-07-28 MED ORDER — ACETAMINOPHEN 325 MG PO TABS
650.0000 mg | ORAL_TABLET | Freq: Four times a day (QID) | ORAL | Status: DC | PRN
  Administered 2017-07-28: 650 mg via ORAL
  Administered 2017-07-28: 325 mg via ORAL
  Administered 2017-07-29 – 2017-07-30 (×3): 650 mg via ORAL
  Filled 2017-07-28 (×5): qty 2

## 2017-07-28 MED ORDER — ONDANSETRON HCL 4 MG/2ML IJ SOLN
4.0000 mg | Freq: Four times a day (QID) | INTRAMUSCULAR | Status: DC | PRN
  Administered 2017-07-28: 4 mg via INTRAVENOUS
  Filled 2017-07-28: qty 2

## 2017-07-28 NOTE — Progress Notes (Addendum)
PROGRESS NOTE    [name]  KCL:[id number] DOB: April 26, 1966 DOA: 07/27/2017 PCP: Charolette Forward, MD     Brief Narrative:  [name] is a 52 y.o. male with medical history significant of morbid obesity, CKD, diabetes type 2, hypertension, sleep apnea, anemia status post recent gastric bypass July 07, 2017 admitted with complaints of right leg pain, right ankle pain, right knee pain.  He reports he is unable to walk because of the pain and has increased swelling compared to his normal.  He also reports he was on Lasix prior to the surgery and was taken off Lasix.  Denies history of trauma or gout.  He denies any fever, chills, chest pain, nausea, vomiting, diarrhea, or cough. He was admitted for further treatment of right knee effusion and pain.   Assessment & Plan:   Principal Problem:   Effusion, right knee Active Problems:   Hypertension   Morbid obesity (HCC)   Right knee effusion and pain -Uric acid 12.9, CRP 20, sed rate 128 -Orthopedic surgery consulted for aspiration  -Pain control   Hypertension  -Continue coreg, cardura  CAD -Continue bidil   GERD -Continue PPI   CKD stage III -Baseline Cr 3.5 -Follows with Nephrology -Stable   Morbid obesity -S/p recent gastric bypass -Body mass index is 47.9 kg/m.    DVT prophylaxis: Lovenox Code Status: Full Family Communication: Wife at bedside Disposition Plan: Pending orthopedic consult, PT OT eval, pain control   Consultants:   Orthopedic surgery  Procedures:   None   Antimicrobials:  Anti-infectives (From admission, onward)   None        Subjective: Continues to have significant pain of right knee and right ankle as well as swelling.  He states that he is unable to move his right leg without assistance from his hands.  Denies any fevers at home.  No chest pain, shortness of breath, nausea, vomiting, diarrhea or abdominal pain.  Objective: Vitals:   07/27/17 1800 07/27/17 2158 07/28/17  0200 07/28/17 0435  BP: 140/77 130/69  124/71  Pulse: 80 91 100 93  Resp: 14 20  20   Temp:  99.8 F (37.7 C) 99.6 F (37.6 C) 99.4 F (37.4 C)  TempSrc:  Oral Oral Oral  SpO2: 98% 98% 100% 97%  Weight:      Height:        Intake/Output Summary (Last 24 hours) at 07/28/2017 1346 Last data filed at 07/28/2017 0815 Gross per 24 hour  Intake -  Output 1425 ml  Net -1425 ml   Filed Weights   07/27/17 1018  Weight: (!) 142.9 kg (315 lb)    Examination:  General exam: Appears calm and comfortable  Respiratory system: Clear to auscultation. Respiratory effort normal. Cardiovascular system: S1 & S2 heard, RRR. No JVD, murmurs, rubs, gallops or clicks. No pedal edema. Gastrointestinal system: Abdomen is nondistended, soft and nontender. No organomegaly or masses felt. Normal bowel sounds heard. Central nervous system: Alert and oriented. No focal neurological deficits. Extremities: +Right knee effusion  Skin: No rashes, lesions or ulcers Psychiatry: Judgement and insight appear normal. Mood & affect appropriate.   Data Reviewed: I have personally reviewed following labs and imaging studies  CBC: Recent Labs  Lab 07/27/17 1242 07/27/17 1811 07/28/17 0419  WBC 8.3 7.2 9.1  HGB 9.1* 9.1* 9.6*  HCT 28.1* 27.9* 30.3*  MCV 86.5 86.4 87.6  PLT 310 307 494   Basic Metabolic Panel: Recent Labs  Lab 07/27/17 1242 07/27/17 1811 07/28/17  0419  NA 140  --  143  K 4.1  --  4.3  CL 105  --  108  CO2 17*  --  20*  GLUCOSE 137*  --  136*  BUN 36*  --  35*  CREATININE 3.26* 3.26* 3.41*  CALCIUM 8.7*  --  8.8*   GFR: Estimated Creatinine Clearance: 35.2 mL/min (A) (by C-G formula based on SCr of 3.41 mg/dL (H)). Liver Function Tests: Recent Labs  Lab 07/27/17 1242  AST 11*  ALT 18  ALKPHOS 65  BILITOT 0.9  PROT 6.7  ALBUMIN 3.0*   No results for input(s): LIPASE, AMYLASE in the last 168 hours. No results for input(s): AMMONIA in the last 168 hours. Coagulation  Profile: No results for input(s): INR, PROTIME in the last 168 hours. Cardiac Enzymes: Recent Labs  Lab 07/27/17 1242  CKTOTAL 82   BNP (last 3 results) No results for input(s): PROBNP in the last 8760 hours. HbA1C: No results for input(s): HGBA1C in the last 72 hours. CBG: No results for input(s): GLUCAP in the last 168 hours. Lipid Profile: No results for input(s): CHOL, HDL, LDLCALC, TRIG, CHOLHDL, LDLDIRECT in the last 72 hours. Thyroid Function Tests: No results for input(s): TSH, T4TOTAL, FREET4, T3FREE, THYROIDAB in the last 72 hours. Anemia Panel: No results for input(s): VITAMINB12, FOLATE, FERRITIN, TIBC, IRON, RETICCTPCT in the last 72 hours. Sepsis Labs: Recent Labs  Lab 07/27/17 1501  LATICACIDVEN 0.5    No results found for this or any previous visit (from the past 240 hour(s)).     Radiology Studies: Dg Chest 2 View  Result Date: 07/27/2017 CLINICAL DATA:  Bilateral leg swelling over the last 5 days. EXAM: CHEST - 2 VIEW COMPARISON:  01/21/2017 FINDINGS: Borderline cardiomegaly. The lungs are clear. Vascularity within normal limits. No effusions. No significant bone finding. IMPRESSION: No active cardiopulmonary disease. Electronically Signed   By: Nelson Chimes M.D.   On: 07/27/2017 14:47   Dg Ankle Complete Right  Result Date: 07/27/2017 CLINICAL DATA:  Pain and swelling involving the right ankle. EXAM: RIGHT ANKLE - COMPLETE 3+ VIEW COMPARISON:  None. FINDINGS: No fracture or dislocation. Joint spaces are preserved. Ankle mortise appears preserved though examination is degraded due to obliquity. No definite ankle joint effusion. Note is made of a small os trigonum. Small plantar calcaneal spur. Regional soft tissues appear normal. IMPRESSION: 1. No definite acute findings. 2. Small plantar calcaneal spur. Electronically Signed   By: Sandi Mariscal M.D.   On: 07/27/2017 19:39   Dg Knee Complete 4 Views Right  Result Date: 07/27/2017 CLINICAL DATA:  History of  quadriceps tendon injury now with right knee pain, primarily located above the patella. EXAM: RIGHT KNEE - COMPLETE 4+ VIEW COMPARISON:  05/03/2012 FINDINGS: Small to moderate-sized knee joint effusion. No fracture or dislocation. Joint spaces appear preserved. No evidence of chondrocalcinosis. Regional soft tissues appear normal. IMPRESSION: Small to moderate-sized knee joint effusion without associated fracture or dislocation. Electronically Signed   By: Sandi Mariscal M.D.   On: 07/27/2017 19:41      Scheduled Meds: . carvedilol  12.5 mg Oral BID  . doxazosin  4 mg Oral QHS  . enoxaparin (LOVENOX) injection  30 mg Subcutaneous Q24H  . isosorbide-hydrALAZINE  2 tablet Oral TID  . lidocaine  20 mL Intradermal Once  . lidocaine  10 mL Intradermal Once  . methylPREDNISolone acetate  80 mg Intra-articular Once  . pantoprazole  40 mg Oral Daily  . triamcinolone acetonide  80 mg Intra-articular Once   Continuous Infusions:   LOS: 1 day    Time spent: 30 minutes   Dessa Phi, DO [facility] [email or web address] Password [email or web address] 07/28/2017, 1:46 PM

## 2017-07-28 NOTE — Progress Notes (Signed)
Patient alert and oriented.  In to visit  patient for bariatric needs.  Patient has advanced to Phase III diet (protein).  We discussed menu options and provided patient protein shakes for supplementation.  Contact information provided to patient and wife at bedside.  No further questions at this time.  Will continue to monitor.  Kelby Fam MSN, RN-BC Bariatric Nurse Coordinator Pager # [phone]

## 2017-07-29 DIAGNOSIS — N183 Chronic kidney disease, stage 3 (moderate): Secondary | ICD-10-CM

## 2017-07-29 DIAGNOSIS — E790 Hyperuricemia without signs of inflammatory arthritis and tophaceous disease: Secondary | ICD-10-CM

## 2017-07-29 DIAGNOSIS — M25461 Effusion, right knee: Secondary | ICD-10-CM

## 2017-07-29 DIAGNOSIS — I10 Essential (primary) hypertension: Secondary | ICD-10-CM

## 2017-07-29 LAB — BASIC METABOLIC PANEL
ANION GAP: 15 (ref 5–15)
BUN: 38 mg/dL — ABNORMAL HIGH (ref 6–20)
CALCIUM: 8.4 mg/dL — AB (ref 8.9–10.3)
CHLORIDE: 106 mmol/L (ref 101–111)
CO2: 21 mmol/L — AB (ref 22–32)
Creatinine, Ser: 3.56 mg/dL — ABNORMAL HIGH (ref 0.61–1.24)
GFR calc Af Amer: 21 mL/min — ABNORMAL LOW (ref >60)
GFR calc non Af Amer: 18 mL/min — ABNORMAL LOW (ref >60)
GLUCOSE: 155 mg/dL — AB (ref 65–99)
Potassium: 4.4 mmol/L (ref 3.5–5.1)
Sodium: 142 mmol/L (ref 135–145)

## 2017-07-29 LAB — CBC
HEMATOCRIT: 28.6 % — AB (ref 39.0–52.0)
Hemoglobin: 9.4 g/dL — ABNORMAL LOW (ref 13.0–17.0)
MCH: 28.4 pg (ref 26.0–34.0)
MCHC: 32.9 g/dL (ref 30.0–36.0)
MCV: 86.4 fL (ref 78.0–100.0)
Platelets: 300 10*3/uL (ref 150–400)
RBC: 3.31 MIL/uL — ABNORMAL LOW (ref 4.22–5.81)
RDW: 14.8 % (ref 11.5–15.5)
WBC: 10.3 10*3/uL (ref 4.0–10.5)

## 2017-07-29 MED ORDER — PREDNISONE 20 MG PO TABS
40.0000 mg | ORAL_TABLET | Freq: Two times a day (BID) | ORAL | Status: DC
  Administered 2017-07-29 – 2017-07-30 (×2): 40 mg via ORAL
  Filled 2017-07-29 (×2): qty 2

## 2017-07-29 MED ORDER — ENOXAPARIN SODIUM 40 MG/0.4ML ~~LOC~~ SOLN
40.0000 mg | SUBCUTANEOUS | Status: DC
  Administered 2017-07-30: 40 mg via SUBCUTANEOUS
  Filled 2017-07-29: qty 0.4

## 2017-07-29 NOTE — Progress Notes (Signed)
No needs identified for bariatric nurse coordinator.  Patient stated had [place] yesterday and eggs this am.  Tolerated those well.  Protein shakes provided to patient to supplement.

## 2017-07-29 NOTE — Consult Note (Signed)
Reason for Consult: Right knee and right ankle pain Referring Physician: Terrence Dupont, MD  [name] is an 52 y.o. male.  HPI: 52 year old male presented to the emergency room with a recent onset of increasing pain in his right knee and ankle limiting his ability to bear weight.  He is recently status post laparoscopic bypass surgery for weight loss.  No other reported complications regarding his bypass surgery. He has had a history of right quadricep tendon repair with regular follow-up with Dr. [name] interval episodes of right knee and ankle pain.  He reports the knee that in the office he has had his right knee injected with Dr. Veverly Fells in his right ankle addressed by a podiatrist in town.  He has had no fevers chills or night sweats.  No interval trauma  Past Medical History:  Diagnosis Date  . Anemia    iron infusions in last few months for anemia   . Arthritis    ankles   . Chronic kidney disease    followed by Dr Arty Baumgartner   . Diabetes mellitus without complication ([place])    type II   . Hypertension   . Obesity   . Sinus congestion    all the time  . Sleep apnea    cpap off and on     Past Surgical History:  Procedure Laterality Date  . 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 NASAL SEPTUM SURGERY    . QUADRICEPS TENDON REPAIR  05/13/2012   Procedure: REPAIR QUADRICEP TENDON;  Surgeon: Augustin Schooling, MD;  Location: [place];  Service: Orthopedics;  Laterality: Right;  . UMBILICAL HERNIA REPAIR N/A 07/09/2017   Procedure: LAPAROSCOPIC UMBILICAL HERNIA WITH MESH;  Surgeon: [name], [name], MD;  Location: [facility];  Service: General;  Laterality: N/A;    Family History  Problem Relation Age of Onset  . Hypertension Other   . Hypertension Mother   . Cataracts Father     Social History:  reports that he has  never smoked. He has never used smokeless tobacco. He reports that he drinks about 3.0 oz of alcohol per week. He reports that he does not use drugs.  Allergies: No Known Allergies  Medications:  I have reviewed the patient's current medications. Scheduled: . carvedilol  12.5 mg Oral BID  . doxazosin  4 mg Oral QHS  . enoxaparin (LOVENOX) injection  30 mg Subcutaneous Q24H  . isosorbide-hydrALAZINE  2 tablet Oral TID  . lidocaine  10 mL Intradermal Once  . pantoprazole  40 mg Oral Daily  . triamcinolone acetonide  80 mg Intra-articular Once    Results for orders placed or performed during the hospital encounter of 07/27/17 (from the past 24 hour(s))  Synovial cell count + diff, w/ crystals     Status: Abnormal   Collection Time: 07/28/17  5:06 PM  Result Value Ref Range   Color, Synovial STRAW (A) YELLOW   Appearance-Synovial CLOUDY (A) CLEAR   Crystals, Fluid NO CRYSTALS SEEN    WBC, Synovial 1,230 (H) 0 - 200 /cu mm   Neutrophil, Synovial 96 (H) 0 - 25 %   Monocyte-Macrophage-Synovial Fluid 4 (L) 50 - 90 %  CBC     Status: Abnormal   Collection Time: 07/29/17  3:48 AM  Result Value Ref Range   WBC 10.3 4.0 - 10.5  K/uL   RBC 3.31 (L) 4.22 - 5.81 MIL/uL   Hemoglobin 9.4 (L) 13.0 - 17.0 g/dL   HCT 28.6 (L) 39.0 - 52.0 %   MCV 86.4 78.0 - 100.0 fL   MCH 28.4 26.0 - 34.0 pg   MCHC 32.9 30.0 - 36.0 g/dL   RDW 14.8 11.5 - 15.5 %   Platelets 300 150 - 400 K/uL  Basic metabolic panel     Status: Abnormal   Collection Time: 07/29/17  3:48 AM  Result Value Ref Range   Sodium 142 135 - 145 mmol/L   Potassium 4.4 3.5 - 5.1 mmol/L   Chloride 106 101 - 111 mmol/L   CO2 21 (L) 22 - 32 mmol/L   Glucose, Bld 155 (H) 65 - 99 mg/dL   BUN 38 (H) 6 - 20 mg/dL   Creatinine, Ser 3.56 (H) 0.61 - 1.24 mg/dL   Calcium 8.4 (L) 8.9 - 10.3 mg/dL   GFR calc non Af Amer 18 (L) >60 mL/min   GFR calc Af Amer 21 (L) >60 mL/min   Anion gap 15 5 - 15    X-ray: CLINICAL DATA:  Pain and swelling  involving the right ankle.  EXAM: RIGHT ANKLE - COMPLETE 3+ VIEW  COMPARISON:  None.  FINDINGS: No fracture or dislocation. Joint spaces are preserved. Ankle mortise appears preserved though examination is degraded due to obliquity.  No definite ankle joint effusion. Note is made of a small os trigonum. Small plantar calcaneal spur. Regional soft tissues appear normal.  IMPRESSION: 1. No definite acute findings. 2. Small plantar calcaneal spur.   Electronically Signed   By: Sandi Mariscal M.D.  CLINICAL DATA:  History of quadriceps tendon injury now with right knee pain, primarily located above the patella.  EXAM: RIGHT KNEE - COMPLETE 4+ VIEW  COMPARISON:  05/03/2012  FINDINGS: Small to moderate-sized knee joint effusion. No fracture or dislocation. Joint spaces appear preserved. No evidence of chondrocalcinosis. Regional soft tissues appear normal.  IMPRESSION: Small to moderate-sized knee joint effusion without associated fracture or dislocation.   Electronically Signed   By: Sandi Mariscal M.D.  ROS  As noted in his presenting history As noted regarding his laparoscopic bypass surgery Otherwise as noted in his presenting history and physical   Blood pressure 138/78, pulse 80, temperature 97.8 F (36.6 C), temperature source Oral, resp. rate 14, height 5\' 8"  (1.727 m), weight (!) 143.5 kg (316 lb 5.8 oz), SpO2 95 %.  Physical Exam  Pleasant 52 year old obese male awake alert and oriented. Seen and evaluated in his hospital room. His right knee is very tender to palpation with small to moderate effusion noted He has a healed incision over the right knee with no erythema or drainage He has pain with motion. His right ankle is tender to palpation as well. There is no erythema around his right knee or ankle.  There is mild warmth to palpation. He is otherwise neurovascular intact I cannot examine his right hip due to pain with motion of his right  knee. His left lower extremity is otherwise normal for compared purposes.  Assessment/Plan: Assessment: Acute onset right knee and ankle pain with no clinical diagnosis of gout at this point  Plan: In the office today upon review of his history and presentation I reviewed his radiographic findings as well.  Lab work has been ordered per our request indicating an elevated CRP at 20 and sedimentation rate of 128.  Uric acid level was also ordered and  noted to be elevated at 12.9.  At the bedside under a Betadine prep I aspirated 30 cc of slightly cloudy fluid no acute concerns for infection.  I then injected his knee with 80 mg of Depo-Medrol and lidocaine.  He did not wish to have his ankle injected at this time.  Results of the aspiration at the time of this charting indicated a synovial white blood cell count of only 1200.  No crystals were seen  At this point it would be my recommendation for routine follow-up with Dr. Veverly Fells on an as-needed basis for further follow-up.  He can follow-up with his podiatrist regarding his right ankle pain.  Due to his recent bypass surgery he will be prohibited from using any kind of anti-inflammatories.  Further medical management of his elevated uric acid and concern for gout despite no crystals visible in his aspiration may be recommended with follow-up with primary care.  He stated to me that he only sees a cardiologist who acts as his primary care physician.  Further follow-up with a rheumatologist may be recommended.  Otherwise activity ad lib. versus physical therapy assessment for mobility needs.  Mauri Pole 07/29/2017, 6:54 AM

## 2017-07-29 NOTE — Progress Notes (Signed)
52 yo male s/p gastric bypass complicated with acute umbilical hernia required repair POD 2 now presented with knee effusion s/p aspiration concerning for gout. He is tolerating his appropriate diet well and reaching his goals on fluid intake. Incisions well healed -I will see him in clinic this week -due to his earlier pedal edema, he can be restarted on his lasix at the discretion of the internists on the case -agree with avoiding NSAIDs given new gastrojejunal anastomosis

## 2017-07-29 NOTE — Evaluation (Signed)
Physical Therapy Evaluation Patient Details Name: [name] MRN: [id number] DOB: 1965-05-08 Today's Date: 07/29/2017   History of Present Illness  52 year old African-American male with a past medical history of morbid obesity, chronic kidney disease, diabetes mellitus type 2, hypertension, sleep apnea, recent gastric bypass presented with complains of right knee pain and ankle pain.  Seen by orthopaedics and had R knee aspirated and injected 07/29/17  Clinical Impression  Pt admitted with above diagnosis. Pt currently with functional limitations due to the deficits listed below (see PT Problem List). Pt will benefit from skilled PT to increase their independence and safety with mobility to allow discharge to the venue listed below.  Pt reports he is tolerating R LE movement and transfers better then yesterday.  Pt assisted up to recliner with RW (states he used crutches this morning and had more difficulty due to pain) and reports easier mobility this afternoon compared to this morning.  Pt anticipates d/c home tomorrow.  Recommended RW for home use (pt has been using crutches).  Pt mostly min/guard for transfers, only limited by pain.  Pt encouraged to ambulate into bathroom with nursing (RW left in room for pt use) for remainder of acute stay.     Follow Up Recommendations No PT follow up;Supervision for mobility/OOB    Equipment Recommendations  Rolling walker with 5" wheels    Recommendations for Other Services       Precautions / Restrictions Precautions Precautions: None Restrictions Weight Bearing Restrictions: No      Mobility  Bed Mobility Overal bed mobility: Needs Assistance Bed Mobility: Supine to Sit     Supine to sit: Supervision;HOB elevated        Transfers Overall transfer level: Needs assistance Equipment used: Rolling walker (2 wheeled) Transfers: Sit to/from [facility] Sit to Stand: Min guard Stand pivot transfers: Min guard        General transfer comment: verbal cues for UE and LE positioning, pt maintained touch down weight bearing due to pain, felt unable to tolerate ambulating howeve reports transfers better then this morning  Ambulation/Gait                Stairs            Wheelchair Mobility    Modified Rankin (Stroke Patients Only)       Balance                                             Pertinent Vitals/Pain Pain Assessment: 0-10 Pain Score: 5  Pain Location: knee with WBing Pain Descriptors / Indicators: Sore Pain Intervention(s): Limited activity within patient's tolerance;Repositioned;Monitored during session;Premedicated before session    Home Living Family/patient expects to be discharged to:: Private residence Living Arrangements: Spouse/significant other Available Help at Discharge: Family;Available PRN/intermittently Type of Home: House Home Access: Stairs to enter Entrance Stairs-Rails: Right;Left;Can reach both Entrance Stairs-Number of Steps: 12 Home Layout: One level Home Equipment: Crutches      Prior Function Level of Independence: Independent               Hand Dominance        Extremity/Trunk Assessment        Lower Extremity Assessment Lower Extremity Assessment: RLE deficits/detail RLE Deficits / Details: able to perform ankle and knee AROM however slow and effortful due to pain  Communication   Communication: No difficulties  Cognition Arousal/Alertness: Awake/alert Behavior During Therapy: WFL for tasks assessed/performed Overall Cognitive Status: Within Functional Limits for tasks assessed                                        General Comments      Exercises     Assessment/Plan    PT Assessment Patient needs continued PT services  PT Problem List Decreased strength;Decreased mobility;Decreased knowledge of use of DME;Pain       PT Treatment Interventions DME  instruction;Therapeutic activities;Gait training;Therapeutic exercise;Patient/family education;Functional mobility training;Stair training    PT Goals (Current goals can be found in the Care Plan section)  Acute Rehab PT Goals PT Goal Formulation: With patient Time For Goal Achievement: 08/05/17 Potential to Achieve Goals: Good    Frequency Min 3X/week   Barriers to discharge        Co-evaluation               AM-PAC PT "6 Clicks" Daily Activity  Outcome Measure Difficulty turning over in bed (including adjusting bedclothes, sheets and blankets)?: A Little Difficulty moving from lying on back to sitting on the side of the bed? : A Little Difficulty sitting down on and standing up from a chair with arms (e.g., wheelchair, bedside commode, etc,.)?: A Lot Help needed moving to and from a bed to chair (including a wheelchair)?: A Little Help needed walking in hospital room?: A Lot Help needed climbing 3-5 steps with a railing? : A Lot 6 Click Score: 15    End of Session   Activity Tolerance: Patient tolerated treatment well Patient left: in chair;with call bell/phone within reach   PT Visit Diagnosis: Difficulty in walking, not elsewhere classified (R26.2)    Time: 2563-8937 PT Time Calculation (min) (ACUTE ONLY): 14 min   Charges:   PT Evaluation $PT Eval Low Complexity: 1 Low     PT G CodesCarmelia Bake, PT, DPT 07/29/2017 Pager: [phone]  York Ram E 07/29/2017, 3:56 PM

## 2017-07-29 NOTE — Progress Notes (Signed)
[facility] PROGRESS NOTE  [name] BLT:[id number] DOB: Mar 19, 1966 DOA: 07/27/2017  PCP: Charolette Forward, MD  Brief History/Interval Summary: 52 year old African-American male with a past medical history of morbid obesity, chronic kidney disease, diabetes mellitus type 2, hypertension, sleep apnea, recent gastric bypass presented with complains of right knee pain and ankle pain.  Patient was hospitalized for further management.  Reason for Visit: Right knee effusion  Consultants: Orthopedics  Procedures: Arthrocentesis  Antibiotics: None  Subjective/Interval History: Patient complains of pain in the right knee and right ankle.  He has been unable to bear weight on that side.  Denies any shortness of breath or chest pain.  No nausea or vomiting.  ROS: No headaches  Objective:  Vital Signs  Vitals:   07/28/17 1446 07/28/17 1710 07/28/17 2011 07/29/17 0417  BP: (!) 111/55  137/69 138/78  Pulse: 98  91 80  Resp: 20  16 14   Temp: (!) 102.2 F (39 C) 99 F (37.2 C) 99.8 F (37.7 C) 97.8 F (36.6 C)  TempSrc: Oral  Oral Oral  SpO2: 97%  98% 95%  Weight:   (!) 142.9 kg (315 lb) (!) 143.5 kg (316 lb 5.8 oz)  Height:        Intake/Output Summary (Last 24 hours) at 07/29/2017 1256 Last data filed at 07/29/2017 0418 Gross per 24 hour  Intake 240 ml  Output 750 ml  Net -510 ml   Filed Weights   07/27/17 1018 07/28/17 2011 07/29/17 0417  Weight: (!) 142.9 kg (315 lb) (!) 142.9 kg (315 lb) (!) 143.5 kg (316 lb 5.8 oz)    General appearance: alert, cooperative, appears stated age and no distress Head: Normocephalic, without obvious abnormality, atraumatic Resp: clear to auscultation bilaterally Cardio: regular rate and rhythm, S1, S2 normal, no murmur, click, rub or gallop GI: soft, non-tender; bowel sounds normal; no masses,  no organomegaly Extremities: Right knee noted to be swollen compared to the left.  Most of this is chronic per patient ever since his  surgery to that knee many years ago.  Restricted range of motion of the right knee is noted.  Able to move his right ankle better.  Not much warmth over the right knee compared to the left. Pulses: 2+ and symmetric Skin: Skin color, texture, turgor normal. No rashes or lesions Neurologic: No obvious focal neurological deficits  Lab Results:  Data Reviewed: I have personally reviewed following labs and imaging studies  CBC: Recent Labs  Lab 07/27/17 1242 07/27/17 1811 07/28/17 0419 07/29/17 0348  WBC 8.3 7.2 9.1 10.3  HGB 9.1* 9.1* 9.6* 9.4*  HCT 28.1* 27.9* 30.3* 28.6*  MCV 86.5 86.4 87.6 86.4  PLT 310 307 306 007    Basic Metabolic Panel: Recent Labs  Lab 07/27/17 1242 07/27/17 1811 07/28/17 0419 07/29/17 0348  NA 140  --  143 142  K 4.1  --  4.3 4.4  CL 105  --  108 106  CO2 17*  --  20* 21*  GLUCOSE 137*  --  136* 155*  BUN 36*  --  35* 38*  CREATININE 3.26* 3.26* 3.41* 3.56*  CALCIUM 8.7*  --  8.8* 8.4*    GFR: Estimated Creatinine Clearance: 33.8 mL/min (A) (by C-G formula based on SCr of 3.56 mg/dL (H)).  Liver Function Tests: Recent Labs  Lab 07/27/17 1242  AST 11*  ALT 18  ALKPHOS 65  BILITOT 0.9  PROT 6.7  ALBUMIN 3.0*    Cardiac Enzymes: Recent Labs  Lab 07/27/17 1242  CKTOTAL 82     Recent Results (from the past 240 hour(s))  Culture, blood (routine x 2)     Status: None (Preliminary result)   Collection Time: 07/28/17  3:40 PM  Result Value Ref Range Status   Specimen Description   Final    BLOOD RIGHT HAND Performed at [facility] [address], [place], [place] [address]    Special Requests   Final    BOTTLES DRAWN AEROBIC AND ANAEROBIC Blood Culture adequate volume Performed at [facility] [address], [place], [place] [address]    Culture   Final    NO GROWTH < 24 HOURS Performed at [facility] [address] [place], [place] [address]    Report Status PENDING   Incomplete  Culture, blood (routine x 2)     Status: None (Preliminary result)   Collection Time: 07/28/17  3:47 PM  Result Value Ref Range Status   Specimen Description   Final    BLOOD LEFT ARM Performed at [facility] [address], [place], [place] [address]    Special Requests   Final    BOTTLES DRAWN AEROBIC AND ANAEROBIC Blood Culture adequate volume Performed at [facility] [address], [place], [place] [address]    Culture   Final    NO GROWTH < 24 HOURS Performed at [facility] [address], [place], [place] [address]    Report Status PENDING  Incomplete      Radiology Studies: Dg Chest 2 View  Result Date: 07/27/2017 CLINICAL DATA:  Bilateral leg swelling over the last 5 days. EXAM: CHEST - 2 VIEW COMPARISON:  01/21/2017 FINDINGS: Borderline cardiomegaly. The lungs are clear. Vascularity within normal limits. No effusions. No significant bone finding. IMPRESSION: No active cardiopulmonary disease. Electronically Signed   By: Nelson Chimes M.D.   On: 07/27/2017 14:47   Dg Ankle Complete Right  Result Date: 07/27/2017 CLINICAL DATA:  Pain and swelling involving the right ankle. EXAM: RIGHT ANKLE - COMPLETE 3+ VIEW COMPARISON:  None. FINDINGS: No fracture or dislocation. Joint spaces are preserved. Ankle mortise appears preserved though examination is degraded due to obliquity. No definite ankle joint effusion. Note is made of a small os trigonum. Small plantar calcaneal spur. Regional soft tissues appear normal. IMPRESSION: 1. No definite acute findings. 2. Small plantar calcaneal spur. Electronically Signed   By: Sandi Mariscal M.D.   On: 07/27/2017 19:39   Dg Knee Complete 4 Views Right  Result Date: 07/27/2017 CLINICAL DATA:  History of quadriceps tendon injury now with right knee pain, primarily located above the patella. EXAM: RIGHT KNEE - COMPLETE 4+ VIEW COMPARISON:  05/03/2012 FINDINGS: Small to moderate-sized knee joint  effusion. No fracture or dislocation. Joint spaces appear preserved. No evidence of chondrocalcinosis. Regional soft tissues appear normal. IMPRESSION: Small to moderate-sized knee joint effusion without associated fracture or dislocation. Electronically Signed   By: Sandi Mariscal M.D.   On: 07/27/2017 19:41     Medications:  Scheduled: . carvedilol  12.5 mg Oral BID  . doxazosin  4 mg Oral QHS  . enoxaparin (LOVENOX) injection  30 mg Subcutaneous Q24H  . isosorbide-hydrALAZINE  2 tablet Oral TID  . lidocaine  10 mL Intradermal Once  . pantoprazole  40 mg Oral Daily  . predniSONE  40 mg Oral BID WC  . triamcinolone acetonide  80 mg Intra-articular Once   Continuous:  OUZ:[id number], ondansetron (ZOFRAN) IV, oxyCODONE, traMADol  Assessment/Plan:  Principal Problem:   Effusion, right knee Active Problems:   Hypertension   Morbid obesity ([place])    Right knee joint effusion with pain and limited mobility Uric acid level noted to be 12.9.  Patient underwent arthrocentesis which did not show any crystals.  However no concern for infection noted at either.  Etiology is likely multifactorial.  Elevated uric acid level likely contributing as well.  Steroids were injected into the joint.  Continue systemic steroids.  Cannot use NSAIDs due to recent gastric bypass surgery.  PT and OT evaluation.  Hyperuricemia Will need definitive management as an outpatient.  Will defer to PCP.  Essential hypertension Continue home medications.  Monitor blood pressures closely.  History of coronary artery disease Stable.  Continue home medications.  History of GERD Continue PPI  Morbid obesity status post recent gastric bypass Stable. Body mass index is 48.1 kg/m.  Chronic kidney disease stage III Creatinine appears to be close to baseline.  He is followed by nephrology as an outpatient.  Normocytic anemia Likely anemia of chronic kidney disease.  No overt bleeding.  Hemoglobin is  stable.  DVT Prophylaxis: Lovenox    Code Status: Full code Family Communication: Discussed with the patient and his wife Disposition Plan: Management as outlined above.  Await PT and OT evaluation.  Anticipate discharge in the next 24-48 hours.    LOS: 2 days   [facility] Pager [phone] 07/29/2017, 12:56 PM  If 7PM-7AM, please contact night-coverage at [email or web address], password [facility]

## 2017-07-30 MED ORDER — OXYCODONE HCL 5 MG/5ML PO SOLN: 5.0000 mg | Freq: Four times a day (QID) | ORAL | 0 refills | Status: AC | PRN

## 2017-07-30 MED ORDER — PREDNISONE 20 MG PO TABS: ORAL_TABLET | ORAL | 0 refills | Status: DC

## 2017-07-30 NOTE — Discharge Summary (Signed)
[facility]  Physician Discharge Summary   Patient ID: [name] MRN: [id number] DOB/AGE: August 19, 1965 52 y.o.  Admit date: 07/27/2017 Discharge date: 07/30/2017  PCP: Charolette Forward, MD  DISCHARGE DIAGNOSES:  Principal Problem:   Effusion, right knee Active Problems:   Hypertension   Morbid obesity ([place])   RECOMMENDATIONS FOR OUTPATIENT FOLLOW UP: 1. Follow-up with primary care provider. 2. Patient did discuss with her surgeon and nephrology regarding resumption of diuretics   DISCHARGE CONDITION: fair  Diet recommendation: As before  [facility] Weights   07/28/17 2011 07/29/17 0417 07/30/17 0411  Weight: (!) 142.9 kg (315 lb) (!) 143.5 kg (316 lb 5.8 oz) (!) 144.1 kg (317 lb 10.9 oz)    INITIAL HISTORY: 52 year old African-American male with a past medical history of morbid obesity, chronic kidney disease, diabetes mellitus type 2, hypertension, sleep apnea, recent gastric bypass presented with complains of right knee pain and ankle pain.  Patient was hospitalized for further management.  Consultations:  Orthopedic  Procedures:  Right knee arthrocentesis   HOSPITAL COURSE:   Right knee joint effusion with pain and limited mobility/possible acute gout Uric acid level noted to be 12.9.    Patient was seen by orthopedics.  Patient underwent arthrocentesis which surprisingly did not show any crystals.  However no concern for infection noted at either.  Etiology is likely multifactorial.  Elevated uric acid level likely contributing as well.  Steroids were injected into the joint.  Continue systemic steroids.  Cannot use NSAIDs due to recent gastric bypass surgery.  Seen by physical therapy and was able to ambulate with a walker.  Patient will be discharged on tapering doses of steroids.  Will need to follow-up with his primary care provider to consider definitive management of gout.  Hyperuricemia Will need definitive management as an outpatient.  Could consider  referral to rheumatology. Will defer to PCP.  Essential hypertension Continue home medications  History of coronary artery disease Stable.  Continue home medications.  History of GERD Continue PPI  Morbid obesity status post recent gastric bypass Stable. Body mass index is 48.1 kg/m.  Patient has an appointment with the surgeon this afternoon.  Chronic kidney disease stage III Creatinine appears to be close to baseline.  He is followed by nephrology as an outpatient.  Patient was taken off of his diuretics by his surgeon.  He was asked to discuss with them as to when he can resume this.  Normocytic anemia Likely anemia of chronic kidney disease.  No overt bleeding.  Hemoglobin is stable.  Overall stable.  Okay for discharge home today.     PERTINENT LABS:  The results of significant diagnostics from this hospitalization (including imaging, microbiology, ancillary and laboratory) are listed below for reference.    Microbiology: Recent Results (from the past 240 hour(s))  Culture, blood (routine x 2)     Status: None (Preliminary result)   Collection Time: 07/28/17  3:40 PM  Result Value Ref Range Status   Specimen Description   Final    BLOOD RIGHT HAND Performed at [facility] [address], [place], [place] [address]    Special Requests   Final    BOTTLES DRAWN AEROBIC AND ANAEROBIC Blood Culture adequate volume Performed at [facility] [address] [place], [place] [address]    Culture   Final    NO GROWTH 2 DAYS Performed at [facility] [address] [place],  [address]    Report Status  PENDING  Incomplete  Culture, blood (routine x 2)     Status: None (Preliminary result)   Collection Time: 07/28/17  3:47 PM  Result Value Ref Range Status   Specimen Description   Final    BLOOD LEFT ARM Performed at [facility] [address] [place], [place] [address]    Special  Requests   Final    BOTTLES DRAWN AEROBIC AND ANAEROBIC Blood Culture adequate volume Performed at [facility] [address] [place], [place] [address]    Culture   Final    NO GROWTH 2 DAYS Performed at [facility] [address], [place], [place] [address]    Report Status PENDING  Incomplete     Labs: Basic Metabolic Panel: Recent Labs  Lab 07/27/17 1242 07/27/17 1811 07/28/17 0419 07/29/17 0348  NA 140  --  143 142  K 4.1  --  4.3 4.4  CL 105  --  108 106  CO2 17*  --  20* 21*  GLUCOSE 137*  --  136* 155*  BUN 36*  --  35* 38*  CREATININE 3.26* 3.26* 3.41* 3.56*  CALCIUM 8.7*  --  8.8* 8.4*   Liver Function Tests: Recent Labs  Lab 07/27/17 1242  AST 11*  ALT 18  ALKPHOS 65  BILITOT 0.9  PROT 6.7  ALBUMIN 3.0*   CBC: Recent Labs  Lab 07/27/17 1242 07/27/17 1811 07/28/17 0419 07/29/17 0348  WBC 8.3 7.2 9.1 10.3  HGB 9.1* 9.1* 9.6* 9.4*  HCT 28.1* 27.9* 30.3* 28.6*  MCV 86.5 86.4 87.6 86.4  PLT 310 307 306 300   Cardiac Enzymes: Recent Labs  Lab 07/27/17 1242  CKTOTAL 82   BNP: BNP (last 3 results) Recent Labs    07/27/17 1242  BNP 31.2     IMAGING STUDIES  Dg Chest 2 View  Result Date: 07/27/2017 CLINICAL DATA:  Bilateral leg swelling over the last 5 days. EXAM: CHEST - 2 VIEW COMPARISON:  01/21/2017 FINDINGS: Borderline cardiomegaly. The lungs are clear. Vascularity within normal limits. No effusions. No significant bone finding. IMPRESSION: No active cardiopulmonary disease. Electronically Signed   By: Nelson Chimes M.D.   On: 07/27/2017 14:47   Dg Ankle Complete Right  Result Date: 07/27/2017 CLINICAL DATA:  Pain and swelling involving the right ankle. EXAM: RIGHT ANKLE - COMPLETE 3+ VIEW COMPARISON:  None. FINDINGS: No fracture or dislocation. Joint spaces are preserved. Ankle mortise appears preserved though examination is degraded due to obliquity. No definite ankle joint effusion. Note is made of a small  os trigonum. Small plantar calcaneal spur. Regional soft tissues appear normal. IMPRESSION: 1. No definite acute findings. 2. Small plantar calcaneal spur. Electronically Signed   By: Sandi Mariscal M.D.   On: 07/27/2017 19:39   Dg Knee Complete 4 Views Right  Result Date: 07/27/2017 CLINICAL DATA:  History of quadriceps tendon injury now with right knee pain, primarily located above the patella. EXAM: RIGHT KNEE - COMPLETE 4+ VIEW COMPARISON:  05/03/2012 FINDINGS: Small to moderate-sized knee joint effusion. No fracture or dislocation. Joint spaces appear preserved. No evidence of chondrocalcinosis. Regional soft tissues appear normal. IMPRESSION: Small to moderate-sized knee joint effusion without associated fracture or dislocation. Electronically Signed   By: Sandi Mariscal M.D.   On: 07/27/2017 19:41    DISCHARGE EXAMINATION: Vitals:   07/29/17 1548 07/29/17 2029 07/30/17 0411 07/30/17 0941  BP: 111/61 (!) 141/72 126/76 116/69  Pulse: 89 82 75 84  Resp: 18 16 14 18   Temp: 97.8 F (36.6 C) 97.9 F (36.6 C) 97.7 F (36.5 C) 97.6 F (36.4 C)  TempSrc: Oral Oral Oral Oral  SpO2: 99% 99% 99% 100%  Weight:   (!) 144.1 kg (317 lb 10.9 oz)   Height:       General appearance: alert, cooperative, appears stated age and no distress Resp: clear to auscultation bilaterally Cardio: regular rate and rhythm, S1, S2 normal, no murmur, click, rub or gallop GI: soft, non-tender; bowel sounds normal; no masses,  no organomegaly Right knee noted to be swollen compared to left.  Limited range of motion.  Tender with passive movement.  DISPOSITION: Home  Discharge Instructions    Call MD for:  difficulty breathing, headache or visual disturbances   Complete by:  As directed    Call MD for:  extreme fatigue   Complete by:  As directed    Call MD for:  persistant dizziness or light-headedness   Complete by:  As directed    Call MD for:  persistant nausea and vomiting   Complete by:  As directed    Call  MD for:  redness, tenderness, or signs of infection (pain, swelling, redness, odor or green/yellow discharge around incision site)   Complete by:  As directed    Call MD for:  severe uncontrolled pain   Complete by:  As directed    Call MD for:  temperature >100.4   Complete by:  As directed    Discharge instructions   Complete by:  As directed    Please ask your surgeon or nephrologist when to resume the lasix. Please discuss with your PCP regarding the high uric acid levels.  You were cared for by a hospitalist during your hospital stay. If you have any questions about your discharge medications or the care you received while you were in the hospital after you are discharged, you can call the unit and asked to speak with the hospitalist on call if the hospitalist that took care of you is not available. Once you are discharged, your primary care physician will handle any further medical issues. Please note that NO REFILLS for any discharge medications will be authorized once you are discharged, as it is imperative that you return to your primary care physician (or establish a relationship with a primary care physician if you do not have one) for your aftercare needs so that they can reassess your need for medications and monitor your lab values. If you do not have a primary care physician, you can call [phone] for a physician referral.   Increase activity slowly   Complete by:  As directed         Allergies as of 07/30/2017   No Known Allergies     Medication List    STOP taking these medications   metoprolol succinate 100 MG 24 hr tablet Commonly known as:  TOPROL-XL   traMADol 50 MG tablet Commonly known as:  ULTRAM     TAKE these medications   acetaminophen 500 MG tablet Commonly known as:  TYLENOL Take 1,000 mg by mouth every 6 (six) hours as needed (for pain.).   amLODipine 10 MG tablet Commonly known as:  NORVASC Take 10 mg by mouth daily.   aspirin 81 MG chewable  tablet Chew 1 tablet (81 mg total) by mouth daily.   carvedilol 12.5 MG tablet Commonly known as:  COREG Take 1 tablet (12.5 mg total) by mouth 2 (two) times  daily.   doxazosin 4 MG tablet Commonly known as:  CARDURA Take 4 mg by mouth at bedtime.   enoxaparin 30 MG/0.3ML injection Commonly known as:  LOVENOX Inject 0.3 mLs (30 mg total) into the skin daily.   isosorbide-hydrALAZINE 20-37.5 MG tablet Commonly known as:  BIDIL Take 2 tablets by mouth 3 (three) times daily.   NAFTIN 2 % Gel Generic drug:  Naftifine HCl Apply 1 application topically daily as needed.   ondansetron 4 MG disintegrating tablet Commonly known as:  ZOFRAN ODT Take 1 tablet (4 mg total) by mouth every 8 (eight) hours as needed for nausea or vomiting.   oxyCODONE 5 MG/5ML solution Commonly known as:  ROXICODONE Take 5 mLs (5 mg total) by mouth every 6 (six) hours as needed for up to 5 days for severe pain.   pantoprazole 40 MG tablet Commonly known as:  PROTONIX Take 1 tablet (40 mg total) by mouth daily.   predniSONE 20 MG tablet Commonly known as:  DELTASONE Take 2 tablets twice daily for 4 days, then take 2 tablets once daily for 4 days, then take 1 tablet once daily for 4 days.            Durable Medical Equipment  (From admission, onward)        Start     Ordered   07/30/17 1023  For home use only DME Walker rolling  Once    Question:  Patient needs a walker to treat with the following condition  Answer:  Arthritis   07/30/17 1022       Follow-up Information    Charolette Forward, MD. Schedule an appointment as soon as possible for a visit in 1 week(s).   Specialty:  Cardiology Contact information: [facility] [address] [place] [address] [phone]           TOTAL DISCHARGE TIME: 35 minutes  Bonnielee Haff  [facility] Pager [phone]  07/30/2017, 2:16 PM

## 2017-07-30 NOTE — Progress Notes (Signed)
Patient discharged to home with family, discharge instructions reviewed with patient who verbalized understanding. New RX's given to patient. 

## 2017-07-30 NOTE — Progress Notes (Signed)
[facility] rep alerted of order for RW. Marney Doctor RN,BSN,NCM [phone]

## 2017-07-30 NOTE — Discharge Instructions (Signed)

## 2017-08-02 LAB — CULTURE, BLOOD (ROUTINE X 2)
Culture: NO GROWTH
Culture: NO GROWTH
SPECIAL REQUESTS: ADEQUATE
Special Requests: ADEQUATE

## 2017-09-01 ENCOUNTER — Ambulatory Visit: Payer: Self-pay | Admitting: Registered"

## 2017-09-08 ENCOUNTER — Encounter: Payer: Self-pay | Admitting: Registered"

## 2017-09-08 ENCOUNTER — Encounter: Payer: BLUE CROSS/BLUE SHIELD | Attending: General Surgery | Admitting: Registered"

## 2017-09-08 DIAGNOSIS — Z713 Dietary counseling and surveillance: Secondary | ICD-10-CM | POA: Diagnosis not present

## 2017-09-08 DIAGNOSIS — Z79899 Other long term (current) drug therapy: Secondary | ICD-10-CM | POA: Insufficient documentation

## 2017-09-08 DIAGNOSIS — I10 Essential (primary) hypertension: Secondary | ICD-10-CM | POA: Insufficient documentation

## 2017-09-08 DIAGNOSIS — M1990 Unspecified osteoarthritis, unspecified site: Secondary | ICD-10-CM | POA: Insufficient documentation

## 2017-09-08 DIAGNOSIS — Z6841 Body Mass Index (BMI) 40.0 and over, adult: Secondary | ICD-10-CM | POA: Diagnosis not present

## 2017-09-08 DIAGNOSIS — E119 Type 2 diabetes mellitus without complications: Secondary | ICD-10-CM | POA: Insufficient documentation

## 2017-09-08 DIAGNOSIS — Z7984 Long term (current) use of oral hypoglycemic drugs: Secondary | ICD-10-CM | POA: Insufficient documentation

## 2017-09-08 DIAGNOSIS — G4730 Sleep apnea, unspecified: Secondary | ICD-10-CM | POA: Diagnosis not present

## 2017-09-08 DIAGNOSIS — Z8249 Family history of ischemic heart disease and other diseases of the circulatory system: Secondary | ICD-10-CM | POA: Insufficient documentation

## 2017-09-08 NOTE — Patient Instructions (Addendum)
-   Track your food intake aiming for at least 80 grams of protein a day.   - Increase protein intake. See snacks handout.   - Continue to aim to not sip with meals.   - Aim to not drink 15 minutes before eating, not while eating, and waiting 30 minutes after eating to drink.

## 2017-09-08 NOTE — Progress Notes (Signed)
Follow-up visit:  8 Weeks Post-Operative RYGB Surgery  Medical Nutrition Therapy:  Appt start time: 9:00 end time:  9:38.  Primary concerns today: Post-operative Bariatric Surgery Nutrition Management.  Non scale victories: increased mobility, reduced diabetes medications   Surgery date: 07/07/2017 Surgery type: RYGB Start weight at [facility]: 338.2 Weight today: 289.7 lbs Weight change: N/A; pt declined weight at previous visit (07/21/2017) Total weight lost: 48.5 lbs Weight loss goal: increase energy, improve knee and ankle pain, reduce medications   TANITA  BODY COMP RESULTS  07/21/2017 09/08/2017   BMI (kg/m^2) Pt declined Pt used regular scale   Fat Mass (lbs)     Fat Free Mass (lbs)     Total Body Water (lbs)     Pt states he is trying to increase strength in his knee. Pt states he believes he has had issues with gout. Pt states he is not taking gout medication. Pt states his insurance has recently changed and plans to return to doctor at next flare-up to possibly get medication. Pt states he does not like greek yogurt. Pt is poor historian of dietary recall says he tracked his food for the first month averaging 60-70 grams of protein, dietary recall states he gets ~54 grams of protein, and towards the end of the appt he says that he averages 80 grams of protein. Pt states he restricts his food intake because he does not want to eat too much. Pt states he sips with his meals to help the food go down and it helps him with eating. Pt is participating in BELT 3x/week + walking and taking bariatric multivitamin and calcium as prescribed. Pt states he has some low blood sugar episodes due to recently taking diabetes medication again at times.   Preferred Learning Style:   No preference indicated   Learning Readiness:   Ready  Change in progress  24-hr recall: Poor historian B (AM): egg (6g) + 1/2 bacon/sausage (6g) or protein shake (30g) Snk (AM): 1/2 sausage   L (PM): 2-3 oz  chicken (14-21g), cabbage/greens Snk (PM): none  D (PM): 2-3 oz chicken (14-21g), cabbage/greens or protein shake (30g) Snk (PM): none  Fluid intake: water (1.5-2 times of 64 ounce container); 64+ ounces Estimated total protein intake: 50-70 grams  Medications: See list; takes glimipiride Supplementation: [employer] chewable + 3 calcium   CBG monitoring: yes, daily Average CBG per patient:  FBS 135-46 Last patient reported A1c: N/A  Using straws: sometimes Drinking while eating: yes, sips Having you been chewing well: yes Chewing/swallowing difficulties: no Changes in vision: no Changes to mood/headaches: no Hair loss/Changes to skin/Changes to nails: no, no, no Any difficulty focusing or concentrating: no Sweating: no Dizziness/Lightheaded: sometimes due to low blood sugar Palpitations: no  Carbonated beverages: no N/V/D/C/GAS: no, no, no, sometimes-Miralax, sometimes Abdominal Pain: no Dumping syndrome: no Last Lap-Band fill: N/A  Recent physical activity:  BELT 60 min, 3x/week + walking   Progress Towards Goal(s):  In progress.  Handouts given during visit include:  Snack ideas for bariatric patients   Nutritional Diagnosis:  NI-5.7.1 Inadequate protein intake As related to bariatric surgery post-op recommendations.  As evidenced by pt report of less than 80 grams of protein a day.    Intervention:  Nutrition education and counseling. Pt was educated and counseled on the importance of meeting daily protein goals, tracking his protein intake and blood sugars, not drinking/sipping with meals, learning to listen to his body when eating, how to stop eating when satisfied,  and establishing consistency in his daily routine for the overall benefit of his health. Pt was also reminded of the reasons we do not recommend using straws after bariatric surgery.  Goals: - Track your food intake aiming for at least 80 grams of protein a day.  - Increase protein intake. See snacks  handout.  - Continue to aim to not sip with meals.  - Aim to not drink 15 minutes before eating, not while eating, and waiting 30 minutes after eating to drink.   Teaching Method Utilized:  Visual Auditory Hands on  Barriers to learning/adherence to lifestyle change: inconsistency with tracking and preconceived ideas about eating and drinking  Demonstrated degree of understanding via:  Teach Back   Monitoring/Evaluation:  Dietary intake, exercise, lap band fills, and body weight. Follow up in 2 weeks for 10 week post-op visit.

## 2017-09-22 ENCOUNTER — Encounter: Payer: BLUE CROSS/BLUE SHIELD | Admitting: Registered"

## 2017-09-22 ENCOUNTER — Encounter: Payer: Self-pay | Admitting: Registered"

## 2017-09-22 DIAGNOSIS — E119 Type 2 diabetes mellitus without complications: Secondary | ICD-10-CM

## 2017-09-22 NOTE — Progress Notes (Signed)
Follow-up visit:  10 Weeks Post-Operative RYGB Surgery  Medical Nutrition Therapy:  Appt start time: 11:45 end time: 12:10.  Primary concerns today: Post-operative Bariatric Surgery Nutrition Management.  Non scale victories: increased mobility, reduced diabetes medications   Surgery date: 07/07/2017 Surgery type: RYGB Start weight at [facility]: 338.2 Weight today: 283.2 lbs Weight change: 6.5 lbs loss from 289.7 (09/08/2017) Total weight lost: 55 lbs Weight loss goal: increase energy, improve knee and ankle pain, reduce medications   TANITA  BODY COMP RESULTS  07/21/2017 09/08/2017 09/22/2017   BMI (kg/m^2) Pt declined Pt used regular scale Pt used regular scale   Fat Mass (lbs)      Fat Free Mass (lbs)      Total Body Water (lbs)      Pt states he has been tracking protein intake since previous visit; arrives with food log. Pt states he eats 3-5 oz meat at a time. Pt states he weighs food on scale at home. Pt states hie BS numbers have improved since previous visit as well. Pt states he is physically limited sometimes due to ankle. Pt states he is still working on not sipping with meals, states it is hard but still working.   Pt states he is trying to increase strength in his knee. Pt states he believes he has had issues with gout. Pt states he is not taking gout medication. Pt states his insurance has recently changed and plans to return to doctor at next flare-up to possibly get medication. Pt states he does not like greek yogurt. Pt is participating in BELT 3x/week + walking and taking bariatric multivitamin and calcium as prescribed.   Preferred Learning Style:   No preference indicated   Learning Readiness:   Ready  Change in progress  24-hr recall: Poor historian B (AM): egg (6g) + 1/2 bacon/sausage (6g) or protein shake (30g) Snk (AM): 1/2 sausage   L (PM): 3 oz chicken (21g), cabbage/greens or shake (30g) Snk (PM): none  D (PM): 3-5 oz hibachi chicken, shrimp, steak  (21-35g), cabbage/greens or protein shake (30g) Snk (PM): none  Fluid intake: water (1.5-2 times of 64 ounce container); 64+ ounces Estimated total protein intake: 80-90 grams  Medications: See list; takes glimipiride Supplementation: [employer] chewable + 3 calcium   CBG monitoring: yes, daily Average CBG per patient:  FBS 130-150 Last patient reported A1c: N/A  Using straws: sometimes Drinking while eating: yes, sips Having you been chewing well: yes Chewing/swallowing difficulties: no Changes in vision: no Changes to mood/headaches: no Hair loss/Changes to skin/Changes to nails: no, no, no Any difficulty focusing or concentrating: no Sweating: no Dizziness/Lightheaded: sometimes due to low blood sugar Palpitations: no  Carbonated beverages: no N/V/D/C/GAS: no, no, no, sometimes-Miralax, sometimes Abdominal Pain: no Dumping syndrome: no Last Lap-Band fill: N/A  Recent physical activity:  BELT 60 min, 3x/week + walking   Progress Towards Goal(s):  In progress.  Handouts given during visit include:  Phase IV: High protein + NS vegetables    Nutritional Diagnosis:  [place]-3.3 Overweight/obesity related to past poor dietary habits and physical inactivity as evidenced by patient w/ recent RYGB surgery following dietary guidelines for continued weight loss.     Intervention:  Nutrition education and counseling. Pt was educated and counseled on how to introduce non-starchy vegetables, eating his protein first, and not drinking with meals. Pt was encouraged to keep up the great work with meeting fluid goals and participating in [place].  Goals:  Follow Phase 3B: High Protein +  Non-Starchy Vegetables  Eat 3-6 small meals/snacks, every 3-5 hrs  Increase lean protein foods to meet 80g goal  Increase fluid intake to 64oz +  Avoid drinking 15 minutes before, during and 30 minutes after eating  Aim for >30 min of physical activity daily  - Try having a snack before bed.    Teaching Method Utilized:  Visual Auditory Hands on  Barriers to learning/adherence to lifestyle change: inconsistency with tracking and preconceived ideas about eating and drinking  Demonstrated degree of understanding via:  Teach Back   Monitoring/Evaluation:  Dietary intake, exercise, lap band fills, and body weight. Follow up in 4 months for 6 month post-op visit.

## 2017-09-22 NOTE — Patient Instructions (Addendum)
Goals:  Follow Phase 3B: High Protein + Non-Starchy Vegetables  Eat 3-6 small meals/snacks, every 3-5 hrs  Increase lean protein foods to meet 80g goal  Increase fluid intake to 64oz +  Avoid drinking 15 minutes before, during and 30 minutes after eating  Aim for >30 min of physical activity daily  - Try having a snack before bed.

## 2017-10-21 ENCOUNTER — Other Ambulatory Visit: Payer: Self-pay | Admitting: Cardiovascular Disease

## 2017-11-11 ENCOUNTER — Other Ambulatory Visit (HOSPITAL_COMMUNITY): Payer: Self-pay

## 2017-11-12 ENCOUNTER — Ambulatory Visit (HOSPITAL_COMMUNITY)
Admission: RE | Admit: 2017-11-12 | Discharge: 2017-11-12 | Disposition: A | Discharge status: Home / Self Care | Payer: BLUE CROSS/BLUE SHIELD | Source: Ambulatory Visit | Attending: Nephrology | Admitting: Nephrology

## 2017-11-12 DIAGNOSIS — N189 Chronic kidney disease, unspecified: Secondary | ICD-10-CM | POA: Insufficient documentation

## 2017-11-12 DIAGNOSIS — D631 Anemia in chronic kidney disease: Secondary | ICD-10-CM | POA: Insufficient documentation

## 2017-11-12 MED ORDER — SODIUM CHLORIDE 0.9 % IV SOLN
510.0000 mg | INTRAVENOUS | Status: DC
  Administered 2017-11-12: 510 mg via INTRAVENOUS
  Filled 2017-11-12: qty 17

## 2017-11-17 ENCOUNTER — Encounter (HOSPITAL_COMMUNITY): Payer: Self-pay

## 2017-11-17 ENCOUNTER — Telehealth: Payer: Self-pay | Admitting: Cardiovascular Disease

## 2017-11-17 NOTE — Telephone Encounter (Signed)
Received records from [place] on 11/17/17, Appt 12/29/17 @ 3:00PM. NV

## 2017-11-18 ENCOUNTER — Other Ambulatory Visit (HOSPITAL_COMMUNITY): Payer: Self-pay | Admitting: *Deleted

## 2017-11-19 ENCOUNTER — Encounter (HOSPITAL_COMMUNITY)
Admission: RE | Admit: 2017-11-19 | Discharge: 2017-11-19 | Disposition: A | Discharge status: Home / Self Care | Payer: BLUE CROSS/BLUE SHIELD | Source: Ambulatory Visit | Attending: Nephrology | Admitting: Nephrology

## 2017-11-19 DIAGNOSIS — N184 Chronic kidney disease, stage 4 (severe): Secondary | ICD-10-CM | POA: Insufficient documentation

## 2017-11-19 DIAGNOSIS — D631 Anemia in chronic kidney disease: Secondary | ICD-10-CM | POA: Diagnosis present

## 2017-11-19 LAB — RENAL FUNCTION PANEL
Albumin: 3.3 g/dL — ABNORMAL LOW (ref 3.5–5.0)
Anion gap: 11 (ref 5–15)
BUN: 52 mg/dL — AB (ref 6–20)
CALCIUM: 8.8 mg/dL — AB (ref 8.9–10.3)
CHLORIDE: 105 mmol/L (ref 98–111)
CO2: 26 mmol/L (ref 22–32)
Creatinine, Ser: 4.01 mg/dL — ABNORMAL HIGH (ref 0.61–1.24)
GFR calc Af Amer: 18 mL/min — ABNORMAL LOW (ref >60)
GFR calc non Af Amer: 16 mL/min — ABNORMAL LOW (ref >60)
Glucose, Bld: 157 mg/dL — ABNORMAL HIGH (ref 70–99)
Phosphorus: 5 mg/dL — ABNORMAL HIGH (ref 2.5–4.6)
Potassium: 3.7 mmol/L (ref 3.5–5.1)
Sodium: 142 mmol/L (ref 135–145)

## 2017-11-19 LAB — POCT HEMOGLOBIN-HEMACUE: Hemoglobin: 9.6 g/dL — ABNORMAL LOW (ref 13.0–17.0)

## 2017-11-19 MED ORDER — SODIUM CHLORIDE 0.9 % IV SOLN
510.0000 mg | Freq: Once | INTRAVENOUS | Status: AC
  Administered 2017-11-19: 510 mg via INTRAVENOUS
  Filled 2017-11-19: qty 17

## 2017-11-20 LAB — PTH, INTACT AND CALCIUM
CALCIUM TOTAL (PTH): 8.8 mg/dL (ref 8.7–10.2)
PTH: 108 pg/mL — ABNORMAL HIGH (ref 15–65)

## 2017-12-29 ENCOUNTER — Ambulatory Visit: Payer: Self-pay | Admitting: Cardiovascular Disease

## 2018-01-01 ENCOUNTER — Telehealth: Payer: Self-pay | Admitting: Cardiovascular Disease

## 2018-01-05 ENCOUNTER — Encounter: Payer: BLUE CROSS/BLUE SHIELD | Attending: General Surgery | Admitting: Registered"

## 2018-01-05 ENCOUNTER — Encounter: Payer: Self-pay | Admitting: Registered"

## 2018-01-05 ENCOUNTER — Ambulatory Visit: Payer: Self-pay

## 2018-01-05 DIAGNOSIS — Z9884 Bariatric surgery status: Secondary | ICD-10-CM | POA: Diagnosis not present

## 2018-01-05 DIAGNOSIS — Z713 Dietary counseling and surveillance: Secondary | ICD-10-CM | POA: Diagnosis not present

## 2018-01-05 DIAGNOSIS — E119 Type 2 diabetes mellitus without complications: Secondary | ICD-10-CM

## 2018-01-05 NOTE — Progress Notes (Signed)
Follow-up visit:  6 Months Post-Operative RYGB Surgery  Medical Nutrition Therapy:  Appt start time: 11:23 end time: 12:15  Primary concerns today: Post-operative Bariatric Surgery Nutrition Management.  Non scale victories: increased mobility, reduced diabetes medications, more energy, wearing smaller clothes, more room in booths when eating out  Surgery date: 07/07/2017 Surgery type: RYGB Start weight at [facility]: 338.2 Weight today: 258.0 lbs Weight change: 25.2 lbs loss from 283.2 (09/22/2017) Total weight lost: 80.2 lbs Weight loss goal: increase energy, improve knee and ankle pain, reduce medications   TANITA  BODY COMP RESULTS  07/21/2017 09/08/2017 09/22/2017 01/05/2018   BMI (kg/m^2) Pt declined Pt used regular scale Pt used regular scale Pt used regular scale   Fat Mass (lbs)       Fat Free Mass (lbs)       Total Body Water (lbs)        Pt arrives with wife and states he has bowel movements every 3-4 days. Pt states he enjoys cauliflower mashed potatoes and rice. Pt states he wants to try cherries. Pt states he is currently having a gout flare-up limiting physical activity.   Pt states he does not like greek yogurt. Pt is participating in BELT 3x/week + walking and taking bariatric multivitamin and calcium as prescribed.    Preferred Learning Style:   No preference indicated   Learning Readiness:   Ready  Change in progress  24-hr recall: Poor historian B (AM): egg (6g) + bacon/sausage (14g) or sometimes protein shake (30g) Snk (AM): none L (2 PM): 4 oz chicken/steak/pork (28g) + cabbage/greens  Snk (PM): sometimes pork rinds or nuts (7-8g)  D (8 PM): 4 oz chicken/steak/pork (28g) + cabbage/greens  Snk (PM): none  Fluid intake: water sometimes with flavor pack (1.5-2 times of 64 ounce container); 100+ ounces Estimated total protein intake: 80+ grams  Medications: See list; takes glimipiride Supplementation: [employer] capsules + 3 calcium   CBG  monitoring: yes, daily Average CBG per patient:  FBS 107-120 Last patient reported A1c: 9.6 (11/19/2017)  Using straws: no Drinking while eating: no Having you been chewing well: yes Chewing/swallowing difficulties: no Changes in vision: no Changes to mood/headaches: no Hair loss/Changes to skin/Changes to nails: no, no, no Any difficulty focusing or concentrating: no Sweating: no Dizziness/Lightheaded: no Palpitations: no  Carbonated beverages: no N/V/D/C/GAS: no, no, no, sometimes-Miralax, no Abdominal Pain: no Dumping syndrome: no Last Lap-Band fill: N/A  Recent physical activity:  BELT 60 min, 3x/week + biking 1-2 times/week  Progress Towards Goal(s):  In progress.  Handouts given during visit include:  Phase IV: High protein + All vegetables    Nutritional Diagnosis:  [place]-3.3 Overweight/obesity related to past poor dietary habits and physical inactivity as evidenced by patient w/ recent RYGB surgery following dietary guidelines for continued weight loss.     Intervention:  Nutrition education and counseling. Pt was educated and counseled on how to introduce starchy vegetables, eating his protein first, and not drinking with meals. Pt was encouraged to keep up the great work.  Goals:  Follow Phase V: High Protein + All Vegetables  Eat 3-6 small meals/snacks, every 3-5 hrs  Increase lean protein foods to meet 80g goal  Increase fluid intake to 64oz +  Avoid drinking 15 minutes before, during and 30 minutes after eating  Aim for >30 min of physical activity daily  Increase fiber intake with nuts, beans, and vegetables  Teaching Method Utilized:  Visual Auditory Hands on  Barriers to learning/adherence to lifestyle  change: none identified  Demonstrated degree of understanding via:  Teach Back   Monitoring/Evaluation:  Dietary intake, exercise, lap band fills, and body weight. Follow up in 3 months for 9 month post-op visit.

## 2018-01-05 NOTE — Patient Instructions (Addendum)
Goals:  Follow Phase V: High Protein + All Vegetables  Eat 3-6 small meals/snacks, every 3-5 hrs  Increase lean protein foods to meet 80g goal  Increase fluid intake to 64oz +  Avoid drinking 15 minutes before, during and 30 minutes after eating  Aim for >30 min of physical activity daily  Increase fiber intake with nuts, beans, and vegetables

## 2018-01-08 ENCOUNTER — Encounter: Payer: Self-pay | Admitting: Cardiovascular Disease

## 2018-01-08 ENCOUNTER — Ambulatory Visit (INDEPENDENT_AMBULATORY_CARE_PROVIDER_SITE_OTHER): Payer: BLUE CROSS/BLUE SHIELD | Admitting: Cardiovascular Disease

## 2018-01-08 VITALS — BP 122/72 | HR 59 | Ht 68.0 in | Wt 255.2 lb

## 2018-01-08 DIAGNOSIS — Z5181 Encounter for therapeutic drug level monitoring: Secondary | ICD-10-CM

## 2018-01-08 DIAGNOSIS — I10 Essential (primary) hypertension: Secondary | ICD-10-CM

## 2018-01-08 DIAGNOSIS — M79605 Pain in left leg: Secondary | ICD-10-CM

## 2018-01-08 DIAGNOSIS — M79604 Pain in right leg: Secondary | ICD-10-CM | POA: Diagnosis not present

## 2018-01-08 MED ORDER — HYDROCHLOROTHIAZIDE 25 MG PO TABS: 25.0000 mg | ORAL_TABLET | Freq: Every day | ORAL | 0 refills | Status: DC

## 2018-01-08 NOTE — Progress Notes (Signed)
Cardiology Office Note   Date:  01/16/2018   ID:  [name], DOB 04-Oct-1965, MRN [id number]  PCP:  Charolette Forward, MD  Cardiologist:   Skeet Latch, MD   No chief complaint on file.   History of Present Illness: [name] is a 52 y.o. male with hypertension, hyperlipidemia, diabetes, CKD IV, OSA on CPAP, and morbid obesity here for follow-up.  He was initially seen 01/2017 for pre-surgical risk assessment prior to gastric bypass surgery.  At the time he did get much exercise due to chronic knee pain.  He was referred for [facility] 03/2017 that revealed LVEF 48% and no ischemia.  At his last appointment his BP was elevated so metoprolol was switched to carvedilol.  Since then he has been feeling well.  His BP has been well-controlled ranging from 426-834 systolic.  His surgery went well and he has long 80lb in 9 months.  He works out at [employer] three days per week.  He likes to ride the exercise bike.  He has some exertional dyspnea and lower extremity edema but no orthopnea or PND.  He takes lasix every other day, which seems to help.  He limits the salt in his diet.  His main complaint is numbness in bilateral feet.  Prior to the surgery his diabetes was poorly controlled for many years.     Past Medical History:  Diagnosis Date  . Anemia    iron infusions in last few months for anemia   . Arthritis    ankles   . Chronic kidney disease    followed by Dr Arty Baumgartner   . Diabetes mellitus without complication ([place])    type II   . Hypertension   . Obesity   . Sinus congestion    all the time  . Sleep apnea    cpap off and on     Past Surgical History:  Procedure Laterality Date  . 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 NASAL SEPTUM SURGERY    . QUADRICEPS TENDON REPAIR  05/13/2012   Procedure:  REPAIR QUADRICEP TENDON;  Surgeon: Augustin Schooling, MD;  Location: [facility];  Service: Orthopedics;  Laterality: Right;  . UMBILICAL HERNIA REPAIR N/A 07/09/2017   Procedure: LAPAROSCOPIC UMBILICAL HERNIA WITH MESH;  Surgeon: [name], [name], MD;  Location: [facility];  Service: General;  Laterality: N/A;     Current Outpatient Medications  Medication Sig Dispense Refill  . acetaminophen (TYLENOL) 500 MG tablet Take 1,000 mg by mouth every 6 (six) hours as needed (for pain.).    Marland Kitchen ALLOPURINOL PO Take by mouth.    Marland Kitchen aspirin 81 MG chewable tablet Chew 1 tablet (81 mg total) by mouth daily. 1 tablet   . carvedilol (COREG) 12.5 MG tablet TAKE 1 TABLET (12.5 MG TOTAL) BY MOUTH 2 (TWO) TIMES DAILY. 180 tablet 1  . doxazosin (CARDURA) 4 MG tablet Take 4 mg by mouth at bedtime.     Marland Kitchen VIMOVO 500-20 MG TBEC Take 1 tablet by mouth 2 (two) times daily.  5  . amLODipine (NORVASC) 5 MG tablet Take 5 mg by mouth daily.    . furosemide (LASIX) 20 MG tablet Take 20 mg by mouth.     No current facility-administered medications for this visit.     Allergies:   Patient has no known  allergies.    Social History:  The patient  reports that he has never smoked. He has never used smokeless tobacco. He reports that he drinks about 5.0 standard drinks of alcohol per week. He reports that he does not use drugs.   Family History:  The patient's family history includes Cataracts in his father; Hypertension in his mother and other.    ROS:  Please see the history of present illness.   Otherwise, review of systems are positive for none.   All other systems are reviewed and negative.    PHYSICAL EXAM: VS:  BP 122/72   Pulse (!) 59   Ht 5\' 8"  (1.727 m)   Wt 255 lb 3.2 oz (115.8 kg)   BMI 38.80 kg/m  , BMI Body mass index is 38.8 kg/m. GENERAL:  Well appearing HEENT: Pupils equal round and reactive, fundi not visualized, oral mucosa unremarkable NECK:  No jugular venous distention, waveform within normal  limits, carotid upstroke brisk and symmetric, no bruits, no thyromegaly LYMPHATICS:  No cervical adenopathy LUNGS:  Clear to auscultation bilaterally HEART:  RRR.  PMI not displaced or sustained,S1 and S2 within normal limits, no S3, no S4, no clicks, no rubs, no murmurs ABD:  Flat, positive bowel sounds normal in frequency in pitch, no bruits, no rebound, no guarding, no midline pulsatile mass, no hepatomegaly, no splenomegaly EXT:  1+ DP/PT bilaterally.  2+ non-pitting edema, no cyanosis no clubbing SKIN:  No rashes no nodules NEURO:  Cranial nerves II through XII grossly intact, motor grossly intact throughout PSYCH:  Cognitively intact, oriented to person place and time   EKG:  EKG is ordered today. The ekg ordered today demonstrates sinus bradycardia.  Rate 49 bpm.    Echo 04/23/17: Study Conclusions  - Left ventricle: The cavity size was normal. There was mild   concentric hypertrophy. Systolic function was normal. The   estimated ejection fraction was in the range of 55% to 60%. Wall   motion was normal; there were no regional wall motion   abnormalities. Features are consistent with a pseudonormal left   ventricular filling pattern, with concomitant abnormal relaxation   and increased filling pressure (grade 2 diastolic dysfunction). - Mitral valve: There was mild regurgitation. - Left atrium: The atrium was mildly dilated.   Lexiscan Myoview 01/2017:  Normal perfusion.  Nuclear stress EF: 48%. Visual estimate, LVEF appears greater Recomm echo to confirm LVEF  This is a low risk study.   Recent Labs: 07/27/2017: ALT 18; B Natriuretic Peptide 31.2 07/29/2017: Platelets 300 11/19/2017: BUN 52; Creatinine, Ser 4.01; Hemoglobin 9.6; Potassium 3.7; Sodium 142    Lipid Panel No results found for: CHOL, TRIG, HDL, CHOLHDL, VLDL, LDLCALC, LDLDIRECT   09/19/2017: Hemoglobin A1c 6.5%  11/19/2017: Potassium 3.7, creatinine 4.01 Hemoglobin 6.9, platelets 238  Wt Readings  from Last 3 Encounters:  01/08/18 255 lb 3.2 oz (115.8 kg)  01/05/18 258 lb (117 kg)  11/19/17 265 lb (120.2 kg)      ASSESSMENT AND PLAN:  # Hypertension: BP is well controlled but he has LE edema.  I suspect amlodipine is contributing.  He doesn't appear to be volume overloaded.  We will reduce amlodipine to 5mg  and increase lasix to 40mg .  Continue carvedilol and doxazosin.    Addendum:  Patient called to report that his edema had improved and he thought it was due to a gout flare.  Continue amlodipine 10mg  and lasix 20mg .  # Leg numbness:  Peripheral pulses are diminished.  Check ABIs.    Current medicines are reviewed at length with the patient today.  The patient does not have concerns regarding medicines.  The following changes have been made:  no change  Labs/ tests ordered today include:   Orders Placed This Encounter  Procedures  . Basic metabolic panel     Disposition:   FU with Jesse Nosbisch C. Oval Linsey, MD, [facility] in 1-2 months.     Signed, Danyeal Akens C. Oval Linsey, MD, [facility]  01/16/2018 5:46 PM    [facility]

## 2018-01-08 NOTE — Patient Instructions (Signed)
Medication Instructions:  STOP FUROSEMIDE   STOP AMLODIPINE  START HYDROCHLOROTHIAZIDE 25 MG DAILY   Labwork: BMET IN 1 WEEK  Testing/Procedures: Your physician has requested that you have an ankle brachial index (ABI). During this test an ultrasound and blood pressure cuff are used to evaluate the arteries that supply the arms and legs with blood. Allow thirty minutes for this exam. There are no restrictions or special instructions.  Your physician has requested that you have a lower or upper extremity arterial duplex. This test is an ultrasound of the arteries in the legs or arms. It looks at arterial blood flow in the legs and arms. Allow one hour for Lower and Upper Arterial scans. There are no restrictions or special instructions  Follow-Up: Your physician recommends that you schedule a follow-up appointment in: 1-2 MONTHS   Ankle-Brachial Index Test The ankle-brachial index (ABI) test is used to find peripheral vascular disease (PVD). PVD is also known as peripheral arterial disease (PAD). PVD is the blocking or hardening of the arteries anywhere within the circulatory system beyond the heart. PVD is caused by cholesterol deposits in your blood vessels (atherosclerosis). These deposits cause arteries to narrow. The delivery of oxygen to your tissues is impaired as a result. This can cause muscle pain and fatigue. This is called claudication. PVD means there may also be buildup of cholesterol in your:  Heart. This increases the risk of heart attacks.  Brain. This increases the risk of strokes.  The ankle-brachial index test measures the blood flow in your arms and legs. This test also determines if blood vessels in your leg are narrowed by cholesterol deposits. There are additional causes of a reduced ankle-brachial index, such as inflammation of vessels or a clot in the vessels. However, these are much less common than narrowing due to cholesterol deposits. What is being tested? The  test is done while you are lying down and resting. Measurements are taken of the systolic pressure:  In your arm (brachial).  In your ankle at several points along your leg.  Systolic pressure is the pressure inside your arteries when your heart pumps. The measurements are taken several times on both sides. Then, the highest systolic pressure of the ankle is divided by the highest brachial systolic pressure. The result is the ankle-brachial pressure ratio, or ABI. Sometimes this test is repeated after you have exercised on a treadmill for five minutes. You may have leg pain during the exercise portion of the test if you suffer from PAD. If the index number drops after exercise, this may show that PAD is present. A normal ABI ratio is between 0.9 and 1.4. A value below 0.9 is considered abnormal. This information is not intended to replace advice given to you by your health care provider. Make sure you discuss any questions you have with your health care provider. Document Released: 04/18/2004 Document Revised: 09/20/2015 Document Reviewed: 11/18/2013 Elsevier Interactive Patient Education  [employer].

## 2018-01-11 ENCOUNTER — Telehealth: Payer: Self-pay | Admitting: Registered"

## 2018-01-11 ENCOUNTER — Telehealth: Payer: Self-pay | Admitting: *Deleted

## 2018-01-11 NOTE — Telephone Encounter (Signed)
RD returned pt's phone call and unable to leave voicemail message.

## 2018-01-11 NOTE — Telephone Encounter (Addendum)
Left message with wife to have patient call back tomorrow.   ----- Message from Skeet Latch, MD sent at 01/08/2018  6:33 PM EDT ----- Charlett Blake,  I tried to call Mr. Gullick because I realized that I started him on HCTZ and his creatinine is 4. His phone went straight to voicemail and it isn't set up.  Can we call him Monday and I'll keep trying to get him this weekend.  Here is the new plan:  Amlodipine 5mg  Increase lasix to 40mg   Keep plan for BMP next week.  If his BP is high we can increase Cardura.  Sorry!

## 2018-01-12 NOTE — Telephone Encounter (Signed)
Spoke with patient and he stated he thought his leg pain and swelling was actually a gout flare up. Prior to him making any changes he woke up one morning with pain and swelling both gone. Discussed with Dr Oval Linsey and she would like him to just resume the Amlodipine 10 mg daily and Lasix 20 mg daily as previously taking. Advised patient, verbalized understanding.

## 2018-01-16 ENCOUNTER — Encounter: Payer: Self-pay | Admitting: Cardiovascular Disease

## 2018-01-26 ENCOUNTER — Ambulatory Visit (HOSPITAL_COMMUNITY)
Admission: RE | Admit: 2018-01-26 | Discharge: 2018-01-26 | Disposition: A | Discharge status: Home / Self Care | Payer: BLUE CROSS/BLUE SHIELD | Source: Ambulatory Visit | Attending: Cardiovascular Disease | Admitting: Cardiovascular Disease

## 2018-01-26 ENCOUNTER — Other Ambulatory Visit: Payer: Self-pay | Admitting: Cardiovascular Disease

## 2018-01-26 DIAGNOSIS — M79604 Pain in right leg: Secondary | ICD-10-CM

## 2018-01-26 DIAGNOSIS — R200 Anesthesia of skin: Secondary | ICD-10-CM

## 2018-01-26 DIAGNOSIS — R202 Paresthesia of skin: Secondary | ICD-10-CM | POA: Diagnosis present

## 2018-01-26 DIAGNOSIS — M79605 Pain in left leg: Secondary | ICD-10-CM

## 2018-01-26 LAB — BASIC METABOLIC PANEL
BUN/Creatinine Ratio: 19 (ref 9–20)
BUN: 74 mg/dL — ABNORMAL HIGH (ref 6–24)
CHLORIDE: 99 mmol/L (ref 96–106)
CO2: 23 mmol/L (ref 20–29)
CREATININE: 3.9 mg/dL — AB (ref 0.76–1.27)
Calcium: 9.2 mg/dL (ref 8.7–10.2)
GFR calc Af Amer: 19 mL/min/{1.73_m2} — ABNORMAL LOW (ref >59)
GFR calc non Af Amer: 17 mL/min/{1.73_m2} — ABNORMAL LOW (ref >59)
GLUCOSE: 115 mg/dL — AB (ref 65–99)
Potassium: 3.2 mmol/L — ABNORMAL LOW (ref 3.5–5.2)
Sodium: 141 mmol/L (ref 134–144)

## 2018-01-28 ENCOUNTER — Telehealth: Payer: Self-pay | Admitting: *Deleted

## 2018-01-28 ENCOUNTER — Telehealth: Payer: Self-pay | Admitting: Cardiovascular Disease

## 2018-01-28 MED ORDER — POTASSIUM CHLORIDE CRYS ER 20 MEQ PO TBCR: EXTENDED_RELEASE_TABLET | ORAL | 0 refills | Status: DC

## 2018-01-28 NOTE — Telephone Encounter (Signed)
See phone note 10/3

## 2018-01-28 NOTE — Addendum Note (Signed)
Addended by: Alvina Filbert B on: 01/28/2018 05:42 PM   Modules accepted: Level of Service, SmartSet

## 2018-01-28 NOTE — Telephone Encounter (Signed)
This encounter was created in error - please disregard.

## 2018-01-28 NOTE — Telephone Encounter (Signed)
Advised patient of lab results. Patient was to have labs done tomorrow for kidney doctor. Spoke with office and ok to just have done next week and sent BMET to office for their records. Patient aware and will get labs next week

## 2018-01-28 NOTE — Telephone Encounter (Signed)
New Message           Patient returned your call and states if you can't reach him this afternoon because he will be at work he would like for you to call first thing in the morning please. Thank you.

## 2018-01-28 NOTE — Telephone Encounter (Signed)
-----   Message from Skeet Latch, MD sent at 01/26/2018  8:23 PM EDT ----- Kidney function stable.  Potassium is a little low.  Potassium chloride 20 mEq daily for 2 days.

## 2018-02-01 ENCOUNTER — Telehealth: Payer: Self-pay | Admitting: *Deleted

## 2018-02-01 MED ORDER — POTASSIUM CHLORIDE CRYS ER 20 MEQ PO TBCR: EXTENDED_RELEASE_TABLET | ORAL | 0 refills | Status: DC

## 2018-02-01 NOTE — Telephone Encounter (Signed)
Spoke with patient and he will get repeat labs later this week

## 2018-02-15 ENCOUNTER — Ambulatory Visit: Payer: BLUE CROSS/BLUE SHIELD | Admitting: Cardiovascular Disease

## 2018-02-16 ENCOUNTER — Encounter: Payer: Self-pay | Admitting: Cardiovascular Disease

## 2018-02-16 ENCOUNTER — Ambulatory Visit (INDEPENDENT_AMBULATORY_CARE_PROVIDER_SITE_OTHER): Payer: BLUE CROSS/BLUE SHIELD | Admitting: Cardiovascular Disease

## 2018-02-16 VITALS — BP 120/78 | HR 59 | Ht 68.0 in | Wt 234.4 lb

## 2018-02-16 DIAGNOSIS — I10 Essential (primary) hypertension: Secondary | ICD-10-CM | POA: Diagnosis not present

## 2018-02-16 DIAGNOSIS — I5032 Chronic diastolic (congestive) heart failure: Secondary | ICD-10-CM | POA: Diagnosis not present

## 2018-02-16 DIAGNOSIS — I493 Ventricular premature depolarization: Secondary | ICD-10-CM | POA: Diagnosis not present

## 2018-02-16 DIAGNOSIS — Z5181 Encounter for therapeutic drug level monitoring: Secondary | ICD-10-CM

## 2018-02-16 HISTORY — DX: Ventricular premature depolarization: I49.3

## 2018-02-16 LAB — BASIC METABOLIC PANEL
BUN/Creatinine Ratio: 16 (ref 9–20)
BUN: 77 mg/dL — AB (ref 6–24)
CALCIUM: 9.6 mg/dL (ref 8.7–10.2)
CO2: 23 mmol/L (ref 20–29)
CREATININE: 4.76 mg/dL — AB (ref 0.76–1.27)
Chloride: 99 mmol/L (ref 96–106)
GFR calc Af Amer: 15 mL/min/{1.73_m2} — ABNORMAL LOW (ref >59)
GFR calc non Af Amer: 13 mL/min/{1.73_m2} — ABNORMAL LOW (ref >59)
GLUCOSE: 139 mg/dL — AB (ref 65–99)
Potassium: 3.8 mmol/L (ref 3.5–5.2)
Sodium: 140 mmol/L (ref 134–144)

## 2018-02-16 LAB — MAGNESIUM: Magnesium: 2.2 mg/dL (ref 1.6–2.3)

## 2018-02-16 LAB — TSH: TSH: 2.13 u[IU]/mL (ref 0.450–4.500)

## 2018-02-16 NOTE — Patient Instructions (Signed)
Medication Instructions:  DECREASE YOUR FUROSEMIDE TO 40 MG ONCE A DAY   If you need a refill on your cardiac medications before your next appointment, please call your pharmacy.   Lab work: BMET/TSH/MAGNESIUM TODAY  If you have labs (blood work) drawn today and your tests are completely normal, you will receive your results only by: Marland Kitchen MyChart Message (if you have MyChart) OR . A paper copy in the mail If you have any lab test that is abnormal or we need to change your treatment, we will call you to review the results.  Testing/Procedures: NONE  Follow-Up: At [facility], you and your health needs are our priority.  As part of our continuing mission to provide you with exceptional heart care, we have created designated Provider Care Teams.  These Care Teams include your primary Cardiologist (physician) and Advanced Practice Providers (APPs -  Physician Assistants and Nurse Practitioners) who all work together to provide you with the care you need, when you need it. You will need a follow up appointment in 3 months WITH EKG  You may see Skeet Latch, MD or one of the following Advanced Practice Providers on your designated Care Team:   Kerin Ransom, PA-C Roby Lofts, [place] . Sande Rives, PA-C

## 2018-02-16 NOTE — Progress Notes (Signed)
Cardiology Office Note   Date:  02/16/2018   ID:  [name], DOB 03-06-1966, MRN [id number]  PCP:  Charolette Forward, MD  Cardiologist:   Skeet Latch, MD   Chief Complaint  Patient presents with  . Follow-up    pt states no Sx    History of Present Illness: [name] is a 52 y.o. male with hypertension, hyperlipidemia, diabetes, CKD IV, OSA on CPAP, and morbid obesity here for follow-up.  He was initially seen 01/2017 for pre-surgical risk assessment prior to gastric bypass surgery.  At the time he did get much exercise due to chronic knee pain.  He was referred for [facility] 03/2017 that revealed LVEF 48% and no ischemia.  At his last appointment his BP was elevated so metoprolol was switched to carvedilol.  Since then he has been feeling well.  His BP has been well-controlled ranging from 427-062 systolic.  His surgery went well and he has lost 80lb in 9 months.  He works out at [employer] three days per week.  He likes to ride the exercise bike.  He has some exertional dyspnea and lower extremity edema but no orthopnea or PND.  He takes lasix every other day, which seems to help.  He limits the salt in his diet.  His main complaint is numbness in bilateral feet.  Prior to the surgery his diabetes was poorly controlled for many years.    At his last appointment [name] reported LE edema that was initially thought to be 2/2 amlodipine.  However he later discovered that it was because of gout.  He reported numbness in both legs and had ABIs 01/26/2018 that were within normal limits.  He continues to exercise at the gym 3 days/week.  He works second shift and goes right after work so he sometimes feels tired but has no chest pain or shortness of breath.  He has not had any more lower extremity edema.  He denies orthopnea or PND.  Since his last appointment his Lasix was increased to 40 mg in the morning and 20 in the evening.  He was also started on BiDil.  His blood pressure  has been stable.  He has not felt any palpitations, lightheadedness, or dizziness.  Past Medical History:  Diagnosis Date  . Anemia    iron infusions in last few months for anemia   . Arthritis    ankles   . Chronic kidney disease    followed by Dr Arty Baumgartner   . Diabetes mellitus without complication ([place])    type II   . Hypertension   . Obesity   . PVC (premature ventricular contraction) 02/16/2018  . Sinus congestion    all the time  . Sleep apnea    cpap off and on     Past Surgical History:  Procedure Laterality Date  . 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 NASAL SEPTUM SURGERY    . QUADRICEPS TENDON REPAIR  05/13/2012   Procedure: REPAIR QUADRICEP TENDON;  Surgeon: Augustin Schooling, MD;  Location: [facility];  Service: Orthopedics;  Laterality: Right;  . UMBILICAL HERNIA REPAIR N/A 07/09/2017   Procedure: LAPAROSCOPIC UMBILICAL HERNIA WITH MESH;  Surgeon: [name], [name], MD;  Location: [facility];  Service: General;  Laterality: N/A;     Current Outpatient Medications  Medication Sig  Dispense Refill  . acetaminophen (TYLENOL) 500 MG tablet Take 1,000 mg by mouth every 6 (six) hours as needed (for pain.).    Marland Kitchen ALLOPURINOL PO Take by mouth.    Marland Kitchen amLODipine (NORVASC) 10 MG tablet Take 10 mg by mouth daily.    Marland Kitchen aspirin 81 MG chewable tablet Chew 1 tablet (81 mg total) by mouth daily. 1 tablet   . carvedilol (COREG) 12.5 MG tablet TAKE 1 TABLET (12.5 MG TOTAL) BY MOUTH 2 (TWO) TIMES DAILY. 180 tablet 1  . doxazosin (CARDURA) 4 MG tablet Take 4 mg by mouth at bedtime.     . furosemide (LASIX) 20 MG tablet Take 40 mg by mouth.     . isosorbide-hydrALAZINE (BIDIL) 20-37.5 MG tablet Take 2 tablets by mouth 3 (three) times daily.     No current facility-administered medications for this visit.     Allergies:   Patient  has no known allergies.    Social History:  The patient  reports that he has never smoked. He has never used smokeless tobacco. He reports that he drinks about 5.0 standard drinks of alcohol per week. He reports that he does not use drugs.   Family History:  The patient's family history includes Cataracts in his father; Hypertension in his mother and other.    ROS:  Please see the history of present illness.   Otherwise, review of systems are positive for none.   All other systems are reviewed and negative.    PHYSICAL EXAM: VS:  BP 120/78   Pulse (!) 59   Ht 5\' 8"  (1.727 m)   Wt 234 lb 6.4 oz (106.3 kg)   BMI 35.64 kg/m  , BMI Body mass index is 35.64 kg/m. GENERAL:  Well appearing HEENT: Pupils equal round and reactive, fundi not visualized, oral mucosa unremarkable NECK:  No jugular venous distention, waveform within normal limits, carotid upstroke brisk and symmetric, no bruits LUNGS:  Clear to auscultation bilaterally HEART:  RRR.  PMI not displaced or sustained,S1 and S2 within normal limits, no S3, no S4, no clicks, no rubs, no murmurs ABD:  Flat, positive bowel sounds normal in frequency in pitch, no bruits, no rebound, no guarding, no midline pulsatile mass, no hepatomegaly, no splenomegaly EXT:  2 plus pulses throughout, no edema, no cyanosis no clubbing SKIN:  No rashes no nodules NEURO:  Cranial nerves II through XII grossly intact, motor grossly intact throughout PSYCH:  Cognitively intact, oriented to person place and time   EKG:  EKG is ordered today. The ekg ordered today demonstrates sinus bradycardia.  Rate 49 bpm.   02/16/2018: Sinus bradycardia.  Frequent PVCs.  Echo 04/23/17: Study Conclusions  - Left ventricle: The cavity size was normal. There was mild   concentric hypertrophy. Systolic function was normal. The   estimated ejection fraction was in the range of 55% to 60%. Wall   motion was normal; there were no regional wall motion   abnormalities.  Features are consistent with a pseudonormal left   ventricular filling pattern, with concomitant abnormal relaxation   and increased filling pressure (grade 2 diastolic dysfunction). - Mitral valve: There was mild regurgitation. - Left atrium: The atrium was mildly dilated.   Lexiscan Myoview 01/2017:  Normal perfusion.  Nuclear stress EF: 48%. Visual estimate, LVEF appears greater Recomm echo to confirm LVEF  This is a low risk study.   Recent Labs: 07/27/2017: ALT 18; B Natriuretic Peptide 31.2 07/29/2017: Platelets 300 11/19/2017: Hemoglobin 9.6 01/26/2018: BUN 74;  Creatinine, Ser 3.90; Potassium 3.2; Sodium 141    Lipid Panel No results found for: CHOL, TRIG, HDL, CHOLHDL, VLDL, LDLCALC, LDLDIRECT   09/19/2017: Hemoglobin A1c 6.5%  11/19/2017: Potassium 3.7, creatinine 4.01 Hemoglobin 6.9, platelets 238  Wt Readings from Last 3 Encounters:  02/16/18 234 lb 6.4 oz (106.3 kg)  01/08/18 255 lb 3.2 oz (115.8 kg)  01/05/18 258 lb (117 kg)      ASSESSMENT AND PLAN:  # Hypertension: BP is well controlled.  Continue amlodipine, carvedilol, doxazosin, and Bidil.  # Chronic diastolic heart failure: [name] is euvolemic.  Reduce lasix to 40mg  daily.  If he develops increased lower extremity edema we can add back to 20 mg dose in the evening.  Check a BMP today.    # PVCs:  Frequent PVCs noted.  He is completely asymptomatic.  Given that he exercises regularly and has no exertional symptoms it seems unlikely that this is due to ischemia.  It is likely due to hypokalemia in the setting of higher doses of Lasix.  We will check a basic metabolic panel, magnesium, and TSH today.  Continue carvedilol.  # Leg numbness:  Peripheral pulses are diminished. ABIs normal 01/2018.   Current medicines are reviewed at length with the patient today.  The patient does not have concerns regarding medicines.  The following changes have been made:  no change  Labs/ tests ordered today  include:   Orders Placed This Encounter  Procedures  . Magnesium  . Basic metabolic panel  . TSH  . EKG 12-Lead     Disposition:   FU with Kevin Bubeck C. Oval Linsey, MD, [facility] in  3 months.      Signed, Cyril Woodmansee C. Oval Linsey, MD, [facility]  02/16/2018 8:51 AM    [facility]

## 2018-02-17 ENCOUNTER — Telehealth: Payer: Self-pay | Admitting: *Deleted

## 2018-02-17 NOTE — Telephone Encounter (Signed)
Discussed with Dr Oval Linsey and patient needs to hold Furosemide X 1 day then Furosemide 40 mg daily. Recheck BMET in 1-2 weeks Tried calling patient, unable to reach and no voicemail set up. Will try again

## 2018-02-18 ENCOUNTER — Encounter: Payer: Self-pay | Admitting: *Deleted

## 2018-02-18 NOTE — Telephone Encounter (Signed)
Still unable to reach patient or leave message, mailed letter to contact office regarding labs

## 2018-02-25 ENCOUNTER — Telehealth: Payer: Self-pay | Admitting: Cardiovascular Disease

## 2018-02-25 DIAGNOSIS — I5032 Chronic diastolic (congestive) heart failure: Secondary | ICD-10-CM

## 2018-02-25 NOTE — Telephone Encounter (Signed)
Spoke with pt. Pt made aware of lab results and Dr. Blenda Mounts recommendation.  Notes recorded by Earvin Hansen, LPN on [id number]/09/9859 at 4:58 PM EDT Discussed with Dr Oval Linsey and patient needs to hold Furosemide X 1 day then Furosemide 40 mg daily. Recheck BMET in 1-2 weeks Tried calling patient, unable to reach and no voicemail set up. Will try again.  Pt sts that he is scheduled to have lab work with his nephrologist soon. Adv pt that if not with in the 1-2 wk time frame he can come to our office to have his bmet repeated (order in [place]). If labs are done at his kidney he can have the results shared with Dr.Benson. Pt verbalized understanding to the instructions given.

## 2018-02-25 NOTE — Telephone Encounter (Signed)
Follow up:   Patient returning call back from 02/17/18 regarding labs.

## 2018-03-01 ENCOUNTER — Other Ambulatory Visit (HOSPITAL_COMMUNITY): Payer: Self-pay

## 2018-03-01 NOTE — Discharge Instructions (Signed)
Darbepoetin Alfa injection °What is this medicine? °DARBEPOETIN ALFA (dar be POE e tin AL fa) helps your body make more red blood cells. It is used to treat anemia caused by chronic kidney failure and chemotherapy. °This medicine may be used for other purposes; ask your health care provider or pharmacist if you have questions. °COMMON BRAND NAME(S): Aranesp °What should I tell my health care provider before I take this medicine? °They need to know if you have any of these conditions: °-blood clotting disorders or history of blood clots °-cancer patient not on chemotherapy °-cystic fibrosis °-heart disease, such as angina, heart failure, or a history of a heart attack °-hemoglobin level of 12 g/dL or greater °-high blood pressure °-low levels of folate, iron, or vitamin B12 °-seizures °-an unusual or allergic reaction to darbepoetin, erythropoietin, albumin, hamster proteins, latex, other medicines, foods, dyes, or preservatives °-pregnant or trying to get pregnant °-breast-feeding °How should I use this medicine? °This medicine is for injection into a vein or under the skin. It is usually given by a health care professional in a hospital or clinic setting. °If you get this medicine at home, you will be taught how to prepare and give this medicine. Use exactly as directed. Take your medicine at regular intervals. Do not take your medicine more often than directed. °It is important that you put your used needles and syringes in a special sharps container. Do not put them in a trash can. If you do not have a sharps container, call your pharmacist or healthcare provider to get one. °A special MedGuide will be given to you by the pharmacist with each prescription and refill. Be sure to read this information carefully each time. °Talk to your pediatrician regarding the use of this medicine in children. While this medicine may be used in children as young as 1 year for selected conditions, precautions do  apply. °Overdosage: If you think you have taken too much of this medicine contact a poison control center or emergency room at once. °NOTE: This medicine is only for you. Do not share this medicine with others. °What if I miss a dose? °If you miss a dose, take it as soon as you can. If it is almost time for your next dose, take only that dose. Do not take double or extra doses. °What may interact with this medicine? °Do not take this medicine with any of the following medications: °-epoetin alfa °This list may not describe all possible interactions. Give your health care provider a list of all the medicines, herbs, non-prescription drugs, or dietary supplements you use. Also tell them if you smoke, drink alcohol, or use illegal drugs. Some items may interact with your medicine. °What should I watch for while using this medicine? °Your condition will be monitored carefully while you are receiving this medicine. °You may need blood work done while you are taking this medicine. °What side effects may I notice from receiving this medicine? °Side effects that you should report to your doctor or health care professional as soon as possible: °-allergic reactions like skin rash, itching or hives, swelling of the face, lips, or tongue °-breathing problems °-changes in vision °-chest pain °-confusion, trouble speaking or understanding °-feeling faint or lightheaded, falls °-high blood pressure °-muscle aches or pains °-pain, swelling, warmth in the leg °-rapid weight gain °-severe headaches °-sudden numbness or weakness of the face, arm or leg °-trouble walking, dizziness, loss of balance or coordination °-seizures (convulsions) °-swelling of the ankles, feet, hands °-  unusually weak or tired °Side effects that usually do not require medical attention (report to your doctor or health care professional if they continue or are bothersome): °-diarrhea °-fever, chills (flu-like symptoms) °-headaches °-nausea, vomiting °-redness,  stinging, or swelling at site where injected °This list may not describe all possible side effects. Call your doctor for medical advice about side effects. You may report side effects to FDA at 1-800-FDA-1088. °Where should I keep my medicine? °Keep out of the reach of children. °Store in a refrigerator between 2 and 8 degrees C (36 and 46 degrees F). Do not freeze. Do not shake. Throw away any unused portion if using a single-dose vial. Throw away any unused medicine after the expiration date. °NOTE: This sheet is a summary. It may not cover all possible information. If you have questions about this medicine, talk to your doctor, pharmacist, or health care provider. °© 2018 [employer]vier/[employer] Standard (2015-12-03 19:52:26) °Ferumoxytol injection °What is this medicine? °FERUMOXYTOL is an iron complex. Iron is used to make healthy red blood cells, which carry oxygen and nutrients throughout the body. This medicine is used to treat iron deficiency anemia in people with chronic kidney disease. °This medicine may be used for other purposes; ask your health care provider or pharmacist if you have questions. °COMMON BRAND NAME(S): Feraheme °What should I tell my health care provider before I take this medicine? °They need to know if you have any of these conditions: °-anemia not caused by low iron levels °-high levels of iron in the blood °-magnetic resonance imaging (MRI) test scheduled °-an unusual or allergic reaction to iron, other medicines, foods, dyes, or preservatives °-pregnant or trying to get pregnant °-breast-feeding °How should I use this medicine? °This medicine is for injection into a vein. It is given by a health care professional in a hospital or clinic setting. °Talk to your pediatrician regarding the use of this medicine in children. Special care may be needed. °Overdosage: If you think you have taken too much of this medicine contact a poison control center or emergency room at once. °NOTE: This medicine  is only for you. Do not share this medicine with others. °What if I miss a dose? °It is important not to miss your dose. Call your doctor or health care professional if you are unable to keep an appointment. °What may interact with this medicine? °This medicine may interact with the following medications: °-other iron products °This list may not describe all possible interactions. Give your health care provider a list of all the medicines, herbs, non-prescription drugs, or dietary supplements you use. Also tell them if you smoke, drink alcohol, or use illegal drugs. Some items may interact with your medicine. °What should I watch for while using this medicine? °Visit your doctor or healthcare professional regularly. Tell your doctor or healthcare professional if your symptoms do not start to get better or if they get worse. You may need blood work done while you are taking this medicine. °You may need to follow a special diet. Talk to your doctor. Foods that contain iron include: whole grains/cereals, dried fruits, beans, or peas, leafy green vegetables, and organ meats (liver, kidney). °What side effects may I notice from receiving this medicine? °Side effects that you should report to your doctor or health care professional as soon as possible: °-allergic reactions like skin rash, itching or hives, swelling of the face, lips, or tongue °-breathing problems °-changes in blood pressure °-feeling faint or lightheaded, falls °-fever or chills °-flushing,   sweating, or hot feelings °-swelling of the ankles or feet °Side effects that usually do not require medical attention (report to your doctor or health care professional if they continue or are bothersome): °-diarrhea °-headache °-nausea, vomiting °-stomach pain °This list may not describe all possible side effects. Call your doctor for medical advice about side effects. You may report side effects to FDA at 1-800-FDA-1088. °Where should I keep my medicine? °This drug  is given in a hospital or clinic and will not be stored at home. °NOTE: This sheet is a summary. It may not cover all possible information. If you have questions about this medicine, talk to your doctor, pharmacist, or health care provider. °© 2018 [employer]vier/Gold Standard (2015-05-17 12:41:49) ° °

## 2018-03-02 ENCOUNTER — Ambulatory Visit (HOSPITAL_COMMUNITY)
Admission: RE | Admit: 2018-03-02 | Discharge: 2018-03-02 | Disposition: A | Discharge status: Home / Self Care | Payer: BLUE CROSS/BLUE SHIELD | Source: Ambulatory Visit | Attending: Nephrology | Admitting: Nephrology

## 2018-03-02 DIAGNOSIS — D631 Anemia in chronic kidney disease: Secondary | ICD-10-CM | POA: Insufficient documentation

## 2018-03-02 DIAGNOSIS — N184 Chronic kidney disease, stage 4 (severe): Secondary | ICD-10-CM | POA: Insufficient documentation

## 2018-03-02 LAB — POCT HEMOGLOBIN-HEMACUE: HEMOGLOBIN: 8.9 g/dL — AB (ref 13.0–17.0)

## 2018-03-02 MED ORDER — SODIUM CHLORIDE 0.9 % IV SOLN
510.0000 mg | INTRAVENOUS | Status: DC
  Administered 2018-03-02: 510 mg via INTRAVENOUS
  Filled 2018-03-02: qty 17

## 2018-03-02 MED ORDER — DARBEPOETIN ALFA 60 MCG/0.3ML IJ SOSY: 60.0000 ug | PREFILLED_SYRINGE | INTRAMUSCULAR | Status: DC

## 2018-03-02 MED ORDER — DARBEPOETIN ALFA 60 MCG/0.3ML IJ SOSY
PREFILLED_SYRINGE | INTRAMUSCULAR | Status: AC
  Administered 2018-03-02: 60 ug via SUBCUTANEOUS
  Filled 2018-03-02: qty 0.3

## 2018-03-09 ENCOUNTER — Ambulatory Visit (HOSPITAL_COMMUNITY)
Admission: RE | Admit: 2018-03-09 | Discharge: 2018-03-09 | Disposition: A | Discharge status: Home / Self Care | Payer: BLUE CROSS/BLUE SHIELD | Source: Ambulatory Visit | Attending: Nephrology | Admitting: Nephrology

## 2018-03-09 DIAGNOSIS — N184 Chronic kidney disease, stage 4 (severe): Secondary | ICD-10-CM | POA: Insufficient documentation

## 2018-03-09 DIAGNOSIS — D631 Anemia in chronic kidney disease: Secondary | ICD-10-CM | POA: Insufficient documentation

## 2018-03-09 MED ORDER — SODIUM CHLORIDE 0.9 % IV SOLN
510.0000 mg | INTRAVENOUS | Status: AC
  Administered 2018-03-09: 510 mg via INTRAVENOUS
  Filled 2018-03-09: qty 17

## 2018-03-29 ENCOUNTER — Other Ambulatory Visit (HOSPITAL_COMMUNITY): Payer: Self-pay

## 2018-03-30 ENCOUNTER — Ambulatory Visit (HOSPITAL_COMMUNITY)
Admission: RE | Admit: 2018-03-30 | Discharge: 2018-03-30 | Disposition: A | Discharge status: Home / Self Care | Payer: BLUE CROSS/BLUE SHIELD | Source: Ambulatory Visit | Attending: Nephrology | Admitting: Nephrology

## 2018-03-30 DIAGNOSIS — Z5181 Encounter for therapeutic drug level monitoring: Secondary | ICD-10-CM | POA: Insufficient documentation

## 2018-03-30 DIAGNOSIS — Z79899 Other long term (current) drug therapy: Secondary | ICD-10-CM | POA: Diagnosis not present

## 2018-03-30 DIAGNOSIS — N184 Chronic kidney disease, stage 4 (severe): Secondary | ICD-10-CM | POA: Insufficient documentation

## 2018-03-30 DIAGNOSIS — D631 Anemia in chronic kidney disease: Secondary | ICD-10-CM | POA: Insufficient documentation

## 2018-03-30 LAB — CBC WITH DIFFERENTIAL/PLATELET
ABS IMMATURE GRANULOCYTES: 0.01 10*3/uL (ref 0.00–0.07)
Basophils Absolute: 0.1 10*3/uL (ref 0.0–0.1)
Basophils Relative: 2 %
Eosinophils Absolute: 0.3 10*3/uL (ref 0.0–0.5)
Eosinophils Relative: 6 %
HCT: 31.4 % — ABNORMAL LOW (ref 39.0–52.0)
Hemoglobin: 10 g/dL — ABNORMAL LOW (ref 13.0–17.0)
IMMATURE GRANULOCYTES: 0 %
Lymphocytes Relative: 29 %
Lymphs Abs: 1.3 10*3/uL (ref 0.7–4.0)
MCH: 29.6 pg (ref 26.0–34.0)
MCHC: 31.8 g/dL (ref 30.0–36.0)
MCV: 92.9 fL (ref 80.0–100.0)
MONOS PCT: 9 %
Monocytes Absolute: 0.4 10*3/uL (ref 0.1–1.0)
NEUTROS ABS: 2.4 10*3/uL (ref 1.7–7.7)
NEUTROS PCT: 54 %
Platelets: 199 10*3/uL (ref 150–400)
RBC: 3.38 MIL/uL — ABNORMAL LOW (ref 4.22–5.81)
RDW: 14.1 % (ref 11.5–15.5)
WBC: 4.5 10*3/uL (ref 4.0–10.5)
nRBC: 0 % (ref 0.0–0.2)

## 2018-03-30 LAB — RENAL FUNCTION PANEL
Albumin: 3.5 g/dL (ref 3.5–5.0)
Anion gap: 7 (ref 5–15)
BUN: 55 mg/dL — ABNORMAL HIGH (ref 6–20)
CALCIUM: 8.7 mg/dL — AB (ref 8.9–10.3)
CHLORIDE: 106 mmol/L (ref 98–111)
CO2: 24 mmol/L (ref 22–32)
Creatinine, Ser: 3.53 mg/dL — ABNORMAL HIGH (ref 0.61–1.24)
GFR, EST AFRICAN AMERICAN: 22 mL/min — AB (ref >60)
GFR, EST NON AFRICAN AMERICAN: 19 mL/min — AB (ref >60)
Glucose, Bld: 138 mg/dL — ABNORMAL HIGH (ref 70–99)
PHOSPHORUS: 3.5 mg/dL (ref 2.5–4.6)
Potassium: 3.3 mmol/L — ABNORMAL LOW (ref 3.5–5.1)
SODIUM: 137 mmol/L (ref 135–145)

## 2018-03-30 MED ORDER — DARBEPOETIN ALFA 60 MCG/0.3ML IJ SOSY
PREFILLED_SYRINGE | INTRAMUSCULAR | Status: AC
  Filled 2018-03-30: qty 0.3

## 2018-03-30 MED ORDER — DARBEPOETIN ALFA 60 MCG/0.3ML IJ SOSY
60.0000 ug | PREFILLED_SYRINGE | INTRAMUSCULAR | Status: DC
  Administered 2018-03-30: 60 ug via SUBCUTANEOUS

## 2018-03-31 ENCOUNTER — Telehealth: Payer: Self-pay | Admitting: Cardiovascular Disease

## 2018-03-31 NOTE — Telephone Encounter (Signed)
Received records from [facility] on 03/31/18, Appt 05/19/18 @ 9:20AM.[place]

## 2018-04-02 ENCOUNTER — Other Ambulatory Visit: Payer: Self-pay | Admitting: Cardiovascular Disease

## 2018-04-06 ENCOUNTER — Encounter: Payer: BLUE CROSS/BLUE SHIELD | Attending: General Surgery | Admitting: Registered"

## 2018-04-06 ENCOUNTER — Encounter: Payer: Self-pay | Admitting: Registered"

## 2018-04-06 DIAGNOSIS — Z713 Dietary counseling and surveillance: Secondary | ICD-10-CM | POA: Diagnosis not present

## 2018-04-06 DIAGNOSIS — Z9884 Bariatric surgery status: Secondary | ICD-10-CM | POA: Diagnosis not present

## 2018-04-06 DIAGNOSIS — E119 Type 2 diabetes mellitus without complications: Secondary | ICD-10-CM

## 2018-04-06 NOTE — Progress Notes (Signed)
Follow-up visit: 9 Months Post-Operative RYGB Surgery  Medical Nutrition Therapy:  Appt start time: 11:15 end time: 11:35  Primary concerns today: Post-operative Bariatric Surgery Nutrition Management.  Non scale victories: increased mobility, reduced diabetes medications, more energy, wearing smaller clothes, more room in booths when eating out improved knee and ankle pain  Surgery date: 07/07/2017 Surgery type: RYGB Start weight at [facility]: 338.2 Weight today: 231.9 lbs Weight change: 26.1 lbs loss from 258.0 (01/05/2018) Total weight lost: 106.3 lbs Weight loss goal: increase energy, improve knee and ankle pain, reduce medications   TANITA  BODY COMP RESULTS  07/21/2017 09/08/2017 09/22/2017 01/05/2018   BMI (kg/m^2) Pt declined Pt used regular scale Pt used regular scale Pt used regular scale   Fat Mass (lbs)       Fat Free Mass (lbs)       Total Body Water (lbs)        Pt arrives with wife and states everything is going well. Pt states he eats bread 3 times/week, rice sometimes. Pt states he is ttill weighing some food but states he knows how much he is eating and wants to verify he is eating enough protein. Pt states he has bowel movements every 2-3 days. Pt states he completed [employer] program and has started [employer]. Pt is doing well with behavioral changes.   Pt states he does not like greek yogurt.  Preferred Learning Style:   No preference indicated   Learning Readiness:   Ready  Change in progress  24-hr recall:  B (AM): egg (6g) + steak (14g)  Snk (AM): sometimes pork rinds or nuts (7-8g)  L (2 PM): chicken wings (21g) + green bean casserole  Snk (PM): sometimes pork rinds or nuts (7-8g)  D (8 PM): chicken wings (21g) + green bean casserole  Snk (PM): none  Fluid intake: water sometimes with flavor pack (1.5-2 times of 64 ounce container); 100+ ounces Estimated total protein intake: 80+ grams  Medications: See list; takes glimipiride Supplementation: ProCare  Health capsules + 3 calcium   CBG monitoring: yes, daily Average CBG per patient:  FBS 107-115 Last patient reported A1c: 9.6 (11/19/2017)  Using straws: no Drinking while eating: no Having you been chewing well: yes Chewing/swallowing difficulties: no Changes in vision: no Changes to mood/headaches: no Hair loss/Changes to skin/Changes to nails: no, no, no Any difficulty focusing or concentrating: no Sweating: no Dizziness/Lightheaded: no Palpitations: no  Carbonated beverages: no N/V/D/C/GAS: no, no, no, sometimes-Miralax, no Abdominal Pain: no Dumping syndrome: no Last Lap-Band fill: N/A  Recent physical activity:  [employer] 60 min, 3x/week + biking 1-2 times/week  Progress Towards Goal(s):  In progress.  Handouts given during visit include:  Phase IV: High protein + All vegetables + Fruit   Nutritional Diagnosis:  [place]-3.3 Overweight/obesity related to past poor dietary habits and physical inactivity as evidenced by patient w/ recent RYGB surgery following dietary guidelines for continued weight loss.     Intervention:  Nutrition education and counseling. Pt was educated and counseled on how to introduce fruit to daily regimen, eating his protein first, and not drinking with meals. Pt was encouraged to keep up the great work.  Goals:  Follow Phase V: High Protein + All Vegetables + Fruit  Eat 3-6 small meals/snacks, every 3-5 hrs  Increase lean protein foods to meet 80g goal  Increase fluid intake to 64oz +  Avoid drinking 15 minutes before, during and 30 minutes after eating  Aim for >30 min of physical activity daily  Teaching Method Utilized:  Visual Auditory Hands on  Barriers to learning/adherence to lifestyle change: none identified  Demonstrated degree of understanding via:  Teach Back   Monitoring/Evaluation:  Dietary intake, exercise, lap band fills, and body weight. Follow up in 3 months for 12 month post-op visit.

## 2018-04-27 ENCOUNTER — Encounter (HOSPITAL_COMMUNITY): Payer: Self-pay

## 2018-05-04 ENCOUNTER — Other Ambulatory Visit (HOSPITAL_COMMUNITY): Payer: Self-pay | Admitting: *Deleted

## 2018-05-05 ENCOUNTER — Encounter: Payer: Self-pay | Admitting: Nephrology

## 2018-05-05 ENCOUNTER — Inpatient Hospital Stay (HOSPITAL_COMMUNITY): Admission: RE | Admit: 2018-05-05 | Payer: Self-pay | Source: Ambulatory Visit

## 2018-05-14 ENCOUNTER — Ambulatory Visit (HOSPITAL_COMMUNITY)
Admission: RE | Admit: 2018-05-14 | Discharge: 2018-05-14 | Disposition: A | Discharge status: Home / Self Care | Payer: 59 | Source: Ambulatory Visit | Attending: Nephrology | Admitting: Nephrology

## 2018-05-14 DIAGNOSIS — D631 Anemia in chronic kidney disease: Secondary | ICD-10-CM | POA: Insufficient documentation

## 2018-05-14 DIAGNOSIS — N184 Chronic kidney disease, stage 4 (severe): Secondary | ICD-10-CM | POA: Insufficient documentation

## 2018-05-14 LAB — CBC WITH DIFFERENTIAL/PLATELET
ABS IMMATURE GRANULOCYTES: 0.01 10*3/uL (ref 0.00–0.07)
Basophils Absolute: 0.1 10*3/uL (ref 0.0–0.1)
Basophils Relative: 2 %
EOS PCT: 4 %
Eosinophils Absolute: 0.2 10*3/uL (ref 0.0–0.5)
HCT: 32 % — ABNORMAL LOW (ref 39.0–52.0)
HEMOGLOBIN: 10.4 g/dL — AB (ref 13.0–17.0)
Immature Granulocytes: 0 %
LYMPHS PCT: 28 %
Lymphs Abs: 1.5 10*3/uL (ref 0.7–4.0)
MCH: 29.6 pg (ref 26.0–34.0)
MCHC: 32.5 g/dL (ref 30.0–36.0)
MCV: 91.2 fL (ref 80.0–100.0)
MONO ABS: 0.5 10*3/uL (ref 0.1–1.0)
MONOS PCT: 10 %
NEUTROS ABS: 2.9 10*3/uL (ref 1.7–7.7)
Neutrophils Relative %: 56 %
Platelets: 208 10*3/uL (ref 150–400)
RBC: 3.51 MIL/uL — ABNORMAL LOW (ref 4.22–5.81)
RDW: 14.1 % (ref 11.5–15.5)
WBC: 5.2 10*3/uL (ref 4.0–10.5)
nRBC: 0 % (ref 0.0–0.2)

## 2018-05-14 LAB — RENAL FUNCTION PANEL
ALBUMIN: 3.4 g/dL — AB (ref 3.5–5.0)
Anion gap: 9 (ref 5–15)
BUN: 58 mg/dL — AB (ref 6–20)
CALCIUM: 8.6 mg/dL — AB (ref 8.9–10.3)
CO2: 22 mmol/L (ref 22–32)
CREATININE: 4.22 mg/dL — AB (ref 0.61–1.24)
Chloride: 109 mmol/L (ref 98–111)
GFR calc Af Amer: 17 mL/min — ABNORMAL LOW (ref >60)
GFR, EST NON AFRICAN AMERICAN: 15 mL/min — AB (ref >60)
Glucose, Bld: 124 mg/dL — ABNORMAL HIGH (ref 70–99)
Phosphorus: 5.2 mg/dL — ABNORMAL HIGH (ref 2.5–4.6)
Potassium: 4 mmol/L (ref 3.5–5.1)
SODIUM: 140 mmol/L (ref 135–145)

## 2018-05-14 LAB — IRON AND TIBC
Iron: 67 ug/dL (ref 45–182)
SATURATION RATIOS: 29 % (ref 17.9–39.5)
TIBC: 234 ug/dL — ABNORMAL LOW (ref 250–450)
UIBC: 167 ug/dL

## 2018-05-14 LAB — POCT HEMOGLOBIN-HEMACUE: HEMOGLOBIN: 10.5 g/dL — AB (ref 13.0–17.0)

## 2018-05-14 LAB — FERRITIN: Ferritin: 475 ng/mL — ABNORMAL HIGH (ref 24–336)

## 2018-05-14 MED ORDER — DARBEPOETIN ALFA 60 MCG/0.3ML IJ SOSY
60.0000 ug | PREFILLED_SYRINGE | Freq: Once | INTRAMUSCULAR | Status: AC
  Administered 2018-05-14: 60 ug via SUBCUTANEOUS

## 2018-05-14 MED ORDER — DARBEPOETIN ALFA 60 MCG/0.3ML IJ SOSY
PREFILLED_SYRINGE | INTRAMUSCULAR | Status: AC
  Filled 2018-05-14: qty 0.3

## 2018-05-19 ENCOUNTER — Encounter: Payer: Self-pay | Admitting: Cardiovascular Disease

## 2018-05-19 ENCOUNTER — Ambulatory Visit (INDEPENDENT_AMBULATORY_CARE_PROVIDER_SITE_OTHER): Payer: 59 | Admitting: Cardiovascular Disease

## 2018-05-19 VITALS — BP 130/74 | HR 69 | Ht 68.0 in | Wt 229.8 lb

## 2018-05-19 DIAGNOSIS — I10 Essential (primary) hypertension: Secondary | ICD-10-CM | POA: Diagnosis not present

## 2018-05-19 DIAGNOSIS — I5032 Chronic diastolic (congestive) heart failure: Secondary | ICD-10-CM | POA: Diagnosis not present

## 2018-05-19 DIAGNOSIS — I493 Ventricular premature depolarization: Secondary | ICD-10-CM | POA: Diagnosis not present

## 2018-05-19 DIAGNOSIS — Z13220 Encounter for screening for lipoid disorders: Secondary | ICD-10-CM

## 2018-05-19 LAB — LIPID PANEL
CHOL/HDL RATIO: 2.4 ratio (ref 0.0–5.0)
Cholesterol, Total: 135 mg/dL (ref 100–199)
HDL: 56 mg/dL (ref >39)
LDL CALC: 67 mg/dL (ref 0–99)
Triglycerides: 60 mg/dL (ref 0–149)
VLDL CHOLESTEROL CAL: 12 mg/dL (ref 5–40)

## 2018-05-19 NOTE — Patient Instructions (Signed)
Medication Instructions:  Your physician recommends that you continue on your current medications as directed. Please refer to the Current Medication list given to you today.  If you need a refill on your cardiac medications before your next appointment, please call your pharmacy.   Lab work: FASTING LIPID   If you have labs (blood work) drawn today and your tests are completely normal, you will receive your results only by: Marland Kitchen MyChart Message (if you have MyChart) OR . A paper copy in the mail If you have any lab test that is abnormal or we need to change your treatment, we will call you to review the results.  Testing/Procedures: NONE  Follow-Up: At [facility], you and your health needs are our priority.  As part of our continuing mission to provide you with exceptional heart care, we have created designated Provider Care Teams.  These Care Teams include your primary Cardiologist (physician) and Advanced Practice Providers (APPs -  Physician Assistants and Nurse Practitioners) who all work together to provide you with the care you need, when you need it. You will need a follow up appointment in 12 months.  Please call our office 2 months in advance to schedule this appointment.  You may see Skeet Latch, MD or one of the following Advanced Practice Providers on your designated Care Team:   Kerin Ransom, PA-C Roby Lofts, [place] . Sande Rives, PA-C  Any Other Special Instructions Will Be Listed Below (If Applicable). TRY USING COMPRESSION SOCKS, MILD STRENGHT

## 2018-05-19 NOTE — Progress Notes (Signed)
Cardiology Office Note   Date:  05/19/2018   ID:  [name], DOB [birth year], MRN [id number]  PCP:  Charolette Forward, MD  Cardiologist:   Skeet Latch, MD   No chief complaint on file.   History of Present Illness: [name] is a 53 y.o. male with hypertension, hyperlipidemia, diabetes, CKD IV, OSA on CPAP, and morbid obesity here for follow-up.  He was initially seen 01/2017 for pre-surgical risk assessment prior to gastric bypass surgery.  At the time he did get much exercise due to chronic knee pain.  He was referred for [facility] 03/2017 that revealed LVEF 48% and no ischemia.  At his last appointment his BP was elevated so metoprolol was switched to carvedilol.  Since then he has been feeling well.  His BP has been well-controlled ranging from 465-035 systolic.  His surgery went well and he has lost 80lb in 9 months.  He works out at [employer] three days per week.  He likes to ride the exercise bike.  He has some exertional dyspnea and lower extremity edema but no orthopnea or PND.  He takes lasix every other day, which seems to help.  He limits the salt in his diet.  His main complaint is numbness in bilateral feet.  Prior to the surgery his diabetes was poorly controlled for many years.    At his last appointment [name] reported LE edema that was initially thought to be 2/2 amlodipine.  However he later discovered that it was because of gout.  He reported numbness in both legs and had ABIs 01/26/2018 that were within normal limits.  He continues to exercise at the gym 3 days/week.  He works second shift and goes right after work so he sometimes feels tired but has no chest pain or shortness of breath.  He has not had any more lower extremity edema.  He denies orthopnea or PND.  Since his last appointment his Lasix was increased to 40 mg in the morning and 20 in the evening.  He was also started on BiDil.  His blood pressure has been stable.  He has not felt any  palpitations, lightheadedness, or dizziness.  At his last appointment [name] was noted to have frequent PVCs.  He was hypokalemic and euvolemic so lasix was reduced.   Since then he has been feeling well.  He exercises 3 days/week at [employer].  He rides her bike and does resistance training.  He has no exertional chest pain or shortness of breath.  He does have some lower extremity edema at the end of the day.  He denies orthopnea or PND.  He notes that he is on his legs a lot at work.   Past Medical History:  Diagnosis Date  . Anemia    iron infusions in last few months for anemia   . Arthritis    ankles   . Chronic kidney disease    followed by Dr Arty Baumgartner   . Diabetes mellitus without complication ([place])    type II   . History of anemia due to CKD   . Hypertension   . Obesity   . PVC (premature ventricular contraction) 02/16/2018  . Sinus congestion    all the time  . Sleep apnea    cpap off and on     Past Surgical History:  Procedure Laterality Date  . GASTRIC ROUX-EN-Y N/A 07/07/2017   Procedure: LAPAROSCOPIC ROUX-EN-Y GASTRIC BYPASS WITH UPPER ENDOSCOPY;  Surgeon: Kieth Brightly,  [name], MD;  Location: [facility];  Service: General;  Laterality: N/A;  . NASAL SEPTUM SURGERY    . QUADRICEPS TENDON REPAIR  05/13/2012   Procedure: REPAIR QUADRICEP TENDON;  Surgeon: Augustin Schooling, MD;  Location: [place];  Service: Orthopedics;  Laterality: Right;  . UMBILICAL HERNIA REPAIR N/A 07/09/2017   Procedure: LAPAROSCOPIC UMBILICAL HERNIA WITH MESH;  Surgeon: [name], [name], MD;  Location: [facility];  Service: General;  Laterality: N/A;     Current Outpatient Medications  Medication Sig Dispense Refill  . acetaminophen (TYLENOL) 500 MG tablet Take 1,000 mg by mouth every 6 (six) hours as needed (for pain.).    Marland Kitchen ALLOPURINOL PO Take by mouth.    Marland Kitchen amLODipine (NORVASC) 10 MG tablet Take 10 mg by mouth daily.    Marland Kitchen aspirin 81 MG chewable tablet Chew 1 tablet (81 mg total) by mouth  daily. 1 tablet   . carvedilol (COREG) 12.5 MG tablet TAKE 1 TABLET (12.5 MG TOTAL) BY MOUTH 2 (TWO) TIMES DAILY. 180 tablet 1  . doxazosin (CARDURA) 4 MG tablet Take 4 mg by mouth at bedtime.     . furosemide (LASIX) 20 MG tablet Take 40 mg by mouth.     . isosorbide-hydrALAZINE (BIDIL) 20-37.5 MG tablet Take 2 tablets by mouth 3 (three) times daily.     No current facility-administered medications for this visit.     Allergies:   Patient has no known allergies.    Social History:  The patient  reports that he has never smoked. He has never used smokeless tobacco. He reports current alcohol use of about 5.0 standard drinks of alcohol per week. He reports that he does not use drugs.   Family History:  The patient's family history includes Cataracts in his father; Hypertension in his mother and another family member.    ROS:  Please see the history of present illness.   Otherwise, review of systems are positive for none.   All other systems are reviewed and negative.    PHYSICAL EXAM: VS:  BP 130/74   Pulse 69   Ht 5\' 8"  (1.727 m)   Wt 229 lb 12.8 oz (104.2 kg)   BMI 34.94 kg/m  , BMI Body mass index is 34.94 kg/m. GENERAL:  Well appearing HEENT: Pupils equal round and reactive, fundi not visualized, oral mucosa unremarkable NECK:  No jugular venous distention, waveform within normal limits, carotid upstroke brisk and symmetric, no bruits LUNGS:  Clear to auscultation bilaterally HEART:  RRR.  PMI not displaced or sustained,S1 and S2 within normal limits, no S3, no S4, no clicks, no rubs, no murmurs ABD:  Flat, positive bowel sounds normal in frequency in pitch, no bruits, no rebound, no guarding, no midline pulsatile mass, no hepatomegaly, no splenomegaly EXT:  2 plus pulses throughout, no edema, no cyanosis no clubbing SKIN:  No rashes no nodules NEURO:  Cranial nerves II through XII grossly intact, motor grossly intact throughout PSYCH:  Cognitively intact, oriented to person  place and time   EKG:  EKG is ordered today. The ekg ordered today demonstrates sinus bradycardia.  Rate 49 bpm.   02/16/2018: Sinus bradycardia.  Frequent PVCs. 05/19/18: Sinus rhythm.  Rate 61 bpm.  Echo 04/23/17: Study Conclusions  - Left ventricle: The cavity size was normal. There was mild   concentric hypertrophy. Systolic function was normal. The   estimated ejection fraction was in the range of 55% to 60%. Wall   motion was normal; there  were no regional wall motion   abnormalities. Features are consistent with a pseudonormal left   ventricular filling pattern, with concomitant abnormal relaxation   and increased filling pressure (grade 2 diastolic dysfunction). - Mitral valve: There was mild regurgitation. - Left atrium: The atrium was mildly dilated.   Lexiscan Myoview 01/2017:  Normal perfusion.  Nuclear stress EF: 48%. Visual estimate, LVEF appears greater Recomm echo to confirm LVEF  This is a low risk study.   Recent Labs: 07/27/2017: ALT 18; B Natriuretic Peptide 31.2 02/16/2018: Magnesium 2.2; TSH 2.130 05/14/2018: BUN 58; Creatinine, Ser 4.22; Hemoglobin 10.5; Platelets 208; Potassium 4.0; Sodium 140    Lipid Panel No results found for: CHOL, TRIG, HDL, CHOLHDL, VLDL, LDLCALC, LDLDIRECT   09/19/2017: Hemoglobin A1c 6.5%  11/19/2017: Potassium 3.7, creatinine 4.01 Hemoglobin 6.9, platelets 238  02/05/18:  Sodium 138, potassium 3.7, BUN 75, creatinine 3.96 AST 13, ALT 11  03/12/2018: Sodium 139, potassium 3.8, BUN 58, creatinine 3.52 AST 14, ALT 14 WBC 5.2, hemoglobin 9.5, hematocrit 29.1, platelets 223  Wt Readings from Last 3 Encounters:  05/19/18 229 lb 12.8 oz (104.2 kg)  04/06/18 231 lb 14.4 oz (105.2 kg)  03/09/18 235 lb (106.6 kg)      ASSESSMENT AND PLAN:  # Hypertension: BP is well controlled.  Continue amlodipine, carvedilol, doxazosin, and Bidil.  Check fasting lipids.   # Chronic diastolic heart failure: [name] is  euvolemic.  His LE edema is likely related to venous stasis.  Recommend compression socks.   # PVCs:  Resolved.  Occurred in the setting of hypokalemia.  # Leg numbness:  Peripheral pulses are diminished. ABIs normal 01/2018.   Current medicines are reviewed at length with the patient today.  The patient does not have concerns regarding medicines.  The following changes have been made:  no change  Labs/ tests ordered today include:   Orders Placed This Encounter  Procedures  . Lipid panel  . EKG 12-Lead     Disposition:   FU with Liann Spaeth C. Oval Linsey, MD, [facility] in 1 year.     Signed, Taha Dimond C. Oval Linsey, MD, [facility]  05/19/2018 9:29 AM    [place]

## 2018-06-02 ENCOUNTER — Encounter (HOSPITAL_COMMUNITY): Payer: Self-pay

## 2018-06-10 ENCOUNTER — Other Ambulatory Visit (HOSPITAL_COMMUNITY): Payer: Self-pay | Admitting: *Deleted

## 2018-06-11 ENCOUNTER — Ambulatory Visit (HOSPITAL_COMMUNITY)
Admission: RE | Admit: 2018-06-11 | Discharge: 2018-06-11 | Disposition: A | Discharge status: Home / Self Care | Payer: 59 | Source: Ambulatory Visit | Attending: Nephrology | Admitting: Nephrology

## 2018-06-11 DIAGNOSIS — D631 Anemia in chronic kidney disease: Secondary | ICD-10-CM | POA: Insufficient documentation

## 2018-06-11 DIAGNOSIS — N189 Chronic kidney disease, unspecified: Secondary | ICD-10-CM | POA: Insufficient documentation

## 2018-06-11 LAB — CBC WITH DIFFERENTIAL/PLATELET
Abs Immature Granulocytes: 0.01 10*3/uL (ref 0.00–0.07)
BASOS ABS: 0.1 10*3/uL (ref 0.0–0.1)
BASOS PCT: 2 %
EOS ABS: 0.2 10*3/uL (ref 0.0–0.5)
Eosinophils Relative: 4 %
HCT: 33.4 % — ABNORMAL LOW (ref 39.0–52.0)
Hemoglobin: 10.3 g/dL — ABNORMAL LOW (ref 13.0–17.0)
IMMATURE GRANULOCYTES: 0 %
LYMPHS ABS: 1.1 10*3/uL (ref 0.7–4.0)
Lymphocytes Relative: 23 %
MCH: 27.9 pg (ref 26.0–34.0)
MCHC: 30.8 g/dL (ref 30.0–36.0)
MCV: 90.5 fL (ref 80.0–100.0)
Monocytes Absolute: 0.5 10*3/uL (ref 0.1–1.0)
Monocytes Relative: 11 %
NEUTROS PCT: 60 %
NRBC: 0 % (ref 0.0–0.2)
Neutro Abs: 2.9 10*3/uL (ref 1.7–7.7)
PLATELETS: 187 10*3/uL (ref 150–400)
RBC: 3.69 MIL/uL — ABNORMAL LOW (ref 4.22–5.81)
RDW: 14.8 % (ref 11.5–15.5)
WBC: 4.8 10*3/uL (ref 4.0–10.5)

## 2018-06-11 LAB — RENAL FUNCTION PANEL
ALBUMIN: 3.3 g/dL — AB (ref 3.5–5.0)
Anion gap: 10 (ref 5–15)
BUN: 61 mg/dL — AB (ref 6–20)
CO2: 24 mmol/L (ref 22–32)
CREATININE: 4.24 mg/dL — AB (ref 0.61–1.24)
Calcium: 8.7 mg/dL — ABNORMAL LOW (ref 8.9–10.3)
Chloride: 106 mmol/L (ref 98–111)
GFR calc Af Amer: 17 mL/min — ABNORMAL LOW (ref >60)
GFR, EST NON AFRICAN AMERICAN: 15 mL/min — AB (ref >60)
Glucose, Bld: 128 mg/dL — ABNORMAL HIGH (ref 70–99)
PHOSPHORUS: 4.5 mg/dL (ref 2.5–4.6)
Potassium: 4.2 mmol/L (ref 3.5–5.1)
SODIUM: 140 mmol/L (ref 135–145)

## 2018-06-11 LAB — POCT HEMOGLOBIN-HEMACUE: HEMOGLOBIN: 10.9 g/dL — AB (ref 13.0–17.0)

## 2018-06-11 MED ORDER — DARBEPOETIN ALFA 60 MCG/0.3ML IJ SOSY
PREFILLED_SYRINGE | INTRAMUSCULAR | Status: AC
  Filled 2018-06-11: qty 0.3

## 2018-06-11 MED ORDER — DARBEPOETIN ALFA 300 MCG/0.6ML IJ SOSY
60.0000 ug | PREFILLED_SYRINGE | INTRAMUSCULAR | Status: DC
  Administered 2018-06-11: 60 ug via SUBCUTANEOUS

## 2018-06-14 MED FILL — Darbepoetin Alfa Soln Prefilled Syringe 60 MCG/0.3ML: INTRAMUSCULAR | Qty: 0.3 | Status: AC

## 2018-06-23 ENCOUNTER — Other Ambulatory Visit: Payer: Self-pay | Admitting: Cardiovascular Disease

## 2018-07-06 ENCOUNTER — Encounter: Payer: 59 | Attending: General Surgery | Admitting: Registered"

## 2018-07-06 ENCOUNTER — Encounter: Payer: Self-pay | Admitting: Registered"

## 2018-07-06 DIAGNOSIS — Z9884 Bariatric surgery status: Secondary | ICD-10-CM | POA: Insufficient documentation

## 2018-07-06 DIAGNOSIS — Z713 Dietary counseling and surveillance: Secondary | ICD-10-CM | POA: Diagnosis not present

## 2018-07-06 DIAGNOSIS — E119 Type 2 diabetes mellitus without complications: Secondary | ICD-10-CM

## 2018-07-06 NOTE — Progress Notes (Signed)
Follow-up visit: 12 Months Post-Operative RYGB Surgery  Medical Nutrition Therapy:  Appt start time: 11:50 end time: 12:10  Primary concerns today: Post-operative Bariatric Surgery Nutrition Management.  Non scale victories: increased mobility, reduced diabetes medications, more energy, wearing smaller clothes, more room in booths when eating out improved knee and ankle pain  Surgery date: 07/07/2017 Surgery type: RYGB Start weight at [facility]: 338.2 Weight today: 222.1 lbs Weight change: 9.8 lbs loss from 231.9 (04/06/2018) Total weight lost: 116.1 lbs Weight loss goal: increase energy, improve knee and ankle pain, reduce medications   TANITA  BODY COMP RESULTS  07/21/2017 09/08/2017 09/22/2017 01/05/2018   BMI (kg/m^2) Pt declined Pt used regular scale Pt used regular scale Pt used regular scale   Fat Mass (lbs)       Fat Free Mass (lbs)       Total Body Water (lbs)        Pt arrives stating he feels great. Pt reports attending support group on Friday's. Pt reports listening to his body for satisfaction and not fullness. Pt states he knows to eat to nourish his body and not necessarily waiting until he feels hunger. Reports having a sugar-free soda every now and then, tolerates well. Pt reports FBS (88-108) most days; does not recall recent A1c.   Pt states he has bowel movements every 2-3 days. Pt states he attends [employer] and bikes 30 min prior to class. Pt is doing well with behavioral changes.   Pt states he does not like greek yogurt.  Preferred Learning Style:   No preference indicated   Learning Readiness:   Ready  Change in progress  24-hr recall:  B (AM): egg (6g) + steak (14g)  Snk (AM): sometimes pork rinds or nuts (7-8g)  L (2 PM): chicken wings (21-28g) + green bean casserole  Snk (PM): orange D (8 PM): chicken wings (21-28g) + green bean casserole  Snk (PM): none  Fluid intake: water sometimes with flavor pack (1.5-2 times of 64 ounce container); 100+  ounces Estimated total protein intake: 80+ grams  Medications: See list; takes glimipiride Supplementation: ProCare Health capsules + 3 calcium   CBG monitoring: yes, daily Average CBG per patient:  FBS 88-108 Last patient reported A1c: 9.6 (11/19/2017)  Using straws: no Drinking while eating: no Having you been chewing well: yes Chewing/swallowing difficulties: no Changes in vision: no Changes to mood/headaches: no Hair loss/Changes to skin/Changes to nails: no, no, no Any difficulty focusing or concentrating: no Sweating: no Dizziness/Lightheaded: no Palpitations: no  Carbonated beverages: no N/V/D/C/GAS: no, no, no, no, no Abdominal Pain: no Dumping syndrome: no Last Lap-Band fill: N/A  Recent physical activity:  [employer] 60 min, 3x/week + biking 30 min, 3x/week  Progress Towards Goal(s):  In progress.  Handouts given during visit include:  Phase VII: Maintenance Phase   Nutritional Diagnosis:  [place]-3.3 Overweight/obesity related to past poor dietary habits and physical inactivity as evidenced by patient w/ recent RYGB surgery following dietary guidelines for continued weight loss.     Intervention:  Nutrition education and counseling. Pt was educated and counseled on the next phase of the diet as well as having balanced snacks. Pt was encouraged to keep up the great work.  Goals:  [facility] for annual check-up appt with surgeon [phone].   Add protein to fruit for snacks.   Keep up the great work!  Teaching Method Utilized:  Visual Auditory Hands on  Barriers to learning/adherence to lifestyle change: none identified  Demonstrated  degree of understanding via:  Teach Back   Monitoring/Evaluation:  Dietary intake, exercise, lap band fills, and body weight. Follow up in 6 months for 18 month post-op visit.

## 2018-07-06 NOTE — Patient Instructions (Addendum)
-   [facility] for annual check-up appt with surgeon [phone].   - Add protein to fruit for snacks.   - Keep up the great work!

## 2018-07-09 ENCOUNTER — Encounter (HOSPITAL_COMMUNITY)
Admission: RE | Admit: 2018-07-09 | Discharge: 2018-07-09 | Disposition: A | Discharge status: Home / Self Care | Payer: 59 | Source: Ambulatory Visit | Attending: Nephrology | Admitting: Nephrology

## 2018-07-09 ENCOUNTER — Other Ambulatory Visit: Payer: Self-pay

## 2018-07-09 DIAGNOSIS — Z9884 Bariatric surgery status: Secondary | ICD-10-CM | POA: Insufficient documentation

## 2018-07-09 LAB — CBC WITH DIFFERENTIAL/PLATELET
ABS IMMATURE GRANULOCYTES: 0.01 10*3/uL (ref 0.00–0.07)
Basophils Absolute: 0.1 10*3/uL (ref 0.0–0.1)
Basophils Relative: 2 %
Eosinophils Absolute: 0.3 10*3/uL (ref 0.0–0.5)
Eosinophils Relative: 5 %
HCT: 33.7 % — ABNORMAL LOW (ref 39.0–52.0)
HEMOGLOBIN: 10.2 g/dL — AB (ref 13.0–17.0)
Immature Granulocytes: 0 %
Lymphocytes Relative: 21 %
Lymphs Abs: 1.1 10*3/uL (ref 0.7–4.0)
MCH: 28.1 pg (ref 26.0–34.0)
MCHC: 30.3 g/dL (ref 30.0–36.0)
MCV: 92.8 fL (ref 80.0–100.0)
MONOS PCT: 12 %
Monocytes Absolute: 0.6 10*3/uL (ref 0.1–1.0)
Neutro Abs: 3 10*3/uL (ref 1.7–7.7)
Neutrophils Relative %: 60 %
Platelets: 178 10*3/uL (ref 150–400)
RBC: 3.63 MIL/uL — ABNORMAL LOW (ref 4.22–5.81)
RDW: 14.9 % (ref 11.5–15.5)
WBC: 5 10*3/uL (ref 4.0–10.5)
nRBC: 0 % (ref 0.0–0.2)

## 2018-07-09 LAB — RENAL FUNCTION PANEL
Albumin: 3 g/dL — ABNORMAL LOW (ref 3.5–5.0)
Anion gap: 8 (ref 5–15)
BUN: 57 mg/dL — ABNORMAL HIGH (ref 6–20)
CALCIUM: 8.5 mg/dL — AB (ref 8.9–10.3)
CO2: 23 mmol/L (ref 22–32)
Chloride: 109 mmol/L (ref 98–111)
Creatinine, Ser: 4.47 mg/dL — ABNORMAL HIGH (ref 0.61–1.24)
GFR calc Af Amer: 16 mL/min — ABNORMAL LOW (ref >60)
GFR, EST NON AFRICAN AMERICAN: 14 mL/min — AB (ref >60)
Glucose, Bld: 54 mg/dL — ABNORMAL LOW (ref 70–99)
Phosphorus: 4.8 mg/dL — ABNORMAL HIGH (ref 2.5–4.6)
Potassium: 3.9 mmol/L (ref 3.5–5.1)
SODIUM: 140 mmol/L (ref 135–145)

## 2018-07-09 LAB — IRON AND TIBC
IRON: 54 ug/dL (ref 45–182)
Saturation Ratios: 23 % (ref 17.9–39.5)
TIBC: 238 ug/dL — ABNORMAL LOW (ref 250–450)
UIBC: 184 ug/dL

## 2018-07-09 LAB — POCT HEMOGLOBIN-HEMACUE: Hemoglobin: 10.3 g/dL — ABNORMAL LOW (ref 13.0–17.0)

## 2018-07-09 LAB — FERRITIN: Ferritin: 425 ng/mL — ABNORMAL HIGH (ref 24–336)

## 2018-07-09 MED ORDER — DARBEPOETIN ALFA 60 MCG/0.3ML IJ SOSY
PREFILLED_SYRINGE | INTRAMUSCULAR | Status: AC
  Filled 2018-07-09: qty 0.3

## 2018-07-09 MED ORDER — DARBEPOETIN ALFA 300 MCG/0.6ML IJ SOSY
60.0000 ug | PREFILLED_SYRINGE | INTRAMUSCULAR | Status: DC
  Administered 2018-07-09: 60 ug via SUBCUTANEOUS

## 2018-08-05 ENCOUNTER — Other Ambulatory Visit: Payer: Self-pay

## 2018-08-05 ENCOUNTER — Other Ambulatory Visit (HOSPITAL_COMMUNITY): Payer: Self-pay | Admitting: *Deleted

## 2018-08-06 ENCOUNTER — Ambulatory Visit (HOSPITAL_COMMUNITY)
Admission: RE | Admit: 2018-08-06 | Discharge: 2018-08-06 | Disposition: A | Discharge status: Home / Self Care | Payer: 59 | Source: Ambulatory Visit | Attending: Nephrology | Admitting: Nephrology

## 2018-08-06 DIAGNOSIS — D631 Anemia in chronic kidney disease: Secondary | ICD-10-CM | POA: Insufficient documentation

## 2018-08-06 LAB — CBC WITH DIFFERENTIAL/PLATELET
Abs Immature Granulocytes: 0.02 10*3/uL (ref 0.00–0.07)
Basophils Absolute: 0.1 10*3/uL (ref 0.0–0.1)
Basophils Relative: 2 %
Eosinophils Absolute: 0.2 10*3/uL (ref 0.0–0.5)
Eosinophils Relative: 5 %
HCT: 34.1 % — ABNORMAL LOW (ref 39.0–52.0)
Hemoglobin: 11 g/dL — ABNORMAL LOW (ref 13.0–17.0)
Immature Granulocytes: 1 %
Lymphocytes Relative: 35 %
Lymphs Abs: 1.2 10*3/uL (ref 0.7–4.0)
MCH: 29.7 pg (ref 26.0–34.0)
MCHC: 32.3 g/dL (ref 30.0–36.0)
MCV: 92.2 fL (ref 80.0–100.0)
Monocytes Absolute: 0.5 10*3/uL (ref 0.1–1.0)
Monocytes Relative: 14 %
Neutro Abs: 1.5 10*3/uL — ABNORMAL LOW (ref 1.7–7.7)
Neutrophils Relative %: 43 %
Platelets: 179 10*3/uL (ref 150–400)
RBC: 3.7 MIL/uL — ABNORMAL LOW (ref 4.22–5.81)
RDW: 14.2 % (ref 11.5–15.5)
WBC: 3.4 10*3/uL — ABNORMAL LOW (ref 4.0–10.5)
nRBC: 0 % (ref 0.0–0.2)

## 2018-08-06 LAB — IRON AND TIBC
Iron: 82 ug/dL (ref 45–182)
Saturation Ratios: 31 % (ref 17.9–39.5)
TIBC: 269 ug/dL (ref 250–450)
UIBC: 187 ug/dL

## 2018-08-06 LAB — RENAL FUNCTION PANEL
Albumin: 3.3 g/dL — ABNORMAL LOW (ref 3.5–5.0)
Anion gap: 11 (ref 5–15)
BUN: 65 mg/dL — ABNORMAL HIGH (ref 6–20)
CO2: 23 mmol/L (ref 22–32)
Calcium: 8.7 mg/dL — ABNORMAL LOW (ref 8.9–10.3)
Chloride: 107 mmol/L (ref 98–111)
Creatinine, Ser: 4.99 mg/dL — ABNORMAL HIGH (ref 0.61–1.24)
GFR calc Af Amer: 14 mL/min — ABNORMAL LOW (ref >60)
GFR calc non Af Amer: 12 mL/min — ABNORMAL LOW (ref >60)
Glucose, Bld: 103 mg/dL — ABNORMAL HIGH (ref 70–99)
Phosphorus: 5.7 mg/dL — ABNORMAL HIGH (ref 2.5–4.6)
Potassium: 4.1 mmol/L (ref 3.5–5.1)
Sodium: 141 mmol/L (ref 135–145)

## 2018-08-06 LAB — POCT HEMOGLOBIN-HEMACUE: Hemoglobin: 11.1 g/dL — ABNORMAL LOW (ref 13.0–17.0)

## 2018-08-06 LAB — FERRITIN: Ferritin: 446 ng/mL — ABNORMAL HIGH (ref 24–336)

## 2018-08-06 MED ORDER — DARBEPOETIN ALFA 300 MCG/0.6ML IJ SOSY
60.0000 ug | PREFILLED_SYRINGE | INTRAMUSCULAR | Status: DC
  Administered 2018-08-06: 60 ug via SUBCUTANEOUS

## 2018-08-06 MED ORDER — DARBEPOETIN ALFA 60 MCG/0.3ML IJ SOSY
PREFILLED_SYRINGE | INTRAMUSCULAR | Status: AC
  Filled 2018-08-06: qty 0.3

## 2018-08-11 MED FILL — Darbepoetin Alfa Soln Prefilled Syringe 60 MCG/0.3ML: INTRAMUSCULAR | Qty: 0.3 | Status: AC

## 2018-09-02 ENCOUNTER — Other Ambulatory Visit (HOSPITAL_COMMUNITY): Payer: Self-pay | Admitting: *Deleted

## 2018-09-03 ENCOUNTER — Other Ambulatory Visit: Payer: Self-pay

## 2018-09-03 ENCOUNTER — Other Ambulatory Visit (HOSPITAL_COMMUNITY): Payer: Self-pay | Admitting: *Deleted

## 2018-09-03 ENCOUNTER — Ambulatory Visit (HOSPITAL_COMMUNITY)
Admission: RE | Admit: 2018-09-03 | Discharge: 2018-09-03 | Disposition: A | Discharge status: Home / Self Care | Payer: 59 | Source: Ambulatory Visit | Attending: Nephrology | Admitting: Nephrology

## 2018-09-03 DIAGNOSIS — D631 Anemia in chronic kidney disease: Secondary | ICD-10-CM | POA: Diagnosis present

## 2018-09-03 LAB — RENAL FUNCTION PANEL
Albumin: 3.2 g/dL — ABNORMAL LOW (ref 3.5–5.0)
Anion gap: 12 (ref 5–15)
BUN: 77 mg/dL — ABNORMAL HIGH (ref 6–20)
CO2: 22 mmol/L (ref 22–32)
Calcium: 8.4 mg/dL — ABNORMAL LOW (ref 8.9–10.3)
Chloride: 105 mmol/L (ref 98–111)
Creatinine, Ser: 5.61 mg/dL — ABNORMAL HIGH (ref 0.61–1.24)
GFR calc Af Amer: 12 mL/min — ABNORMAL LOW (ref >60)
GFR calc non Af Amer: 11 mL/min — ABNORMAL LOW (ref >60)
Glucose, Bld: 93 mg/dL (ref 70–99)
Phosphorus: 5.9 mg/dL — ABNORMAL HIGH (ref 2.5–4.6)
Potassium: 3.8 mmol/L (ref 3.5–5.1)
Sodium: 139 mmol/L (ref 135–145)

## 2018-09-03 LAB — CBC WITH DIFFERENTIAL/PLATELET
Abs Immature Granulocytes: 0 10*3/uL (ref 0.00–0.07)
Basophils Absolute: 0.1 10*3/uL (ref 0.0–0.1)
Basophils Relative: 3 %
Eosinophils Absolute: 0.3 10*3/uL (ref 0.0–0.5)
Eosinophils Relative: 8 %
HCT: 32.9 % — ABNORMAL LOW (ref 39.0–52.0)
Hemoglobin: 10.5 g/dL — ABNORMAL LOW (ref 13.0–17.0)
Immature Granulocytes: 0 %
Lymphocytes Relative: 38 %
Lymphs Abs: 1.3 10*3/uL (ref 0.7–4.0)
MCH: 29.3 pg (ref 26.0–34.0)
MCHC: 31.9 g/dL (ref 30.0–36.0)
MCV: 91.9 fL (ref 80.0–100.0)
Monocytes Absolute: 0.5 10*3/uL (ref 0.1–1.0)
Monocytes Relative: 15 %
Neutro Abs: 1.3 10*3/uL — ABNORMAL LOW (ref 1.7–7.7)
Neutrophils Relative %: 36 %
Platelets: 153 10*3/uL (ref 150–400)
RBC: 3.58 MIL/uL — ABNORMAL LOW (ref 4.22–5.81)
RDW: 13.4 % (ref 11.5–15.5)
WBC: 3.4 10*3/uL — ABNORMAL LOW (ref 4.0–10.5)
nRBC: 0 % (ref 0.0–0.2)

## 2018-09-03 MED ORDER — DARBEPOETIN ALFA 60 MCG/0.3ML IJ SOSY
60.0000 ug | PREFILLED_SYRINGE | INTRAMUSCULAR | Status: AC
  Administered 2018-09-03: 60 ug via SUBCUTANEOUS

## 2018-09-03 MED ORDER — DARBEPOETIN ALFA 60 MCG/0.3ML IJ SOSY
PREFILLED_SYRINGE | INTRAMUSCULAR | Status: AC
  Filled 2018-09-03: qty 0.3

## 2018-09-06 LAB — POCT HEMOGLOBIN-HEMACUE: Hemoglobin: 10.4 g/dL — ABNORMAL LOW (ref 13.0–17.0)

## 2018-09-30 ENCOUNTER — Other Ambulatory Visit (HOSPITAL_COMMUNITY): Payer: Self-pay | Admitting: *Deleted

## 2018-10-01 ENCOUNTER — Other Ambulatory Visit: Payer: Self-pay

## 2018-10-01 ENCOUNTER — Ambulatory Visit (HOSPITAL_COMMUNITY)
Admission: RE | Admit: 2018-10-01 | Discharge: 2018-10-01 | Disposition: A | Discharge status: Home / Self Care | Payer: 59 | Source: Ambulatory Visit | Attending: Nephrology | Admitting: Nephrology

## 2018-10-01 DIAGNOSIS — D631 Anemia in chronic kidney disease: Secondary | ICD-10-CM | POA: Insufficient documentation

## 2018-10-01 DIAGNOSIS — N184 Chronic kidney disease, stage 4 (severe): Secondary | ICD-10-CM | POA: Diagnosis present

## 2018-10-01 LAB — RENAL FUNCTION PANEL
Albumin: 3.2 g/dL — ABNORMAL LOW (ref 3.5–5.0)
Anion gap: 12 (ref 5–15)
BUN: 74 mg/dL — ABNORMAL HIGH (ref 6–20)
CO2: 20 mmol/L — ABNORMAL LOW (ref 22–32)
Calcium: 8.5 mg/dL — ABNORMAL LOW (ref 8.9–10.3)
Chloride: 106 mmol/L (ref 98–111)
Creatinine, Ser: 5.56 mg/dL — ABNORMAL HIGH (ref 0.61–1.24)
GFR calc Af Amer: 12 mL/min — ABNORMAL LOW (ref >60)
GFR calc non Af Amer: 11 mL/min — ABNORMAL LOW (ref >60)
Glucose, Bld: 102 mg/dL — ABNORMAL HIGH (ref 70–99)
Phosphorus: 5.1 mg/dL — ABNORMAL HIGH (ref 2.5–4.6)
Potassium: 4 mmol/L (ref 3.5–5.1)
Sodium: 138 mmol/L (ref 135–145)

## 2018-10-01 LAB — CBC WITH DIFFERENTIAL/PLATELET
Abs Immature Granulocytes: 0.01 10*3/uL (ref 0.00–0.07)
Basophils Absolute: 0.1 10*3/uL (ref 0.0–0.1)
Basophils Relative: 3 %
Eosinophils Absolute: 0.3 10*3/uL (ref 0.0–0.5)
Eosinophils Relative: 9 %
HCT: 32.1 % — ABNORMAL LOW (ref 39.0–52.0)
Hemoglobin: 10.4 g/dL — ABNORMAL LOW (ref 13.0–17.0)
Immature Granulocytes: 0 %
Lymphocytes Relative: 34 %
Lymphs Abs: 1.1 10*3/uL (ref 0.7–4.0)
MCH: 29.5 pg (ref 26.0–34.0)
MCHC: 32.4 g/dL (ref 30.0–36.0)
MCV: 91.2 fL (ref 80.0–100.0)
Monocytes Absolute: 0.5 10*3/uL (ref 0.1–1.0)
Monocytes Relative: 15 %
Neutro Abs: 1.2 10*3/uL — ABNORMAL LOW (ref 1.7–7.7)
Neutrophils Relative %: 39 %
Platelets: 148 10*3/uL — ABNORMAL LOW (ref 150–400)
RBC: 3.52 MIL/uL — ABNORMAL LOW (ref 4.22–5.81)
RDW: 13.4 % (ref 11.5–15.5)
WBC: 3.2 10*3/uL — ABNORMAL LOW (ref 4.0–10.5)
nRBC: 0 % (ref 0.0–0.2)

## 2018-10-01 LAB — IRON AND TIBC
Iron: 83 ug/dL (ref 45–182)
Saturation Ratios: 34 % (ref 17.9–39.5)
TIBC: 241 ug/dL — ABNORMAL LOW (ref 250–450)
UIBC: 158 ug/dL

## 2018-10-01 LAB — POCT HEMOGLOBIN-HEMACUE: Hemoglobin: 10.7 g/dL — ABNORMAL LOW (ref 13.0–17.0)

## 2018-10-01 LAB — FERRITIN: Ferritin: 383 ng/mL — ABNORMAL HIGH (ref 24–336)

## 2018-10-01 MED ORDER — DARBEPOETIN ALFA 60 MCG/0.3ML IJ SOSY
PREFILLED_SYRINGE | INTRAMUSCULAR | Status: AC
  Administered 2018-10-01: 60 ug
  Filled 2018-10-01: qty 0.3

## 2018-10-01 MED ORDER — DARBEPOETIN ALFA 300 MCG/0.6ML IJ SOSY: 60.0000 ug | PREFILLED_SYRINGE | INTRAMUSCULAR | Status: DC

## 2018-10-27 ENCOUNTER — Other Ambulatory Visit (HOSPITAL_COMMUNITY): Payer: Self-pay | Admitting: *Deleted

## 2018-10-28 ENCOUNTER — Encounter (HOSPITAL_COMMUNITY)
Admission: RE | Admit: 2018-10-28 | Discharge: 2018-10-28 | Disposition: A | Discharge status: Home / Self Care | Payer: 59 | Source: Ambulatory Visit | Attending: Nephrology | Admitting: Nephrology

## 2018-10-28 ENCOUNTER — Other Ambulatory Visit: Payer: Self-pay

## 2018-10-28 DIAGNOSIS — N184 Chronic kidney disease, stage 4 (severe): Secondary | ICD-10-CM | POA: Insufficient documentation

## 2018-10-28 DIAGNOSIS — D631 Anemia in chronic kidney disease: Secondary | ICD-10-CM | POA: Insufficient documentation

## 2018-10-28 LAB — COMPREHENSIVE METABOLIC PANEL
ALT: 37 U/L (ref 0–44)
AST: 24 U/L (ref 15–41)
Albumin: 3.1 g/dL — ABNORMAL LOW (ref 3.5–5.0)
Alkaline Phosphatase: 66 U/L (ref 38–126)
Anion gap: 11 (ref 5–15)
BUN: 84 mg/dL — ABNORMAL HIGH (ref 6–20)
CO2: 20 mmol/L — ABNORMAL LOW (ref 22–32)
Calcium: 8 mg/dL — ABNORMAL LOW (ref 8.9–10.3)
Chloride: 107 mmol/L (ref 98–111)
Creatinine, Ser: 6.67 mg/dL — ABNORMAL HIGH (ref 0.61–1.24)
GFR calc Af Amer: 10 mL/min — ABNORMAL LOW (ref >60)
GFR calc non Af Amer: 9 mL/min — ABNORMAL LOW (ref >60)
Glucose, Bld: 110 mg/dL — ABNORMAL HIGH (ref 70–99)
Potassium: 4 mmol/L (ref 3.5–5.1)
Sodium: 138 mmol/L (ref 135–145)
Total Bilirubin: 0.6 mg/dL (ref 0.3–1.2)
Total Protein: 5.4 g/dL — ABNORMAL LOW (ref 6.5–8.1)

## 2018-10-28 LAB — CBC WITH DIFFERENTIAL/PLATELET
Abs Immature Granulocytes: 0.01 10*3/uL (ref 0.00–0.07)
Basophils Absolute: 0.1 10*3/uL (ref 0.0–0.1)
Basophils Relative: 3 %
Eosinophils Absolute: 0.3 10*3/uL (ref 0.0–0.5)
Eosinophils Relative: 9 %
HCT: 29.6 % — ABNORMAL LOW (ref 39.0–52.0)
Hemoglobin: 9.6 g/dL — ABNORMAL LOW (ref 13.0–17.0)
Immature Granulocytes: 0 %
Lymphocytes Relative: 26 %
Lymphs Abs: 1 10*3/uL (ref 0.7–4.0)
MCH: 29.5 pg (ref 26.0–34.0)
MCHC: 32.4 g/dL (ref 30.0–36.0)
MCV: 91.1 fL (ref 80.0–100.0)
Monocytes Absolute: 0.6 10*3/uL (ref 0.1–1.0)
Monocytes Relative: 15 %
Neutro Abs: 1.9 10*3/uL (ref 1.7–7.7)
Neutrophils Relative %: 47 %
Platelets: 158 10*3/uL (ref 150–400)
RBC: 3.25 MIL/uL — ABNORMAL LOW (ref 4.22–5.81)
RDW: 13.8 % (ref 11.5–15.5)
WBC: 4 10*3/uL (ref 4.0–10.5)
nRBC: 0 % (ref 0.0–0.2)

## 2018-10-28 LAB — PHOSPHORUS: Phosphorus: 6.8 mg/dL — ABNORMAL HIGH (ref 2.5–4.6)

## 2018-10-28 LAB — URINALYSIS, COMPLETE (UACMP) WITH MICROSCOPIC
Bilirubin Urine: NEGATIVE
Glucose, UA: NEGATIVE mg/dL
Hgb urine dipstick: NEGATIVE
Ketones, ur: NEGATIVE mg/dL
Leukocytes,Ua: NEGATIVE
Nitrite: NEGATIVE
Protein, ur: >=300 mg/dL — AB
Specific Gravity, Urine: 1.01 (ref 1.005–1.030)
pH: 5 (ref 5.0–8.0)

## 2018-10-28 LAB — PROTEIN / CREATININE RATIO, URINE
Creatinine, Urine: 82.45 mg/dL
Protein Creatinine Ratio: 2.9 mg/mg{Cre} — ABNORMAL HIGH (ref 0.00–0.15)
Total Protein, Urine: 239 mg/dL

## 2018-10-28 MED ORDER — DARBEPOETIN ALFA 60 MCG/0.3ML IJ SOSY
60.0000 ug | PREFILLED_SYRINGE | INTRAMUSCULAR | Status: DC
  Administered 2018-10-28: 60 ug via SUBCUTANEOUS

## 2018-10-28 MED ORDER — DARBEPOETIN ALFA 60 MCG/0.3ML IJ SOSY
PREFILLED_SYRINGE | INTRAMUSCULAR | Status: AC
  Filled 2018-10-28: qty 0.3

## 2018-10-29 LAB — PTH, INTACT AND CALCIUM
Calcium, Total (PTH): 7.7 mg/dL — ABNORMAL LOW (ref 8.7–10.2)
PTH: 214 pg/mL — ABNORMAL HIGH (ref 15–65)

## 2018-11-01 LAB — POCT HEMOGLOBIN-HEMACUE: Hemoglobin: 9.8 g/dL — ABNORMAL LOW (ref 13.0–17.0)

## 2018-11-25 ENCOUNTER — Other Ambulatory Visit: Payer: Self-pay

## 2018-11-25 ENCOUNTER — Ambulatory Visit (HOSPITAL_COMMUNITY)
Admission: RE | Admit: 2018-11-25 | Discharge: 2018-11-25 | Disposition: A | Discharge status: Home / Self Care | Payer: 59 | Source: Ambulatory Visit | Attending: Nephrology | Admitting: Nephrology

## 2018-11-25 DIAGNOSIS — N184 Chronic kidney disease, stage 4 (severe): Secondary | ICD-10-CM | POA: Diagnosis present

## 2018-11-25 DIAGNOSIS — D631 Anemia in chronic kidney disease: Secondary | ICD-10-CM | POA: Insufficient documentation

## 2018-11-25 LAB — POCT HEMOGLOBIN-HEMACUE: Hemoglobin: 9.6 g/dL — ABNORMAL LOW (ref 13.0–17.0)

## 2018-11-25 MED ORDER — DARBEPOETIN ALFA 60 MCG/0.3ML IJ SOSY
60.0000 ug | PREFILLED_SYRINGE | INTRAMUSCULAR | Status: DC
  Administered 2018-11-25: 60 ug via SUBCUTANEOUS

## 2018-11-25 MED ORDER — DARBEPOETIN ALFA 60 MCG/0.3ML IJ SOSY
PREFILLED_SYRINGE | INTRAMUSCULAR | Status: AC
  Filled 2018-11-25: qty 0.3

## 2018-12-04 IMAGING — RF DG UGI W/ KUB
11 of 14 series · 12 of 24 positions shown · non-contrast
Comparison: CT scan 11/19/2016

CLINICAL DATA: Preoperative for gastric bypass morbid obesity,
preoperative gastric bypass.

EXAM:
UPPER GI SERIES WITH KUB
TECHNIQUE: After obtaining a scout radiograph a routine upper GI series was
performed using thin and high density barium.
FLUOROSCOPY TIME:  Fluoroscopy Time:  2.6 minute
Radiation Exposure Index (if provided by the fluoroscopic device):
74.9 mGy
Number of Acquired Spot Images: 3 E

[Series 2: cp_standard · 0.34mm/px · 1 of 24 frames shown (1 of 10)]
[frame 13/24]
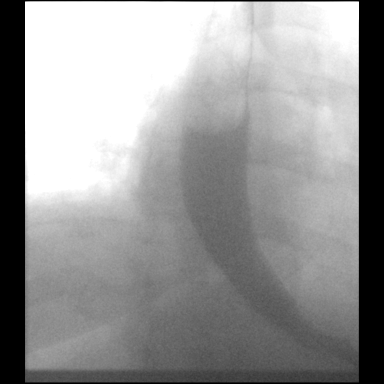

[Series 3: cp_standard · 0.34mm/px · 1 of 33 frames shown (2 of 10)]
[frame 5/33]
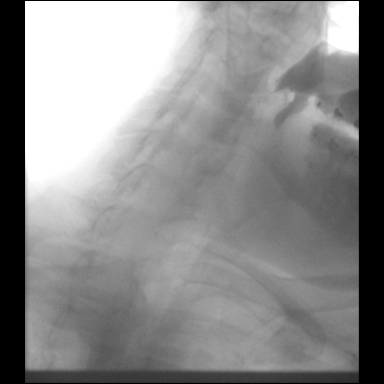

[Series 4: cp_standard · 0.35mm/px · 2 of 42 frames shown (3 of 10)]
[frame 7/42]
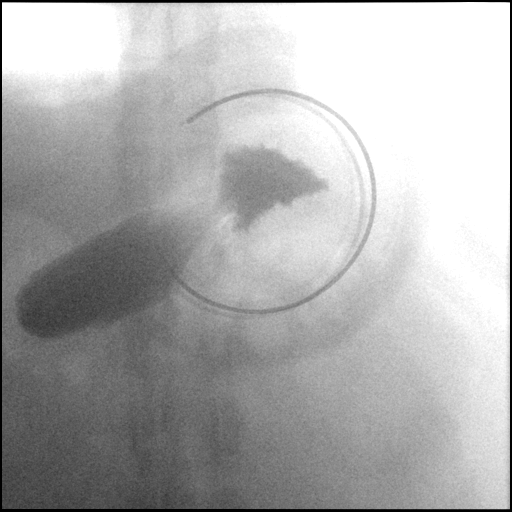
[frame 40/42]
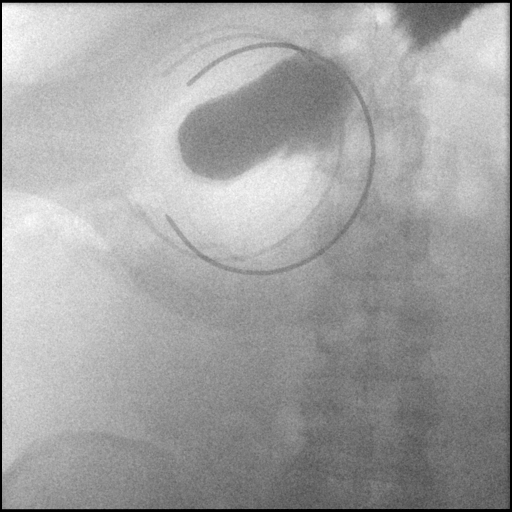

[Series 5: cp_standard · 0.36mm/px · 1 of 62 frames shown (4 of 10)]
[frame 53/62]
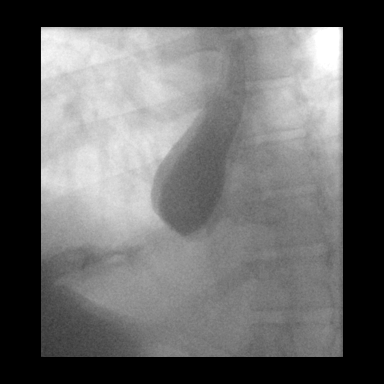

[Series 6: cp_standard · 0.36mm/px · 1 of 51 frames shown (5 of 10)]
[frame 44/51]
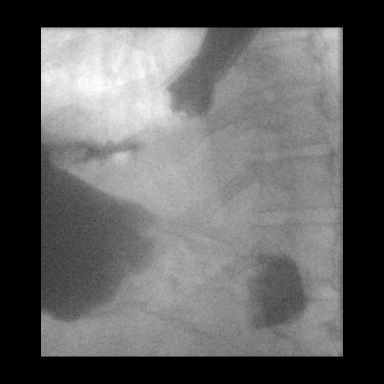

[Series 7: cp_standard · 0.36mm/px · 1 of 34 frames shown (6 of 10)]
[frame 23/34]
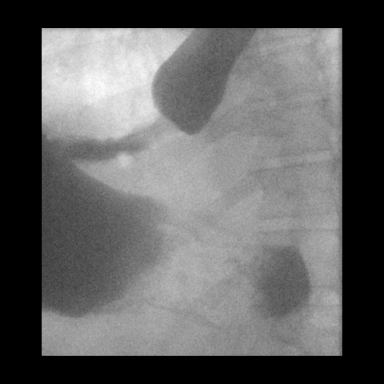

[Series 9: cp_standard · 0.34mm/px · 1 of 25 frames shown (7 of 10)]
[frame 13/25]
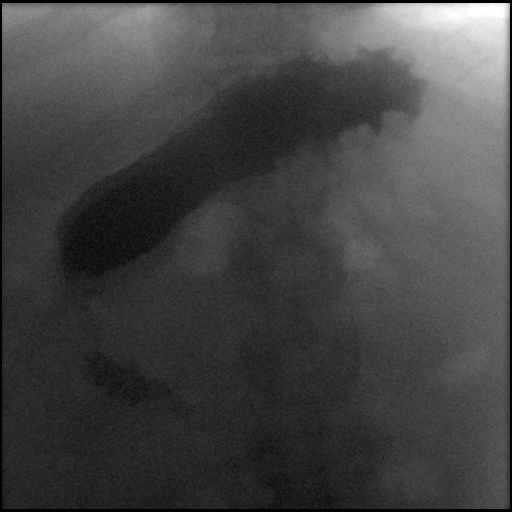

[Series 11: fluoro_barium 2fps_bw · 0.17mm/px · 1 of 1 slices shown]
[im 1/1]
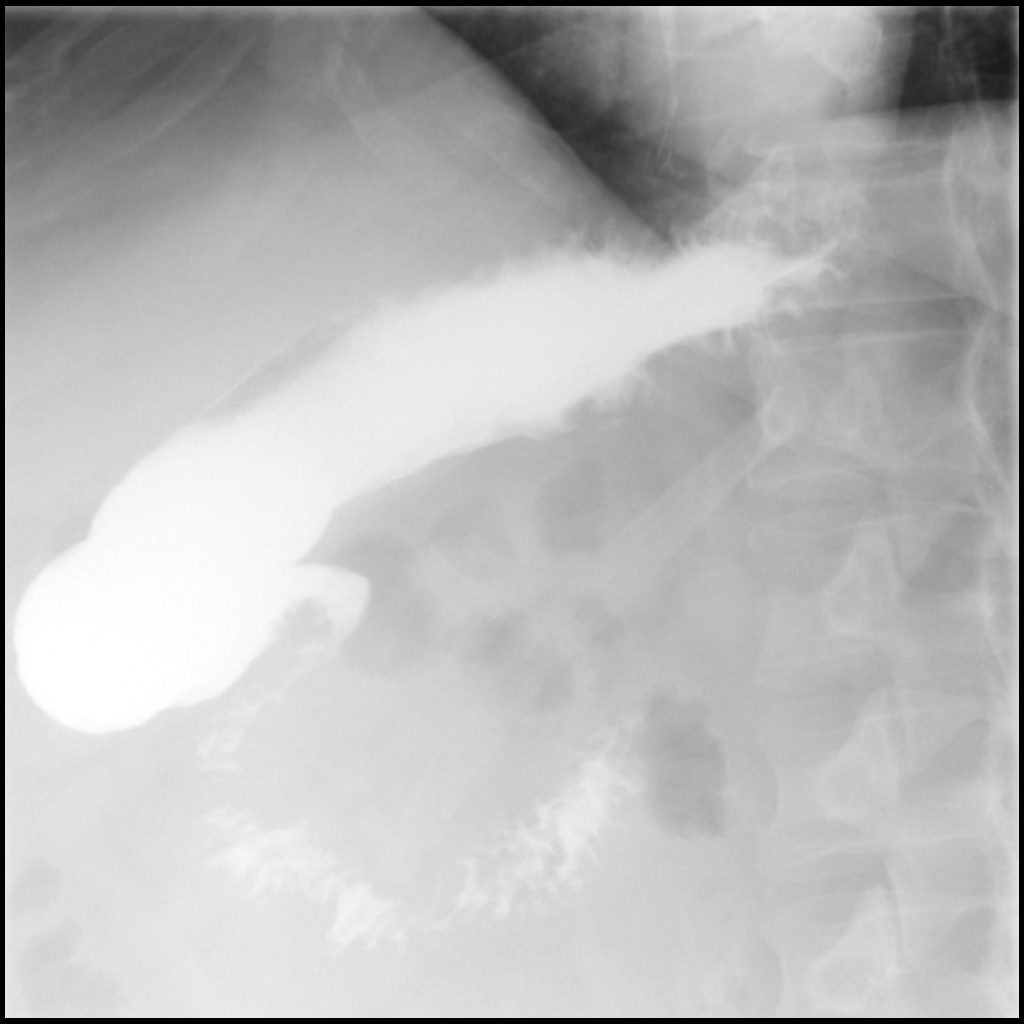

[Series 12: cp_standard · 0.34mm/px · 1 of 44 frames shown (8 of 10)]
[frame 42/44  full-range]
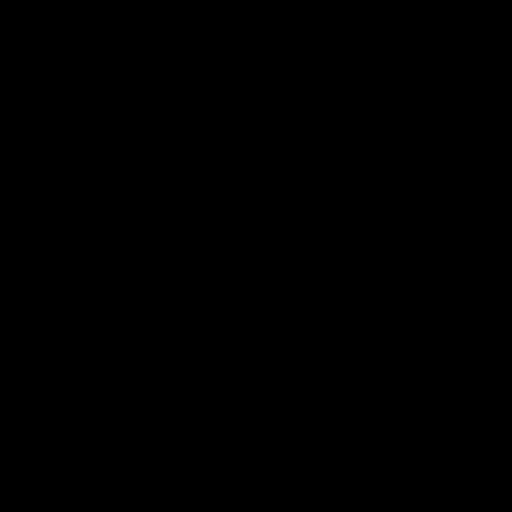

[Series 13: cp_standard · 0.34mm/px · 1 of 6 frames shown (9 of 10)]
[frame 6/6]
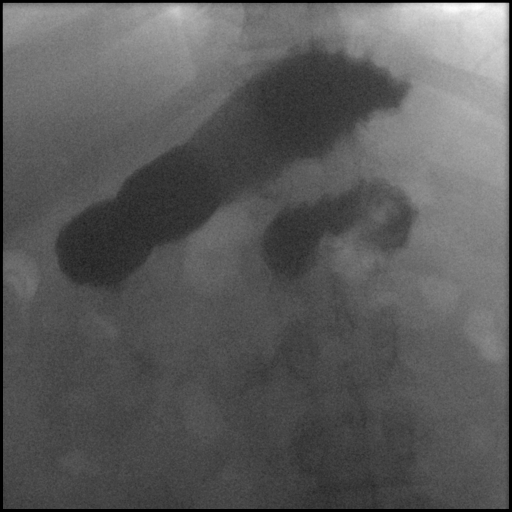

[Series 14: cp_standard · 0.36mm/px · 1 of 15 frames shown (10 of 10)]
[frame 13/15]
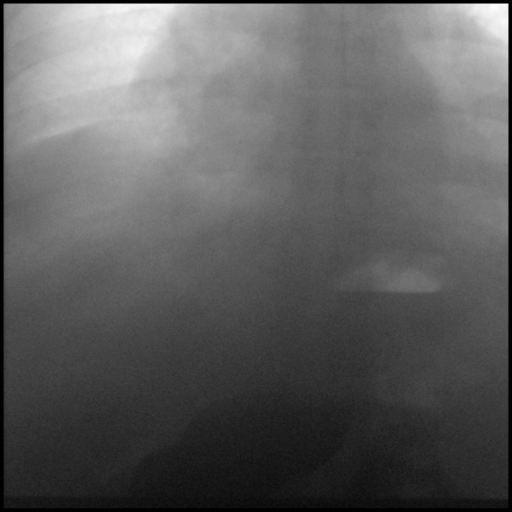

[12 of 24 positions shown; findings below may reference images not displayed]

FINDINGS: There is mild cardiomegaly on the initial KUB.

Primary peristaltic waves in the esophagus are normal on [DATE]
swallows. No esophageal stricture or overt narrowing. No hiatal
hernia seen.

A 13 mm barium tablet passed without difficulty into the stomach.

Compression mucosal relief imaging of the stomach appears
unremarkable. No specific gastric abnormality is observed. Normal
duodenal morphology with ligament of Treitz in the left upper
quadrant.

There is a single episode of mild gastroesophageal reflux.
IMPRESSION: 1. Single episode of mild gastroesophageal reflux.
2. Mild cardiomegaly.
3. Otherwise normal upper GI examination.

## 2018-12-22 ENCOUNTER — Other Ambulatory Visit (HOSPITAL_COMMUNITY): Payer: Self-pay | Admitting: *Deleted

## 2018-12-22 ENCOUNTER — Encounter: Payer: Self-pay | Admitting: Nephrology

## 2018-12-22 DIAGNOSIS — N189 Chronic kidney disease, unspecified: Secondary | ICD-10-CM

## 2018-12-22 DIAGNOSIS — Z862 Personal history of diseases of the blood and blood-forming organs and certain disorders involving the immune mechanism: Secondary | ICD-10-CM

## 2018-12-22 HISTORY — DX: Personal history of diseases of the blood and blood-forming organs and certain disorders involving the immune mechanism: Z86.2

## 2018-12-22 HISTORY — DX: Chronic kidney disease, unspecified: N18.9

## 2018-12-23 ENCOUNTER — Other Ambulatory Visit: Payer: Self-pay

## 2018-12-23 ENCOUNTER — Ambulatory Visit (HOSPITAL_COMMUNITY)
Admission: RE | Admit: 2018-12-23 | Discharge: 2018-12-23 | Disposition: A | Discharge status: Home / Self Care | Payer: 59 | Source: Ambulatory Visit | Attending: Nephrology | Admitting: Nephrology

## 2018-12-23 DIAGNOSIS — N184 Chronic kidney disease, stage 4 (severe): Secondary | ICD-10-CM | POA: Insufficient documentation

## 2018-12-23 DIAGNOSIS — D631 Anemia in chronic kidney disease: Secondary | ICD-10-CM | POA: Diagnosis not present

## 2018-12-23 LAB — COMPREHENSIVE METABOLIC PANEL
ALT: 38 U/L (ref 0–44)
AST: 30 U/L (ref 15–41)
Albumin: 3.4 g/dL — ABNORMAL LOW (ref 3.5–5.0)
Alkaline Phosphatase: 67 U/L (ref 38–126)
Anion gap: 11 (ref 5–15)
BUN: 94 mg/dL — ABNORMAL HIGH (ref 6–20)
CO2: 21 mmol/L — ABNORMAL LOW (ref 22–32)
Calcium: 8.2 mg/dL — ABNORMAL LOW (ref 8.9–10.3)
Chloride: 108 mmol/L (ref 98–111)
Creatinine, Ser: 8.03 mg/dL — ABNORMAL HIGH (ref 0.61–1.24)
GFR calc Af Amer: 8 mL/min — ABNORMAL LOW (ref >60)
GFR calc non Af Amer: 7 mL/min — ABNORMAL LOW (ref >60)
Glucose, Bld: 92 mg/dL (ref 70–99)
Potassium: 3.7 mmol/L (ref 3.5–5.1)
Sodium: 140 mmol/L (ref 135–145)
Total Bilirubin: 0.6 mg/dL (ref 0.3–1.2)
Total Protein: 5.5 g/dL — ABNORMAL LOW (ref 6.5–8.1)

## 2018-12-23 LAB — CBC WITH DIFFERENTIAL/PLATELET
Abs Immature Granulocytes: 0.01 10*3/uL (ref 0.00–0.07)
Basophils Absolute: 0.1 10*3/uL (ref 0.0–0.1)
Basophils Relative: 2 %
Eosinophils Absolute: 0.3 10*3/uL (ref 0.0–0.5)
Eosinophils Relative: 9 %
HCT: 30.4 % — ABNORMAL LOW (ref 39.0–52.0)
Hemoglobin: 9.7 g/dL — ABNORMAL LOW (ref 13.0–17.0)
Immature Granulocytes: 0 %
Lymphocytes Relative: 34 %
Lymphs Abs: 1.2 10*3/uL (ref 0.7–4.0)
MCH: 29.6 pg (ref 26.0–34.0)
MCHC: 31.9 g/dL (ref 30.0–36.0)
MCV: 92.7 fL (ref 80.0–100.0)
Monocytes Absolute: 0.4 10*3/uL (ref 0.1–1.0)
Monocytes Relative: 12 %
Neutro Abs: 1.4 10*3/uL — ABNORMAL LOW (ref 1.7–7.7)
Neutrophils Relative %: 43 %
Platelets: 147 10*3/uL — ABNORMAL LOW (ref 150–400)
RBC: 3.28 MIL/uL — ABNORMAL LOW (ref 4.22–5.81)
RDW: 13.7 % (ref 11.5–15.5)
WBC: 3.4 10*3/uL — ABNORMAL LOW (ref 4.0–10.5)
nRBC: 0 % (ref 0.0–0.2)

## 2018-12-23 LAB — IRON AND TIBC
Iron: 62 ug/dL (ref 45–182)
Saturation Ratios: 25 % (ref 17.9–39.5)
TIBC: 252 ug/dL (ref 250–450)
UIBC: 190 ug/dL

## 2018-12-23 LAB — FERRITIN: Ferritin: 376 ng/mL — ABNORMAL HIGH (ref 24–336)

## 2018-12-23 LAB — PHOSPHORUS: Phosphorus: 7.3 mg/dL — ABNORMAL HIGH (ref 2.5–4.6)

## 2018-12-23 LAB — POCT HEMOGLOBIN-HEMACUE: Hemoglobin: 9.7 g/dL — ABNORMAL LOW (ref 13.0–17.0)

## 2018-12-23 MED ORDER — DARBEPOETIN ALFA 60 MCG/0.3ML IJ SOSY
60.0000 ug | PREFILLED_SYRINGE | INTRAMUSCULAR | Status: DC
  Administered 2018-12-23: 60 ug via SUBCUTANEOUS

## 2018-12-23 MED ORDER — DARBEPOETIN ALFA 60 MCG/0.3ML IJ SOSY
PREFILLED_SYRINGE | INTRAMUSCULAR | Status: AC
  Filled 2018-12-23: qty 0.3

## 2019-01-06 ENCOUNTER — Other Ambulatory Visit: Payer: Self-pay

## 2019-01-06 ENCOUNTER — Encounter: Payer: 59 | Attending: General Surgery | Admitting: Registered"

## 2019-01-06 ENCOUNTER — Encounter: Payer: Self-pay | Admitting: Registered"

## 2019-01-06 DIAGNOSIS — E119 Type 2 diabetes mellitus without complications: Secondary | ICD-10-CM | POA: Insufficient documentation

## 2019-01-06 NOTE — Progress Notes (Signed)
Follow-up visit: 18 Months Post-Operative RYGB Surgery  Medical Nutrition Therapy:  Appt start time: 11:15 end time: 11:30  Primary concerns today: Post-operative Bariatric Surgery Nutrition Management.  Non scale victories: increased mobility, reduced diabetes medications, more energy, wearing smaller clothes, more room in booths when eating out improved knee and ankle pain  Surgery date: 07/07/2017 Surgery type: RYGB Start weight at [facility]: 338.2 Weight today: 205.3 lbs Weight change: 16.8 lbs loss from 222.1 (07/05/2017) Total weight lost: 132.9 lbs Weight loss goal: increase energy, improve knee and ankle pain, reduce medications   TANITA Results: Pt has consistently declined using TANITA scale post-surgery. Uses regular scale instead.   Pt arrives stating he feels great. Reduction in diabetes medications. Has only had 1 flare-up in last few months with knee/ankle pain. Pt reports FBS (95-110) most days; does not recall recent A1c. No longer attending [employer] or support groups due to pandemic. States [place] sometimes to stay active. States he is no longer trying to lose weight.    Pt states he has bowel movements every 2-3 days.  Preferred Learning Style:   No preference indicated   Learning Readiness:   Ready  Change in progress  24-hr recall:  B (AM): grilled chicken wings (21-28g) + pinto beans (7g) Snk (AM): sometimes pork rinds or nuts (7-8g)  L (2 PM):  grilled chicken wings (21-28g) + pinto beans (7g) Snk (PM): watermelon D (8 PM):  grilled chicken wings (21-28g) + pinto beans (7g) Snk (PM): none  Fluid intake: water (1.5-2 times of 64 oz container); 100+ ounces Estimated total protein intake: 80+ grams  Medications: See list; takes glimipiride Supplementation: ProCare Health capsules + 3 calcium   CBG monitoring: yes, daily Average CBG per patient:  FBS 95-110 Last patient reported A1c: 9.6 (11/19/2017)  Using straws: no Drinking while  eating: no Having you been chewing well: yes Chewing/swallowing difficulties: no Changes in vision: no Changes to mood/headaches: no Hair loss/Changes to skin/Changes to nails: no, no, no Any difficulty focusing or concentrating: no Sweating: no Dizziness/Lightheaded: no Palpitations: no  Carbonated beverages: no; can tolerate well N/V/D/C/GAS: no, no, no, no, no Abdominal Pain: no Dumping syndrome: no Last Lap-Band fill: N/A  Recent physical activity:  Walks sometimes since pandemic. Was staying active with [employer] 60 min, 3x/week + biking 30 min, 3x/week  Progress Towards Goal(s):  In progress.  Handouts given during visit include:  None    Nutritional Diagnosis:  [place]-3.3 Overweight/obesity related to past poor dietary habits and physical inactivity as evidenced by patient w/ recent RYGB surgery following dietary guidelines for continued weight loss.     Intervention:  Nutrition education and counseling. Pt was encouraged to keep up the great work.   Teaching Method Utilized:  Visual Auditory Hands on  Barriers to learning/adherence to lifestyle change: none identified  Demonstrated degree of understanding via:  Teach Back   Monitoring/Evaluation:  Dietary intake, exercise, lap band fills, and body weight. Follow up in 12 months for 2.5 year post-op visit.

## 2019-01-14 ENCOUNTER — Other Ambulatory Visit: Payer: Self-pay | Admitting: Nephrology

## 2019-01-14 ENCOUNTER — Other Ambulatory Visit: Payer: Self-pay | Admitting: Physician Assistant

## 2019-01-14 DIAGNOSIS — N184 Chronic kidney disease, stage 4 (severe): Secondary | ICD-10-CM

## 2019-01-17 ENCOUNTER — Other Ambulatory Visit: Payer: Self-pay | Admitting: Nephrology

## 2019-01-17 DIAGNOSIS — N184 Chronic kidney disease, stage 4 (severe): Secondary | ICD-10-CM

## 2019-01-20 ENCOUNTER — Encounter (HOSPITAL_COMMUNITY): Payer: 59

## 2019-01-21 ENCOUNTER — Ambulatory Visit
Admission: RE | Admit: 2019-01-21 | Discharge: 2019-01-21 | Disposition: A | Discharge status: Home / Self Care | Payer: 59 | Source: Ambulatory Visit | Attending: Physician Assistant | Admitting: Physician Assistant

## 2019-01-21 DIAGNOSIS — N184 Chronic kidney disease, stage 4 (severe): Secondary | ICD-10-CM

## 2019-01-24 ENCOUNTER — Other Ambulatory Visit: Payer: Self-pay

## 2019-01-24 ENCOUNTER — Ambulatory Visit (HOSPITAL_COMMUNITY)
Admission: RE | Admit: 2019-01-24 | Discharge: 2019-01-24 | Disposition: A | Discharge status: Home / Self Care | Payer: 59 | Source: Ambulatory Visit | Attending: Nephrology | Admitting: Nephrology

## 2019-01-24 DIAGNOSIS — D631 Anemia in chronic kidney disease: Secondary | ICD-10-CM | POA: Insufficient documentation

## 2019-01-24 DIAGNOSIS — N185 Chronic kidney disease, stage 5: Secondary | ICD-10-CM

## 2019-01-24 DIAGNOSIS — N184 Chronic kidney disease, stage 4 (severe): Secondary | ICD-10-CM | POA: Diagnosis not present

## 2019-01-24 LAB — COMPREHENSIVE METABOLIC PANEL
ALT: 47 U/L — ABNORMAL HIGH (ref 0–44)
AST: 21 U/L (ref 15–41)
Albumin: 3.5 g/dL (ref 3.5–5.0)
Alkaline Phosphatase: 72 U/L (ref 38–126)
Anion gap: 13 (ref 5–15)
BUN: 111 mg/dL — ABNORMAL HIGH (ref 6–20)
CO2: 21 mmol/L — ABNORMAL LOW (ref 22–32)
Calcium: 8.4 mg/dL — ABNORMAL LOW (ref 8.9–10.3)
Chloride: 106 mmol/L (ref 98–111)
Creatinine, Ser: 8.42 mg/dL — ABNORMAL HIGH (ref 0.61–1.24)
GFR calc Af Amer: 8 mL/min — ABNORMAL LOW (ref >60)
GFR calc non Af Amer: 7 mL/min — ABNORMAL LOW (ref >60)
Glucose, Bld: 95 mg/dL (ref 70–99)
Potassium: 3.9 mmol/L (ref 3.5–5.1)
Sodium: 140 mmol/L (ref 135–145)
Total Bilirubin: 0.6 mg/dL (ref 0.3–1.2)
Total Protein: 5.8 g/dL — ABNORMAL LOW (ref 6.5–8.1)

## 2019-01-24 LAB — CBC WITH DIFFERENTIAL/PLATELET
Abs Immature Granulocytes: 0 10*3/uL (ref 0.00–0.07)
Basophils Absolute: 0.1 10*3/uL (ref 0.0–0.1)
Basophils Relative: 3 %
Eosinophils Absolute: 0.4 10*3/uL (ref 0.0–0.5)
Eosinophils Relative: 10 %
HCT: 30.4 % — ABNORMAL LOW (ref 39.0–52.0)
Hemoglobin: 9.8 g/dL — ABNORMAL LOW (ref 13.0–17.0)
Immature Granulocytes: 0 %
Lymphocytes Relative: 32 %
Lymphs Abs: 1.2 10*3/uL (ref 0.7–4.0)
MCH: 29.7 pg (ref 26.0–34.0)
MCHC: 32.2 g/dL (ref 30.0–36.0)
MCV: 92.1 fL (ref 80.0–100.0)
Monocytes Absolute: 0.5 10*3/uL (ref 0.1–1.0)
Monocytes Relative: 13 %
Neutro Abs: 1.6 10*3/uL — ABNORMAL LOW (ref 1.7–7.7)
Neutrophils Relative %: 42 %
Platelets: 176 10*3/uL (ref 150–400)
RBC: 3.3 MIL/uL — ABNORMAL LOW (ref 4.22–5.81)
RDW: 13.5 % (ref 11.5–15.5)
WBC: 3.7 10*3/uL — ABNORMAL LOW (ref 4.0–10.5)
nRBC: 0 % (ref 0.0–0.2)

## 2019-01-24 LAB — URINALYSIS, COMPLETE (UACMP) WITH MICROSCOPIC
Bilirubin Urine: NEGATIVE
Glucose, UA: NEGATIVE mg/dL
Hgb urine dipstick: NEGATIVE
Ketones, ur: NEGATIVE mg/dL
Leukocytes,Ua: NEGATIVE
Nitrite: NEGATIVE
Protein, ur: >=300 mg/dL — AB
Specific Gravity, Urine: 1.012 (ref 1.005–1.030)
pH: 5 (ref 5.0–8.0)

## 2019-01-24 LAB — PROTEIN / CREATININE RATIO, URINE
Creatinine, Urine: 115.45 mg/dL
Total Protein, Urine: 383 mg/dL

## 2019-01-24 LAB — PHOSPHORUS: Phosphorus: 6.6 mg/dL — ABNORMAL HIGH (ref 2.5–4.6)

## 2019-01-24 LAB — POCT HEMOGLOBIN-HEMACUE: Hemoglobin: 9.9 g/dL — ABNORMAL LOW (ref 13.0–17.0)

## 2019-01-24 MED ORDER — DARBEPOETIN ALFA 60 MCG/0.3ML IJ SOSY
PREFILLED_SYRINGE | INTRAMUSCULAR | Status: AC
  Administered 2019-01-24: 60 ug via SUBCUTANEOUS
  Filled 2019-01-24: qty 0.3

## 2019-01-24 MED ORDER — DARBEPOETIN ALFA 60 MCG/0.3ML IJ SOSY
60.0000 ug | PREFILLED_SYRINGE | INTRAMUSCULAR | Status: DC
  Administered 2019-01-24: 10:00:00 60 ug via SUBCUTANEOUS

## 2019-01-25 ENCOUNTER — Other Ambulatory Visit: Payer: Self-pay

## 2019-01-25 ENCOUNTER — Encounter: Payer: Self-pay | Admitting: *Deleted

## 2019-01-25 ENCOUNTER — Ambulatory Visit (INDEPENDENT_AMBULATORY_CARE_PROVIDER_SITE_OTHER): Payer: 59 | Admitting: Vascular Surgery

## 2019-01-25 ENCOUNTER — Encounter: Payer: Self-pay | Admitting: Vascular Surgery

## 2019-01-25 ENCOUNTER — Ambulatory Visit (HOSPITAL_COMMUNITY)
Admission: RE | Admit: 2019-01-25 | Discharge: 2019-01-25 | Disposition: A | Discharge status: Home / Self Care | Payer: 59 | Source: Ambulatory Visit | Attending: Surgery | Admitting: Surgery

## 2019-01-25 ENCOUNTER — Other Ambulatory Visit: Payer: Self-pay | Admitting: *Deleted

## 2019-01-25 ENCOUNTER — Ambulatory Visit (INDEPENDENT_AMBULATORY_CARE_PROVIDER_SITE_OTHER)
Admission: RE | Admit: 2019-01-25 | Discharge: 2019-01-25 | Disposition: A | Discharge status: Home / Self Care | Payer: 59 | Source: Ambulatory Visit | Attending: Surgery | Admitting: Surgery

## 2019-01-25 VITALS — BP 118/71 | HR 66 | Temp 97.7°F | Resp 20 | Ht 68.0 in | Wt 201.5 lb

## 2019-01-25 DIAGNOSIS — N185 Chronic kidney disease, stage 5: Secondary | ICD-10-CM

## 2019-01-25 NOTE — Progress Notes (Signed)
Vascular and Vein Specialist of [facility]  Patient name: Devrin Monforte Junco MRN: [id number] DOB: 1965-08-01 Sex: male  REASON FOR CONSULT: Discuss access for hemodialysis  HPI: Dinh Ayotte Hammers is a 53 y.o. male, who is here today for discussion of access for hemodialysis.  He is chronic kidney disease stage V but not on hemodialysis currently.  We have been asked to place a fistula if possible and delay graft placement if fistula is not possible.  He is right-handed.  He does not have a pacemaker.  He has no prior history of central access and no history of arm access.  Past Medical History:  Diagnosis Date  . Anemia    iron infusions in last few months for anemia   . Arthritis    ankles   . Chronic kidney disease    followed by Dr Arty Baumgartner   . Diabetes mellitus without complication ([place])    type II   . History of anemia due to CKD   . History of anemia due to CKD 12/22/2018  . Hypertension   . Obesity   . PVC (premature ventricular contraction) 02/16/2018  . Sinus congestion    all the time  . Sleep apnea    cpap off and on     Family History  Problem Relation Age of Onset  . Hypertension Other   . Hypertension Mother   . Cataracts Father     SOCIAL HISTORY: Social History   Socioeconomic History  . Marital status: Married    Spouse name: Not on file  . Number of children: Not on file  . Years of education: Not on file  . Highest education level: Not on file  Occupational History  . Not on file  Social Needs  . Financial resource strain: Not on file  . Food insecurity    Worry: Not on file    Inability: Not on file  . Transportation needs    Medical: Not on file    Non-medical: Not on file  Tobacco Use  . Smoking status: Never Smoker  . Smokeless tobacco: Never Used  Substance and Sexual Activity  . Alcohol use: Yes    Alcohol/week: 5.0 standard drinks    Types: 5 Cans of beer per week    Comment: Social  . Drug use: No   . Sexual activity: Not on file  Lifestyle  . Physical activity    Days per week: Not on file    Minutes per session: Not on file  . Stress: Not on file  Relationships  . Social Herbalist on phone: Not on file    Gets together: Not on file    Attends religious service: Not on file    Active member of club or organization: Not on file    Attends meetings of clubs or organizations: Not on file    Relationship status: Not on file  . Intimate partner violence    Fear of current or ex partner: Not on file    Emotionally abused: Not on file    Physically abused: Not on file    Forced sexual activity: Not on file  Other Topics Concern  . Not on file  Social History Narrative  . Not on file    No Known Allergies  Current Outpatient Medications  Medication Sig Dispense Refill  . acetaminophen (TYLENOL) 500 MG tablet Take 1,000 mg by mouth every 6 (six) hours as needed (for pain.).    Marland Kitchen  ALLOPURINOL PO Take by mouth.    Marland Kitchen amLODipine (NORVASC) 10 MG tablet Take 10 mg by mouth daily.    Marland Kitchen aspirin 81 MG chewable tablet Chew 1 tablet (81 mg total) by mouth daily. 1 tablet   . AURYXIA 1 GM 210 MG(Fe) tablet TAKE 2 TABLETS BY MOUTH 3 TIMES DAILY    . carvedilol (COREG) 12.5 MG tablet TAKE 1 TABLET BY MOUTH 2 TIMES DAILY. 180 tablet 3  . doxazosin (CARDURA) 4 MG tablet Take 4 mg by mouth at bedtime.     . furosemide (LASIX) 20 MG tablet Take 40 mg by mouth.     . isosorbide-hydrALAZINE (BIDIL) 20-37.5 MG tablet Take 2 tablets by mouth 3 (three) times daily.     No current facility-administered medications for this visit.     REVIEW OF SYSTEMS:  [X]  denotes positive finding, [ ]  denotes negative finding Cardiac  Comments:  Chest pain or chest pressure:    Shortness of breath upon exertion:    Short of breath when lying flat:    Irregular heart rhythm:        Vascular    Pain in calf, thigh, or hip brought on by ambulation:    Pain in feet at night that wakes you up from  your sleep:     Blood clot in your veins:    Leg swelling:         Pulmonary    Oxygen at home:    Productive cough:     Wheezing:         Neurologic    Sudden weakness in arms or legs:     Sudden numbness in arms or legs:     Sudden onset of difficulty speaking or slurred speech:    Temporary loss of vision in one eye:     Problems with dizziness:         Gastrointestinal    Blood in stool:     Vomited blood:         Genitourinary    Burning when urinating:     Blood in urine:        Psychiatric    Major depression:         Hematologic    Bleeding problems:    Problems with blood clotting too easily:        Skin    Rashes or ulcers:        Constitutional    Fever or chills:      PHYSICAL EXAM: Vitals:   01/25/19 1019  BP: 118/71  Pulse: 66  Resp: 20  Temp: 97.7 F (36.5 C)  SpO2: 99%  Weight: 201 lb 8 oz (91.4 kg)  Height: 5\' 8"  (1.727 m)    GENERAL: The patient is a well-nourished male, in no acute distress. The vital signs are documented above. CARDIOVASCULAR: 2+ radial pulses bilaterally large surface veins on the right and moderate on the left PULMONARY: There is good air exchange  ABDOMEN: Soft and non-tender  MUSCULOSKELETAL: There are no major deformities or cyanosis. NEUROLOGIC: No focal weakness or paresthesias are detected. SKIN: There are no ulcers or rashes noted. PSYCHIATRIC: The patient has a normal affect.  DATA:  Venous and arterial upper extremity studies were obtained today.  Patient has normal triphasic arterial flow bilaterally.  He does have patent cephalic veins bilaterally.  These are moderate on the left and moderate to large on the right.  MEDICAL ISSUES: I had a long discussion with patient  regarding option for hemodialysis.  I discussed catheter for acute dialysis which she does not need.  Also discussed AV fistula and AV graft.  He does have excellent veins in his right arm for fistula creation.  He has moderate veins in his  left arm.  I explained that I would recommend right arm access even though he is right arm dominant due to his better veins in the right side.  He does appear that he would be an excellent candidate for radiocephalic fistula creation.  He continues to work and would like to do this at the end of the week so he can have the weekend off and then return to work the first of the next week.  We will schedule this at his earliest convenience.   Rosetta Posner, MD FACS [facility] of [facility] Tel [phone] Pager [phone]

## 2019-02-09 ENCOUNTER — Encounter (HOSPITAL_COMMUNITY): Payer: Self-pay

## 2019-02-15 ENCOUNTER — Other Ambulatory Visit (HOSPITAL_COMMUNITY)
Admission: RE | Admit: 2019-02-15 | Discharge: 2019-02-15 | Disposition: A | Discharge status: Home / Self Care | Payer: 59 | Source: Ambulatory Visit | Attending: Vascular Surgery | Admitting: Vascular Surgery

## 2019-02-15 DIAGNOSIS — Z01812 Encounter for preprocedural laboratory examination: Secondary | ICD-10-CM | POA: Diagnosis present

## 2019-02-15 DIAGNOSIS — Z20828 Contact with and (suspected) exposure to other viral communicable diseases: Secondary | ICD-10-CM | POA: Insufficient documentation

## 2019-02-16 ENCOUNTER — Other Ambulatory Visit: Payer: Self-pay

## 2019-02-16 ENCOUNTER — Encounter (HOSPITAL_COMMUNITY): Payer: Self-pay | Admitting: *Deleted

## 2019-02-16 NOTE — Progress Notes (Signed)
Pt denies SOB and chest pain. Pt stated that he is under the care of Dr. Terrence Dupont, Cardiology and PCP. Pt denies having a cardiac cath. Pt made aware to stop taking  vitamins, fish oil and herbal medications. Do not take any NSAIDs ie: Ibuprofen, Advil, Naproxen (Aleve), Motrin, BC and Goody Powder. Nurse requested EKG tracing from [facility]; awaiting response.  Pt made aware to check CBG every 2 hours prior to arrival to hospital on DOS. Pt made aware to treat a CBG < 70 with 4 ounces of apple  juice, wait 15 minutes after intervention to recheck CBG, if CBG remains < 70, call Short Stay unit to speak with a nurse. Pt verbalize understanding of all pre-op instructions.

## 2019-02-17 LAB — NOVEL CORONAVIRUS, NAA (HOSP ORDER, SEND-OUT TO REF LAB; TAT 18-24 HRS)

## 2019-02-18 ENCOUNTER — Encounter (HOSPITAL_COMMUNITY)
Admission: RE | Disposition: A | Discharge status: Home / Self Care | Payer: Self-pay | Source: Home / Self Care | Attending: Vascular Surgery

## 2019-02-18 ENCOUNTER — Ambulatory Visit (HOSPITAL_COMMUNITY): Payer: 59 | Admitting: Anesthesiology

## 2019-02-18 ENCOUNTER — Encounter (HOSPITAL_COMMUNITY): Payer: Self-pay | Admitting: *Deleted

## 2019-02-18 ENCOUNTER — Ambulatory Visit (HOSPITAL_COMMUNITY)
Admission: RE | Admit: 2019-02-18 | Discharge: 2019-02-18 | Disposition: A | Discharge status: Home / Self Care | Payer: 59 | Attending: Vascular Surgery | Admitting: Vascular Surgery

## 2019-02-18 DIAGNOSIS — Z6830 Body mass index (BMI) 30.0-30.9, adult: Secondary | ICD-10-CM | POA: Insufficient documentation

## 2019-02-18 DIAGNOSIS — E1122 Type 2 diabetes mellitus with diabetic chronic kidney disease: Secondary | ICD-10-CM | POA: Diagnosis not present

## 2019-02-18 DIAGNOSIS — I120 Hypertensive chronic kidney disease with stage 5 chronic kidney disease or end stage renal disease: Secondary | ICD-10-CM | POA: Diagnosis not present

## 2019-02-18 DIAGNOSIS — M1990 Unspecified osteoarthritis, unspecified site: Secondary | ICD-10-CM | POA: Diagnosis not present

## 2019-02-18 DIAGNOSIS — Z20828 Contact with and (suspected) exposure to other viral communicable diseases: Secondary | ICD-10-CM | POA: Diagnosis not present

## 2019-02-18 DIAGNOSIS — G4730 Sleep apnea, unspecified: Secondary | ICD-10-CM | POA: Diagnosis not present

## 2019-02-18 DIAGNOSIS — Z79899 Other long term (current) drug therapy: Secondary | ICD-10-CM | POA: Diagnosis not present

## 2019-02-18 DIAGNOSIS — Z7982 Long term (current) use of aspirin: Secondary | ICD-10-CM | POA: Diagnosis not present

## 2019-02-18 DIAGNOSIS — E669 Obesity, unspecified: Secondary | ICD-10-CM | POA: Insufficient documentation

## 2019-02-18 DIAGNOSIS — Z8249 Family history of ischemic heart disease and other diseases of the circulatory system: Secondary | ICD-10-CM | POA: Insufficient documentation

## 2019-02-18 DIAGNOSIS — N185 Chronic kidney disease, stage 5: Secondary | ICD-10-CM

## 2019-02-18 HISTORY — PX: AV FISTULA PLACEMENT: SHX1204

## 2019-02-18 HISTORY — DX: Gout, unspecified: M10.9

## 2019-02-18 LAB — POCT I-STAT, CHEM 8
BUN: 97 mg/dL — ABNORMAL HIGH (ref 6–20)
Calcium, Ion: 1.11 mmol/L — ABNORMAL LOW (ref 1.15–1.40)
Chloride: 108 mmol/L (ref 98–111)
Creatinine, Ser: 9.4 mg/dL — ABNORMAL HIGH (ref 0.61–1.24)
Glucose, Bld: 97 mg/dL (ref 70–99)
HCT: 31 % — ABNORMAL LOW (ref 39.0–52.0)
Hemoglobin: 10.5 g/dL — ABNORMAL LOW (ref 13.0–17.0)
Potassium: 3.6 mmol/L (ref 3.5–5.1)
Sodium: 141 mmol/L (ref 135–145)
TCO2: 19 mmol/L — ABNORMAL LOW (ref 22–32)

## 2019-02-18 LAB — GLUCOSE, CAPILLARY
Glucose-Capillary: 94 mg/dL (ref 70–99)
Glucose-Capillary: 106 mg/dL — ABNORMAL HIGH (ref 70–99)

## 2019-02-18 LAB — SARS CORONAVIRUS 2 BY RT PCR (HOSPITAL ORDER, PERFORMED IN ~~LOC~~ HOSPITAL LAB): SARS Coronavirus 2: NEGATIVE

## 2019-02-18 SURGERY — ARTERIOVENOUS (AV) FISTULA CREATION
Anesthesia: Monitor Anesthesia Care | Site: Arm Lower | Laterality: Right

## 2019-02-18 MED ORDER — PAPAVERINE HCL 30 MG/ML IJ SOLN
INTRAMUSCULAR | Status: AC
  Filled 2019-02-18: qty 2

## 2019-02-18 MED ORDER — SODIUM CHLORIDE 0.9 % IV SOLN
INTRAVENOUS | Status: DC | PRN
  Administered 2019-02-18: 07:00:00 via INTRAVENOUS

## 2019-02-18 MED ORDER — PROPOFOL 500 MG/50ML IV EMUL
INTRAVENOUS | Status: DC | PRN
  Administered 2019-02-18: 75 ug/kg/min via INTRAVENOUS

## 2019-02-18 MED ORDER — 0.9 % SODIUM CHLORIDE (POUR BTL) OPTIME
TOPICAL | Status: DC | PRN
  Administered 2019-02-18: 1000 mL

## 2019-02-18 MED ORDER — FENTANYL CITRATE (PF) 100 MCG/2ML IJ SOLN
INTRAMUSCULAR | Status: DC | PRN
  Administered 2019-02-18: 25 ug via INTRAVENOUS

## 2019-02-18 MED ORDER — PROPOFOL 10 MG/ML IV BOLUS
INTRAVENOUS | Status: DC | PRN
  Administered 2019-02-18: 20 mg via INTRAVENOUS
  Administered 2019-02-18: 10 mg via INTRAVENOUS

## 2019-02-18 MED ORDER — SODIUM CHLORIDE 0.9 % IV SOLN: INTRAVENOUS | Status: DC

## 2019-02-18 MED ORDER — LIDOCAINE-EPINEPHRINE (PF) 1 %-1:200000 IJ SOLN
INTRAMUSCULAR | Status: AC
  Filled 2019-02-18: qty 30

## 2019-02-18 MED ORDER — LIDOCAINE HCL 1 % IJ SOLN
INTRAMUSCULAR | Status: AC
  Filled 2019-02-18: qty 20

## 2019-02-18 MED ORDER — OXYCODONE-ACETAMINOPHEN 5-325 MG PO TABS: 1.0000 | ORAL_TABLET | Freq: Four times a day (QID) | ORAL | 0 refills | Status: DC | PRN

## 2019-02-18 MED ORDER — FENTANYL CITRATE (PF) 250 MCG/5ML IJ SOLN
INTRAMUSCULAR | Status: AC
  Filled 2019-02-18: qty 5

## 2019-02-18 MED ORDER — CHLORHEXIDINE GLUCONATE 4 % EX LIQD: 60.0000 mL | Freq: Once | CUTANEOUS | Status: DC

## 2019-02-18 MED ORDER — SODIUM CHLORIDE 0.9 % IV SOLN
INTRAVENOUS | Status: DC | PRN
  Administered 2019-02-18: 25 ug/min via INTRAVENOUS

## 2019-02-18 MED ORDER — CEFAZOLIN SODIUM-DEXTROSE 2-4 GM/100ML-% IV SOLN
2.0000 g | INTRAVENOUS | Status: AC
  Administered 2019-02-18: 2 g via INTRAVENOUS
  Filled 2019-02-18: qty 100

## 2019-02-18 MED ORDER — MIDAZOLAM HCL 2 MG/2ML IJ SOLN
INTRAMUSCULAR | Status: AC
  Filled 2019-02-18: qty 2

## 2019-02-18 MED ORDER — PROPOFOL 10 MG/ML IV BOLUS
INTRAVENOUS | Status: AC
  Filled 2019-02-18: qty 20

## 2019-02-18 MED ORDER — SODIUM CHLORIDE 0.9 % IV SOLN
INTRAVENOUS | Status: AC
  Filled 2019-02-18: qty 1.2

## 2019-02-18 MED ORDER — ONDANSETRON HCL 4 MG/2ML IJ SOLN
INTRAMUSCULAR | Status: DC | PRN
  Administered 2019-02-18: 4 mg via INTRAVENOUS

## 2019-02-18 MED ORDER — LIDOCAINE HCL 1 % IJ SOLN
INTRAMUSCULAR | Status: DC | PRN
  Administered 2019-02-18: 8 mL

## 2019-02-18 MED ORDER — MIDAZOLAM HCL 5 MG/5ML IJ SOLN
INTRAMUSCULAR | Status: DC | PRN
  Administered 2019-02-18: 2 mg via INTRAVENOUS

## 2019-02-18 MED ORDER — HEPARIN SODIUM (PORCINE) 1000 UNIT/ML IJ SOLN
INTRAMUSCULAR | Status: DC | PRN
  Administered 2019-02-18: 3000 [IU] via INTRAVENOUS

## 2019-02-18 MED ORDER — SODIUM CHLORIDE 0.9 % IV SOLN
INTRAVENOUS | Status: DC | PRN
  Administered 2019-02-18: 500 mL

## 2019-02-18 SURGICAL SUPPLY — 41 items
ADH SKN CLS APL DERMABOND .7 (GAUZE/BANDAGES/DRESSINGS) ×1
AGENT HMST SPONGE THK3/8 (HEMOSTASIS)
ARMBAND PINK RESTRICT EXTREMIT (MISCELLANEOUS) ×3 IMPLANT
CANISTER SUCT 3000ML PPV (MISCELLANEOUS) ×2 IMPLANT
CLIP VESOCCLUDE MED 6/CT (CLIP) ×2 IMPLANT
CLIP VESOCCLUDE SM WIDE 6/CT (CLIP) ×2 IMPLANT
COVER PROBE W GEL 5X96 (DRAPES) ×2 IMPLANT
COVER WAND RF STERILE (DRAPES) ×1 IMPLANT
DECANTER SPIKE VIAL GLASS SM (MISCELLANEOUS) ×2 IMPLANT
DERMABOND ADVANCED (GAUZE/BANDAGES/DRESSINGS) ×1
DERMABOND ADVANCED .7 DNX12 (GAUZE/BANDAGES/DRESSINGS) ×1 IMPLANT
ELECT REM PT RETURN 9FT ADLT (ELECTROSURGICAL) ×2
ELECTRODE REM PT RTRN 9FT ADLT (ELECTROSURGICAL) ×1 IMPLANT
GLOVE BIO SURGEON STRL SZ7.5 (GLOVE) ×3 IMPLANT
GLOVE BIOGEL PI IND STRL 6.5 (GLOVE) IMPLANT
GLOVE BIOGEL PI IND STRL 7.0 (GLOVE) IMPLANT
GLOVE BIOGEL PI IND STRL 7.5 (GLOVE) IMPLANT
GLOVE BIOGEL PI IND STRL 8 (GLOVE) ×1 IMPLANT
GLOVE BIOGEL PI INDICATOR 6.5 (GLOVE) ×1
GLOVE BIOGEL PI INDICATOR 7.0 (GLOVE) ×1
GLOVE BIOGEL PI INDICATOR 7.5 (GLOVE) ×2
GLOVE BIOGEL PI INDICATOR 8 (GLOVE) ×1
GLOVE ECLIPSE 7.0 STRL STRAW (GLOVE) ×1 IMPLANT
GOWN STRL REUS W/ TWL LRG LVL3 (GOWN DISPOSABLE) ×2 IMPLANT
GOWN STRL REUS W/ TWL XL LVL3 (GOWN DISPOSABLE) ×2 IMPLANT
GOWN STRL REUS W/TWL LRG LVL3 (GOWN DISPOSABLE) ×6
GOWN STRL REUS W/TWL XL LVL3 (GOWN DISPOSABLE) ×4
HEMOSTAT SPONGE AVITENE ULTRA (HEMOSTASIS) IMPLANT
KIT BASIN OR (CUSTOM PROCEDURE TRAY) ×2 IMPLANT
KIT TURNOVER KIT B (KITS) ×2 IMPLANT
NS IRRIG 1000ML POUR BTL (IV SOLUTION) ×2 IMPLANT
PACK CV ACCESS (CUSTOM PROCEDURE TRAY) ×2 IMPLANT
PAD ARMBOARD 7.5X6 YLW CONV (MISCELLANEOUS) ×4 IMPLANT
SUT MNCRL AB 4-0 PS2 18 (SUTURE) ×2 IMPLANT
SUT PROLENE 6 0 BV (SUTURE) ×2 IMPLANT
SUT PROLENE 7 0 BV 1 (SUTURE) IMPLANT
SUT VIC AB 3-0 SH 27 (SUTURE) ×2
SUT VIC AB 3-0 SH 27X BRD (SUTURE) ×1 IMPLANT
TOWEL GREEN STERILE (TOWEL DISPOSABLE) ×2 IMPLANT
UNDERPAD 30X30 (UNDERPADS AND DIAPERS) ×2 IMPLANT
WATER STERILE IRR 1000ML POUR (IV SOLUTION) ×2 IMPLANT

## 2019-02-18 NOTE — Anesthesia Preprocedure Evaluation (Addendum)
Anesthesia Evaluation  Patient identified by MRN, date of birth, ID band Patient awake    Reviewed: Allergy & Precautions, NPO status , Patient's Chart, lab work & pertinent test results  Airway Mallampati: II  TM Distance: >3 FB Neck ROM: full    Dental  (+) Dental Advidsory Given, Teeth Intact   Pulmonary sleep apnea ,    Pulmonary exam normal        Cardiovascular hypertension, Pt. on medications Normal cardiovascular exam     Neuro/Psych    GI/Hepatic   Endo/Other  diabetes, Type 2Morbid obesity  Renal/GU CRFRenal disease     Musculoskeletal   Abdominal   Peds  Hematology  (+) Blood dyscrasia, anemia ,   Anesthesia Other Findings   Reproductive/Obstetrics                           Anesthesia Physical Anesthesia Plan  ASA: III  Anesthesia Plan: MAC   Post-op Pain Management:    Induction: Intravenous  PONV Risk Score and Plan: 1 and Ondansetron and Treatment may vary due to age or medical condition  Airway Management Planned: Simple Face Mask  Additional Equipment:   Intra-op Plan:   Post-operative Plan:   Informed Consent: I have reviewed the patients History and Physical, chart, labs and discussed the procedure including the risks, benefits and alternatives for the proposed anesthesia with the patient or authorized representative who has indicated his/her understanding and acceptance.     Dental Advisory Given  Plan Discussed with: CRNA, Surgeon and Anesthesiologist  Anesthesia Plan Comments:        Anesthesia Quick Evaluation

## 2019-02-18 NOTE — Progress Notes (Signed)
    Mr. Mitcheal Peregrina had right arm surgery and needs to be on light duty no lifting restrictions > 10 lbs for 2 weeks.     Roxy Horseman PA-C

## 2019-02-18 NOTE — Discharge Instructions (Signed)
° °  Vascular and Vein Specialists of [place] ° °Discharge Instructions ° °AV Fistula or Graft Surgery for Dialysis Access ° °Please refer to the following instructions for your post-procedure care. Your surgeon or physician assistant will discuss any changes with you. ° °Activity ° °You may drive the day following your surgery, if you are comfortable and no longer taking prescription pain medication. Resume full activity as the soreness in your incision resolves. ° °Bathing/Showering ° °You may shower after you go home. Keep your incision dry for 48 hours. Do not soak in a bathtub, hot tub, or swim until the incision heals completely. You may not shower if you have a hemodialysis catheter. ° °Incision Care ° °Clean your incision with mild soap and water after 48 hours. Pat the area dry with a clean towel. You do not need a bandage unless otherwise instructed. Do not apply any ointments or creams to your incision. You may have skin glue on your incision. Do not peel it off. It will come off on its own in about one week. Your arm may swell a bit after surgery. To reduce swelling use pillows to elevate your arm so it is above your heart. Your doctor will tell you if you need to lightly wrap your arm with an ACE bandage. ° °Diet ° °Resume your normal diet. There are not special food restrictions following this procedure. In order to heal from your surgery, it is CRITICAL to get adequate nutrition. Your body requires vitamins, minerals, and protein. Vegetables are the best source of vitamins and minerals. Vegetables also provide the perfect balance of protein. Processed food has little nutritional value, so try to avoid this. ° °Medications ° °Resume taking all of your medications. If your incision is causing pain, you may take over-the counter pain relievers such as acetaminophen (Tylenol). If you were prescribed a stronger pain medication, please be aware these medications can cause nausea and constipation. Prevent  nausea by taking the medication with a snack or meal. Avoid constipation by drinking plenty of fluids and eating foods with high amount of fiber, such as fruits, vegetables, and grains. Do not take Tylenol if you are taking prescription pain medications. ° ° ° ° °Follow up °Your surgeon may want to see you in the office following your access surgery. If so, this will be arranged at the time of your surgery. ° °Please call us immediately for any of the following conditions: ° °Increased pain, redness, drainage (pus) from your incision site °Fever of 101 degrees or higher °Severe or worsening pain at your incision site °Hand pain or numbness. ° °Reduce your risk of vascular disease: ° °Stop smoking. If you would like help, call [facility] at 1-800-QUIT-NOW (1-800-784-8669) or [place] at 336-586-4000 ° °Manage your cholesterol °Maintain a desired weight °Control your diabetes °Keep your blood pressure down ° °Dialysis ° °It will take several weeks to several months for your new dialysis access to be ready for use. Your surgeon will determine when it is OK to use it. Your nephrologist will continue to direct your dialysis. You can continue to use your Permcath until your new access is ready for use. ° °If you have any questions, please call the office at [phone]. ° °

## 2019-02-18 NOTE — Anesthesia Postprocedure Evaluation (Signed)
Anesthesia Post Note  Patient: [name]  Procedure(s) Performed: ARTERIOVENOUS (AV) FISTULA CREATION RIGHT ARM (Right Arm Lower)     Patient location during evaluation: PACU Anesthesia Type: MAC Level of consciousness: awake and alert Pain management: pain level controlled Vital Signs Assessment: post-procedure vital signs reviewed and stable Respiratory status: spontaneous breathing, nonlabored ventilation, respiratory function stable and patient connected to nasal cannula oxygen Cardiovascular status: stable and blood pressure returned to baseline Postop Assessment: no apparent nausea or vomiting Anesthetic complications: no    Last Vitals:  Vitals:   02/18/19 0908 02/18/19 0918  BP: 110/71 120/79  Pulse: (!) 56 (!) 55  Resp: 18   Temp: (!) 36.1 C   SpO2: 98% 100%    Last Pain: There were no vitals filed for this visit.               [place]

## 2019-02-18 NOTE — Anesthesia Procedure Notes (Signed)
Procedure Name: MAC Date/Time: 02/18/2019 7:36 AM Performed by: Neldon Newport, CRNA Pre-anesthesia Checklist: Timeout performed, Patient being monitored, Suction available, Emergency Drugs available and Patient identified Patient Re-evaluated:Patient Re-evaluated prior to induction Oxygen Delivery Method: Simple face mask

## 2019-02-18 NOTE — Transfer of Care (Signed)
Immediate Anesthesia Transfer of Care Note  Patient: [name]  Procedure(s) Performed: ARTERIOVENOUS (AV) FISTULA CREATION RIGHT ARM (Right Arm Lower)  Patient Location: PACU  Anesthesia Type:MAC  Level of Consciousness: awake, alert  and oriented  Airway & Oxygen Therapy: Patient Spontanous Breathing  Post-op Assessment: Report given to RN, Post -op Vital signs reviewed and stable and Patient moving all extremities X 4  Post vital signs: Reviewed and stable  Last Vitals:  Vitals Value Taken Time  BP 105/72 02/18/19 0844  Temp    Pulse 103 02/18/19 0843  Resp 11 02/18/19 0843  SpO2 100 % 02/18/19 0843  Vitals shown include unvalidated device data.  Last Pain: There were no vitals filed for this visit.    Patients Stated Pain Goal: 3 (76/14/70 9295)  Complications: No apparent anesthesia complications

## 2019-02-18 NOTE — H&P (Signed)
History and Physical Interval Note:  02/18/2019 7:16 AM  [name]  has presented today for surgery, with the diagnosis of CHRONIC KIDNEY DISEASE FOR HEMODIALYSIS ACCESS.  The various methods of treatment have been discussed with the patient and family. After consideration of risks, benefits and other options for treatment, the patient has consented to  Procedure(s): ARTERIOVENOUS (AV) FISTULA CREATION RIGHT ARM (Right) as a surgical intervention.  The patient's history has been reviewed, patient examined, no change in status, stable for surgery.  I have reviewed the patient's chart and labs.  Questions were answered to the patient's satisfaction.    Right arm AVF  Marty Heck  Vascular and Vein Specialist of [facility]  Patient name: [name] MRN: [id number]        DOB: 1966/04/08        Sex: male  REASON FOR CONSULT: Discuss access for hemodialysis  HPI: [name] is a 53 y.o. male, who is here today for discussion of access for hemodialysis.  He is chronic kidney disease stage V but not on hemodialysis currently.  We have been asked to place a fistula if possible and delay graft placement if fistula is not possible.  He is right-handed.  He does not have a pacemaker.  He has no prior history of central access and no history of arm access.      Past Medical History:  Diagnosis Date  . Anemia    iron infusions in last few months for anemia   . Arthritis    ankles   . Chronic kidney disease    followed by Dr Arty Baumgartner   . Diabetes mellitus without complication ([place])    type II   . History of anemia due to CKD   . History of anemia due to CKD 12/22/2018  . Hypertension   . Obesity   . PVC (premature ventricular contraction) 02/16/2018  . Sinus congestion    all the time  . Sleep apnea    cpap off and on          Family History  Problem Relation Age of Onset  . Hypertension Other   . Hypertension Mother   . Cataracts Father      SOCIAL HISTORY: Social History        Socioeconomic History  . Marital status: Married    Spouse name: Not on file  . Number of children: Not on file  . Years of education: Not on file  . Highest education level: Not on file  Occupational History  . Not on file  Social Needs  . Financial resource strain: Not on file  . Food insecurity    Worry: Not on file    Inability: Not on file  . Transportation needs    Medical: Not on file    Non-medical: Not on file  Tobacco Use  . Smoking status: Never Smoker  . Smokeless tobacco: Never Used  Substance and Sexual Activity  . Alcohol use: Yes    Alcohol/week: 5.0 standard drinks    Types: 5 Cans of beer per week    Comment: Social  . Drug use: No  . Sexual activity: Not on file  Lifestyle  . Physical activity    Days per week: Not on file    Minutes per session: Not on file  . Stress: Not on file  Relationships  . Social Herbalist on phone: Not on file    Gets together: Not on  file    Attends religious service: Not on file    Active member of club or organization: Not on file    Attends meetings of clubs or organizations: Not on file    Relationship status: Not on file  . Intimate partner violence    Fear of current or ex partner: Not on file    Emotionally abused: Not on file    Physically abused: Not on file    Forced sexual activity: Not on file  Other Topics Concern  . Not on file  Social History Narrative  . Not on file    No Known Allergies  Current Outpatient Medications  Medication Sig Dispense Refill  . acetaminophen (TYLENOL) 500 MG tablet Take 1,000 mg by mouth every 6 (six) hours as needed (for pain.).    Marland Kitchen ALLOPURINOL PO Take by mouth.    Marland Kitchen amLODipine (NORVASC) 10 MG tablet Take 10 mg by mouth daily.    Marland Kitchen aspirin 81 MG chewable tablet Chew 1 tablet (81 mg total) by mouth daily. 1 tablet   . AURYXIA 1 GM 210 MG(Fe) tablet TAKE 2  TABLETS BY MOUTH 3 TIMES DAILY    . carvedilol (COREG) 12.5 MG tablet TAKE 1 TABLET BY MOUTH 2 TIMES DAILY. 180 tablet 3  . doxazosin (CARDURA) 4 MG tablet Take 4 mg by mouth at bedtime.     . furosemide (LASIX) 20 MG tablet Take 40 mg by mouth.     . isosorbide-hydrALAZINE (BIDIL) 20-37.5 MG tablet Take 2 tablets by mouth 3 (three) times daily.     No current facility-administered medications for this visit.     REVIEW OF SYSTEMS:  [X]  denotes positive finding, [ ]  denotes negative finding Cardiac  Comments:  Chest pain or chest pressure:    Shortness of breath upon exertion:    Short of breath when lying flat:    Irregular heart rhythm:        Vascular    Pain in calf, thigh, or hip brought on by ambulation:    Pain in feet at night that wakes you up from your sleep:     Blood clot in your veins:    Leg swelling:         Pulmonary    Oxygen at home:    Productive cough:     Wheezing:         Neurologic    Sudden weakness in arms or legs:     Sudden numbness in arms or legs:     Sudden onset of difficulty speaking or slurred speech:    Temporary loss of vision in one eye:     Problems with dizziness:         Gastrointestinal    Blood in stool:     Vomited blood:         Genitourinary    Burning when urinating:     Blood in urine:        Psychiatric    Major depression:         Hematologic    Bleeding problems:    Problems with blood clotting too easily:        Skin    Rashes or ulcers:        Constitutional    Fever or chills:      PHYSICAL EXAM:    Vitals:   01/25/19 1019  BP: 118/71  Pulse: 66  Resp: 20  Temp: 97.7 F (36.5 C)  SpO2: 99%  Weight: 201 lb 8 oz (91.4 kg)  Height: 5\' 8"  (1.727 m)    GENERAL: The patient is a well-nourished male, in no acute distress. The vital signs are documented above. CARDIOVASCULAR: 2+ radial pulses  bilaterally large surface veins on the right and moderate on the left PULMONARY: There is good air exchange  ABDOMEN: Soft and non-tender  MUSCULOSKELETAL: There are no major deformities or cyanosis. NEUROLOGIC: No focal weakness or paresthesias are detected. SKIN: There are no ulcers or rashes noted. PSYCHIATRIC: The patient has a normal affect.  DATA:  Venous and arterial upper extremity studies were obtained today.  Patient has normal triphasic arterial flow bilaterally.  He does have patent cephalic veins bilaterally.  These are moderate on the left and moderate to large on the right.  MEDICAL ISSUES: I had a long discussion with patient regarding option for hemodialysis.  I discussed catheter for acute dialysis which she does not need.  Also discussed AV fistula and AV graft.  He does have excellent veins in his right arm for fistula creation.  He has moderate veins in his left arm.  I explained that I would recommend right arm access even though he is right arm dominant due to his better veins in the right side.  He does appear that he would be an excellent candidate for radiocephalic fistula creation.  He continues to work and would like to do this at the end of the week so he can have the weekend off and then return to work the first of the next week.  We will schedule this at his earliest convenience.   Rosetta Posner, MD FACS [facility] of [facility] Tel [phone] Pager [phone]

## 2019-02-18 NOTE — Op Note (Signed)
OPERATIVE NOTE   PROCEDURE:right radiocephaic arteriovenous fistula placement  PRE-OPERATIVE DIAGNOSIS: Stage V CKD  POST-OPERATIVE DIAGNOSIS: Same  SURGEON: Monica Martinez, MD  ASSISTANT(S): Roxy Horseman, PA  ANESTHESIA: MAC  ESTIMATED BLOOD LOSS: Minimal  FINDING(S): 1.  Cephalic vein: 3 mm, acceptable 2.  Radial artery: 3 mm, disease free 3.  Venous outflow: palpable thrill  4.  Radial flow: palpable radial pulse  SPECIMEN(S):  none  INDICATIONS:   [name] is a 53 y.o. male who presents with stage V CKD for permanent hemodialysis access.  The patient is scheduled for right radiocephalic arteriovenous fistula placement.  The patient is aware the risks include but are not limited to: bleeding, infection, steal syndrome, nerve damage, ischemic monomelic neuropathy, failure to mature, and need for additional procedures.  The patient is aware of the risks of the procedure and elects to proceed forward.  DESCRIPTION: After full informed written consent was obtained from the patient, the patient was brought back to the operating room and placed supine upon the operating table.  Prior to induction, the patient received IV antibiotics.   After obtaining adequate anesthesia, the patient was then prepped and draped in the standard fashion for a right arm access procedure.   I turned my attention first to identifying the patient's distal cephalic vein and radial artery.  Using SonoSite guidance, the location of these vessels were marked out on the skin.   At this point, I injected local anesthetic to obtain a field block of the wrist.  In total, I injected about 10 mL of 1% lidocaine without epinephrine.  I made a longitudinal incision at the level of the wrist and dissected through the subcutaneous tissue and fascia to gain exposure of the radial artery.  This was noted to be 3 mm in diameter externally.  This was dissected out proximally and distally and controlled with  vessel loops .  I then dissected out the cephalic vein.  This was noted to be 3 mm in diameter externally.  The distal segment of the vein was ligated with a  2-0 silk, and the vein was transected.  The proximal segment was interrogated with serial dilators.  The vein accepted up to a 4 mm dilator without any difficulty.  I then instilled the heparinized saline into the vein and clamped it.  At this point, I reset my exposure of the radial artery and placed the artery under tension proximally and distally.  I made an arteriotomy with a #11 blade, and then I extended the arteriotomy with a Potts scissor.  I injected heparinized saline proximal and distal to this arteriotomy.  The vein was then sewn to the artery in an end-to-side configuration with a running stitch of 6-0 Prolene.  Prior to completing this anastomosis, I allowed the vein and artery to backbleed.  There was no evidence of clot from any vessels.  I completed the anastomosis in the usual fashion and then released all vessel loops and clamps.    There was a palpable thrill in the venous outflow, and there was a palpable radial pulse.  At this point, I irrigated out the surgical wound.  There was no further active bleeding.  The subcutaneous tissue was reapproximated with a running stitch of 3-0 Vicryl.  The skin was then reapproximated with a running subcuticular stitch of 4-0 Monocryl.  The skin was then cleaned, dried, and reinforced with Dermabond.  The patient tolerated this procedure well.   COMPLICATIONS: None  CONDITION: Stable   Monica Martinez, MD [facility] Office: [phone] Pager: [phone]  02/18/2019, 8:26 AM

## 2019-02-19 ENCOUNTER — Encounter (HOSPITAL_COMMUNITY): Payer: Self-pay | Admitting: Vascular Surgery

## 2019-02-21 ENCOUNTER — Other Ambulatory Visit: Payer: Self-pay

## 2019-02-21 ENCOUNTER — Encounter (HOSPITAL_COMMUNITY)
Admission: RE | Admit: 2019-02-21 | Discharge: 2019-02-21 | Disposition: A | Discharge status: Home / Self Care | Payer: 59 | Source: Ambulatory Visit | Attending: Nephrology | Admitting: Nephrology

## 2019-02-21 VITALS — BP 109/68 | HR 97 | Temp 97.2°F | Resp 18

## 2019-02-21 DIAGNOSIS — N189 Chronic kidney disease, unspecified: Secondary | ICD-10-CM | POA: Insufficient documentation

## 2019-02-21 DIAGNOSIS — Z862 Personal history of diseases of the blood and blood-forming organs and certain disorders involving the immune mechanism: Secondary | ICD-10-CM | POA: Insufficient documentation

## 2019-02-21 LAB — IRON AND TIBC
Iron: 112 ug/dL (ref 45–182)
Saturation Ratios: 43 % — ABNORMAL HIGH (ref 17.9–39.5)
TIBC: 258 ug/dL (ref 250–450)
UIBC: 146 ug/dL

## 2019-02-21 LAB — FERRITIN: Ferritin: 474 ng/mL — ABNORMAL HIGH (ref 24–336)

## 2019-02-21 MED ORDER — DARBEPOETIN ALFA 60 MCG/0.3ML IJ SOSY
60.0000 ug | PREFILLED_SYRINGE | INTRAMUSCULAR | Status: DC
  Administered 2019-02-21: 60 ug via SUBCUTANEOUS

## 2019-02-21 MED ORDER — DARBEPOETIN ALFA 60 MCG/0.3ML IJ SOSY
PREFILLED_SYRINGE | INTRAMUSCULAR | Status: AC
  Filled 2019-02-21: qty 0.3

## 2019-02-22 LAB — POCT HEMOGLOBIN-HEMACUE: Hemoglobin: 9.9 g/dL — ABNORMAL LOW (ref 13.0–17.0)

## 2019-03-21 ENCOUNTER — Ambulatory Visit (HOSPITAL_COMMUNITY)
Admission: RE | Admit: 2019-03-21 | Discharge: 2019-03-21 | Disposition: A | Discharge status: Home / Self Care | Payer: 59 | Source: Ambulatory Visit | Attending: Nephrology | Admitting: Nephrology

## 2019-03-21 ENCOUNTER — Other Ambulatory Visit: Payer: Self-pay

## 2019-03-21 VITALS — BP 139/75 | HR 85 | Temp 97.3°F | Resp 18

## 2019-03-21 DIAGNOSIS — Z862 Personal history of diseases of the blood and blood-forming organs and certain disorders involving the immune mechanism: Secondary | ICD-10-CM

## 2019-03-21 DIAGNOSIS — N189 Chronic kidney disease, unspecified: Secondary | ICD-10-CM | POA: Insufficient documentation

## 2019-03-21 LAB — CBC WITH DIFFERENTIAL/PLATELET
Abs Immature Granulocytes: 0.02 10*3/uL (ref 0.00–0.07)
Basophils Absolute: 0.1 10*3/uL (ref 0.0–0.1)
Basophils Relative: 2 %
Eosinophils Absolute: 0.4 10*3/uL (ref 0.0–0.5)
Eosinophils Relative: 8 %
HCT: 28.6 % — ABNORMAL LOW (ref 39.0–52.0)
Hemoglobin: 9.5 g/dL — ABNORMAL LOW (ref 13.0–17.0)
Immature Granulocytes: 1 %
Lymphocytes Relative: 30 %
Lymphs Abs: 1.3 10*3/uL (ref 0.7–4.0)
MCH: 30.2 pg (ref 26.0–34.0)
MCHC: 33.2 g/dL (ref 30.0–36.0)
MCV: 90.8 fL (ref 80.0–100.0)
Monocytes Absolute: 0.6 10*3/uL (ref 0.1–1.0)
Monocytes Relative: 13 %
Neutro Abs: 2.1 10*3/uL (ref 1.7–7.7)
Neutrophils Relative %: 46 %
Platelets: 138 10*3/uL — ABNORMAL LOW (ref 150–400)
RBC: 3.15 MIL/uL — ABNORMAL LOW (ref 4.22–5.81)
RDW: 13.5 % (ref 11.5–15.5)
WBC: 4.4 10*3/uL (ref 4.0–10.5)
nRBC: 0 % (ref 0.0–0.2)

## 2019-03-21 LAB — COMPREHENSIVE METABOLIC PANEL
ALT: 45 U/L — ABNORMAL HIGH (ref 0–44)
AST: 27 U/L (ref 15–41)
Albumin: 3.5 g/dL (ref 3.5–5.0)
Alkaline Phosphatase: 85 U/L (ref 38–126)
Anion gap: 13 (ref 5–15)
BUN: 133 mg/dL — ABNORMAL HIGH (ref 6–20)
CO2: 18 mmol/L — ABNORMAL LOW (ref 22–32)
Calcium: 8.4 mg/dL — ABNORMAL LOW (ref 8.9–10.3)
Chloride: 106 mmol/L (ref 98–111)
Creatinine, Ser: 11.06 mg/dL — ABNORMAL HIGH (ref 0.61–1.24)
GFR calc Af Amer: 5 mL/min — ABNORMAL LOW (ref >60)
GFR calc non Af Amer: 5 mL/min — ABNORMAL LOW (ref >60)
Glucose, Bld: 104 mg/dL — ABNORMAL HIGH (ref 70–99)
Potassium: 4.1 mmol/L (ref 3.5–5.1)
Sodium: 137 mmol/L (ref 135–145)
Total Bilirubin: 0.6 mg/dL (ref 0.3–1.2)
Total Protein: 6 g/dL — ABNORMAL LOW (ref 6.5–8.1)

## 2019-03-21 LAB — URINALYSIS, ROUTINE W REFLEX MICROSCOPIC
Bacteria, UA: NONE SEEN
Bilirubin Urine: NEGATIVE
Glucose, UA: NEGATIVE mg/dL
Hgb urine dipstick: NEGATIVE
Ketones, ur: NEGATIVE mg/dL
Leukocytes,Ua: NEGATIVE
Nitrite: NEGATIVE
Protein, ur: 100 mg/dL — AB
Specific Gravity, Urine: 1.01 (ref 1.005–1.030)
pH: 6 (ref 5.0–8.0)

## 2019-03-21 LAB — PROTEIN / CREATININE RATIO, URINE
Creatinine, Urine: 69.4 mg/dL
Protein Creatinine Ratio: 2.75 mg/mg{Cre} — ABNORMAL HIGH (ref 0.00–0.15)
Total Protein, Urine: 191 mg/dL

## 2019-03-21 LAB — PHOSPHORUS: Phosphorus: 5.6 mg/dL — ABNORMAL HIGH (ref 2.5–4.6)

## 2019-03-21 LAB — POCT HEMOGLOBIN-HEMACUE: Hemoglobin: 9.8 g/dL — ABNORMAL LOW (ref 13.0–17.0)

## 2019-03-21 MED ORDER — DARBEPOETIN ALFA 60 MCG/0.3ML IJ SOSY
PREFILLED_SYRINGE | INTRAMUSCULAR | Status: AC
  Filled 2019-03-21: qty 0.3

## 2019-03-21 MED ORDER — DARBEPOETIN ALFA 60 MCG/0.3ML IJ SOSY
60.0000 ug | PREFILLED_SYRINGE | INTRAMUSCULAR | Status: DC
  Administered 2019-03-21: 60 ug via SUBCUTANEOUS

## 2019-03-22 LAB — PTH, INTACT AND CALCIUM
Calcium, Total (PTH): 8.2 mg/dL — ABNORMAL LOW (ref 8.7–10.2)
PTH: 277 pg/mL — ABNORMAL HIGH (ref 15–65)

## 2019-04-18 ENCOUNTER — Encounter (HOSPITAL_COMMUNITY): Payer: 59

## 2019-04-18 ENCOUNTER — Encounter: Payer: Self-pay | Admitting: Gastroenterology

## 2019-05-11 ENCOUNTER — Telehealth (HOSPITAL_COMMUNITY): Payer: Self-pay

## 2019-05-11 ENCOUNTER — Other Ambulatory Visit: Payer: Self-pay

## 2019-05-11 DIAGNOSIS — N185 Chronic kidney disease, stage 5: Secondary | ICD-10-CM

## 2019-05-11 NOTE — Telephone Encounter (Signed)

## 2019-05-12 ENCOUNTER — Other Ambulatory Visit: Payer: Self-pay

## 2019-05-12 ENCOUNTER — Ambulatory Visit (INDEPENDENT_AMBULATORY_CARE_PROVIDER_SITE_OTHER): Payer: Self-pay | Admitting: Physician Assistant

## 2019-05-12 ENCOUNTER — Ambulatory Visit (HOSPITAL_COMMUNITY)
Admission: RE | Admit: 2019-05-12 | Discharge: 2019-05-12 | Disposition: A | Discharge status: Home / Self Care | Payer: 59 | Source: Ambulatory Visit | Attending: Surgery | Admitting: Surgery

## 2019-05-12 DIAGNOSIS — N185 Chronic kidney disease, stage 5: Secondary | ICD-10-CM | POA: Insufficient documentation

## 2019-05-12 DIAGNOSIS — Z992 Dependence on renal dialysis: Secondary | ICD-10-CM

## 2019-05-12 DIAGNOSIS — N186 End stage renal disease: Secondary | ICD-10-CM

## 2019-05-12 NOTE — Progress Notes (Signed)
    Postoperative Access Visit   History of Present Illness   [name] is a 54 y.o. year old male who presents for postoperative follow-up for: right radiocephalic arteriovenous fistula (Date: 02/18/19).  The patient's wounds are healed.  The patient denies steal symptoms.  The patient is able to complete their activities of daily living.  He is dialyzing via R IJ TDC on MWF schedule at [facility] on [address].  Physical Examination   Vitals:   05/12/19 1435  BP: 112/66  Pulse: 67  Resp: 20  Temp: 98 F (36.7 C)  SpO2: 99%  Weight: 190 lb (86.2 kg)  Height: 5\' 8"  (1.727 m)   Body mass index is 28.89 kg/m.  right arm Incision is healed, hand grip is 5/5, sensation in digits is intact, palpable thrill, bruit can be auscultated     Medical Decision Making   [name] is a 54 y.o. year old male who presents s/p right radiocephalic arteriovenous fistula   Patent radiocephalic without signs or symptoms of steal syndrome  The patient's access is ready for use and fistula was marked on his arm  The patient's tunneled dialysis catheter can be removed when Nephrology is comfortable with the performance of the fistula  The patient may follow up on a prn basis   Dagoberto Ligas PA-C [facility] Office: [phone]  Clinic MD: Scot Dock

## 2019-05-16 ENCOUNTER — Encounter (HOSPITAL_COMMUNITY): Payer: 59

## 2019-05-18 NOTE — Progress Notes (Unsigned)
New pt paperwork

## 2019-05-26 ENCOUNTER — Encounter: Payer: Self-pay | Admitting: Gastroenterology

## 2019-05-26 ENCOUNTER — Other Ambulatory Visit: Payer: Self-pay

## 2019-05-26 ENCOUNTER — Ambulatory Visit (INDEPENDENT_AMBULATORY_CARE_PROVIDER_SITE_OTHER): Payer: 59 | Admitting: Gastroenterology

## 2019-05-26 VITALS — BP 96/58 | HR 67 | Temp 97.2°F | Ht 68.0 in | Wt 193.0 lb

## 2019-05-26 DIAGNOSIS — Z9884 Bariatric surgery status: Secondary | ICD-10-CM | POA: Diagnosis not present

## 2019-05-26 DIAGNOSIS — Z01818 Encounter for other preprocedural examination: Secondary | ICD-10-CM

## 2019-05-26 DIAGNOSIS — D509 Iron deficiency anemia, unspecified: Secondary | ICD-10-CM

## 2019-05-26 DIAGNOSIS — Z1211 Encounter for screening for malignant neoplasm of colon: Secondary | ICD-10-CM

## 2019-05-26 MED ORDER — PLENVU 140 G PO SOLR: 1.0000 | Freq: Once | ORAL | 0 refills | Status: AC

## 2019-05-26 NOTE — Patient Instructions (Signed)
If you are age 55 or older, your body mass index should be between 23-30. Your Body mass index is 29.35 kg/m. If this is out of the aforementioned range listed, please consider follow up with your Primary Care Provider.  If you are age 63 or younger, your body mass index should be between 19-25. Your Body mass index is 29.35 kg/m. If this is out of the aformentioned range listed, please consider follow up with your Primary Care Provider.   You have been scheduled for an endoscopy and colonoscopy. Please follow the written instructions given to you at your visit today. Please pick up your prep supplies at the pharmacy within the next 1-3 days. If you use inhalers (even only as needed), please bring them with you on the day of your procedure. Your physician has requested that you go to www.startemmi.com and enter the access code given to you at your visit today. This web site gives a general overview about your procedure. However, you should still follow specific instructions given to you by our office regarding your preparation for the procedure.  We are giving you a Plenvu sample today to use for your prep.  Thank you for entrusting me with your care and for choosing [facility], Dr. White Cellar

## 2019-05-26 NOTE — Progress Notes (Signed)
HPI :  54 year old man with a history of end-stage renal disease on dialysis, history of obesity status post gastric bypass, history of iron deficiency, referred by Donato Heinz MD for discussion of colonoscopy and iron deficiency.  Patient is going through the process to be listed for a kidney transplant.  As part of this evaluation he warrants a colonoscopy that is up-to-date.  He denies any troubles with his bowels at all.  No constipation or diarrhea.  He denies any blood in his stools.  He denies any abdominal pains.  He has never had a prior colonoscopy.  He had a history of gastric bypass in March 2019.  He is lost a significant mount of weight, his BMI is currently 29.  He denies any postprandial symptoms.  No problems eating, no dysphagia, no reflux symptoms.  No postprandial abdominal pain.  He denies any history of NSAID use.  He thinks he had an EGD remotely but we do not of the records of that.  He does have a history of anemia.  Hemoglobin of 9.1 with MCV of 87 most recent labs in November 2020.  Patient states he has had multiple iron infusions for problems with anemia and iron deficiency.  I do not see any labs regarding his iron studies on hand today.  He has not had endoscopic evaluation for his iron deficiency to this point.  He does not take any antacids at this time.  Does take a baby aspirin.  No other blood thinners that he is taking.  He otherwise feels well today without complaints. No cardiopulmonary symptoms.  He has dialysis Monday Wednesday and Fridays.  Echocardiogram [facility] - 03/02/19 - EF 60-65%  Past Medical History:  Diagnosis Date  . Anemia    iron infusions in last few months for anemia   . Arthritis    ankles   . Chronic kidney disease    followed by Dr Arty Baumgartner   . Diabetes mellitus without complication ([place])    type II   . Gout   . History of anemia due to CKD 12/22/2018  . Hyperparathyroidism ([place])   . Hypertension   . Obesity   . PVC  (premature ventricular contraction) 02/16/2018  . Sinus congestion    all the time  . Sleep apnea    cpap off and on      Past Surgical History:  Procedure Laterality Date  . AV FISTULA PLACEMENT Right 02/18/2019   Procedure: ARTERIOVENOUS (AV) FISTULA CREATION RIGHT ARM;  Surgeon: Marty Heck, MD;  Location: [place];  Service: Vascular;  Laterality: Right;  . 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 NASAL SEPTUM SURGERY    . QUADRICEPS TENDON REPAIR  05/13/2012   Procedure: REPAIR QUADRICEP TENDON;  Surgeon: Augustin Schooling, MD;  Location: [place];  Service: Orthopedics;  Laterality: Right;  . UMBILICAL HERNIA REPAIR N/A 07/09/2017   Procedure: LAPAROSCOPIC UMBILICAL HERNIA WITH MESH;  Surgeon: [name], [name], MD;  Location: [facility];  Service: General;  Laterality: N/A;   Family History  Problem Relation Age of Onset  . Hypertension Other   . Hypertension Mother   . Diabetes Mother   . Cataracts Father   . Colon cancer Neg Hx   . Esophageal cancer Neg Hx   . Stomach cancer Neg Hx   . Pancreatic cancer Neg  Hx    Social History   Tobacco Use  . Smoking status: Never Smoker  . Smokeless tobacco: Never Used  Substance Use Topics  . Alcohol use: Not Currently  . Drug use: No   Current Outpatient Medications  Medication Sig Dispense Refill  . acetaminophen (TYLENOL) 500 MG tablet Take 1,000 mg by mouth every 6 (six) hours as needed (for pain.).    Marland Kitchen allopurinol (ZYLOPRIM) 100 MG tablet Take 100 mg by mouth daily.    Marland Kitchen amLODipine (NORVASC) 10 MG tablet Take 10 mg by mouth daily.    Marland Kitchen aspirin 81 MG chewable tablet Chew 1 tablet (81 mg total) by mouth daily. 1 tablet   . AURYXIA 1 GM 210 MG(Fe) tablet Take 420 mg by mouth 3 (three) times daily with meals.     . Calcium Carb-Cholecalciferol (CALCIUM + D3 PO) Take  1 tablet by mouth 3 (three) times daily.    . carvedilol (COREG) 12.5 MG tablet TAKE 1 TABLET BY MOUTH 2 TIMES DAILY. 180 tablet 3  . doxazosin (CARDURA) 4 MG tablet Take 4 mg by mouth at bedtime.     . furosemide (LASIX) 40 MG tablet Take 40 mg by mouth every other day.    . iron sucrose in sodium chloride 0.9 % 100 mL Iron Sucrose (Venofer)    . isosorbide-hydrALAZINE (BIDIL) 20-37.5 MG tablet Take 2 tablets by mouth 3 (three) times daily.    Marland Kitchen oxyCODONE-acetaminophen (PERCOCET/ROXICET) 5-325 MG tablet Take 1 tablet by mouth every 6 (six) hours as needed. 6 tablet 0   No current facility-administered medications for this visit.   No Known Allergies   Review of Systems: All systems reviewed and negative except where noted in HPI.   Lab Results  Component Value Date   WBC 4.4 03/21/2019   HGB 9.8 (L) 03/21/2019   HCT 28.6 (L) 03/21/2019   MCV 90.8 03/21/2019   PLT 138 (L) 03/21/2019    Lab Results  Component Value Date   CREATININE 11.06 (H) 03/21/2019   BUN 133 (H) 03/21/2019   NA 137 03/21/2019   K 4.1 03/21/2019   CL 106 03/21/2019   CO2 18 (L) 03/21/2019    Lab Results  Component Value Date   ALT 45 (H) 03/21/2019   AST 27 03/21/2019   ALKPHOS 85 03/21/2019   BILITOT 0.6 03/21/2019    No results found for: INR, PROTIME Lab Results  Component Value Date   IRON 112 02/21/2019   TIBC 258 02/21/2019   FERRITIN 474 (H) 02/21/2019     Physical Exam: BP (!) 96/58 (BP Location: Left Arm, Patient Position: Sitting, Cuff Size: Normal)   Pulse 67   Temp (!) 97.2 F (36.2 C)   Ht 5\' 8"  (1.727 m)   Wt 193 lb (87.5 kg)   SpO2 99%   BMI 29.35 kg/m  Constitutional: Pleasant,well-developed, male in no acute distress. HEENT: Normocephalic and atraumatic. Conjunctivae are normal. No scleral icterus. Neck supple.  Cardiovascular: Normal rate, regular rhythm. 2/6 SEM Pulmonary/chest: Effort normal and breath sounds normal. No wheezing, rales or rhonchi. Abdominal:  Soft, nondistended, nontender.There are no masses palpable.  Extremities: (+) 1-2 LE edema Lymphadenopathy: No cervical adenopathy noted. Neurological: Alert and oriented to person place and time. Skin: Skin is warm and dry. No rashes noted. Psychiatric: Normal mood and affect. Behavior is normal.   ASSESSMENT AND PLAN: 54 y/o male here for new patient assessment of the following:  Iron deficiency anemia / gastric bypass / colon  cancer screening - patient is hoping to be listed for renal transplant in the near future and requires an up-to-date colonoscopy for that, he has had no prior screening.  He otherwise has ongoing anemia with a component of iron deficiency, leading to IV iron infusions in the past.  He has no overt GI bleeding.  He does have a history of gastric bypass which can be a cause of iron deficiency however he warrants upper endoscopy and colonoscopy to further evaluate his iron deficiency and to clear his colon for his candidacy for renal transplant.  I discussed endoscopy and colonoscopy with him, risks and benefits of the exam and that of anesthesia, and he wanted to proceed.  This will be scheduled to be done in the next week or 2 and will make further recommendations pending the results of this exam.  He agreed with the plan, all questions answered  I spent 50 minutes of time, including in depth chart review, independent review of results as outlined above, communicating results with the patient directly, face-to-face time with the patient, coordinating care, ordering studies and medications as appropriate, and documenting this encounter.   [place] Cellar, MD [facility]  CC: Donato Heinz, MD

## 2019-05-27 ENCOUNTER — Ambulatory Visit (INDEPENDENT_AMBULATORY_CARE_PROVIDER_SITE_OTHER): Payer: Self-pay

## 2019-05-27 DIAGNOSIS — Z1159 Encounter for screening for other viral diseases: Secondary | ICD-10-CM

## 2019-05-30 LAB — SARS CORONAVIRUS 2 (TAT 6-24 HRS): SARS Coronavirus 2: NEGATIVE

## 2019-05-31 ENCOUNTER — Ambulatory Visit (AMBULATORY_SURGERY_CENTER): Payer: 59 | Admitting: Gastroenterology

## 2019-05-31 ENCOUNTER — Other Ambulatory Visit: Payer: Self-pay

## 2019-05-31 ENCOUNTER — Encounter: Payer: Self-pay | Admitting: Gastroenterology

## 2019-05-31 VITALS — BP 157/70 | HR 57 | Temp 97.3°F | Resp 9 | Ht 68.0 in | Wt 193.0 lb

## 2019-05-31 DIAGNOSIS — D120 Benign neoplasm of cecum: Secondary | ICD-10-CM | POA: Diagnosis not present

## 2019-05-31 DIAGNOSIS — D508 Other iron deficiency anemias: Secondary | ICD-10-CM

## 2019-05-31 DIAGNOSIS — K5730 Diverticulosis of large intestine without perforation or abscess without bleeding: Secondary | ICD-10-CM

## 2019-05-31 DIAGNOSIS — K2970 Gastritis, unspecified, without bleeding: Secondary | ICD-10-CM

## 2019-05-31 DIAGNOSIS — K648 Other hemorrhoids: Secondary | ICD-10-CM | POA: Diagnosis not present

## 2019-05-31 DIAGNOSIS — K229 Disease of esophagus, unspecified: Secondary | ICD-10-CM

## 2019-05-31 DIAGNOSIS — K2950 Unspecified chronic gastritis without bleeding: Secondary | ICD-10-CM

## 2019-05-31 DIAGNOSIS — B9681 Helicobacter pylori [H. pylori] as the cause of diseases classified elsewhere: Secondary | ICD-10-CM

## 2019-05-31 MED ORDER — SODIUM CHLORIDE 0.9 % IV SOLN: 500.0000 mL | Freq: Once | INTRAVENOUS | Status: DC

## 2019-05-31 NOTE — Op Note (Signed)
[facility] Patient Name: [name] Procedure Date: 05/31/2019 10:18 AM MRN: [id number] Endoscopist: Remo Lipps P. Havery Moros , MD Age: 54 Referring MD:  Date of Birth: [birth year] Gender: Male Account #: [id number] Procedure:                Upper GI endoscopy Indications:              multifactorial anemia (ESRD on HD) with component                            of Iron deficiency anemia, history of Roux-en-Y                            gastric bypass Medicines:                Monitored Anesthesia Care Procedure:                Pre-Anesthesia Assessment:                           - Prior to the procedure, a History and Physical                            was performed, and patient medications and                            allergies were reviewed. The patient's tolerance of                            previous anesthesia was also reviewed. The risks                            and benefits of the procedure and the sedation                            options and risks were discussed with the patient.                            All questions were answered, and informed consent                            was obtained. Prior Anticoagulants: The patient has                            taken no previous anticoagulant or antiplatelet                            agents. ASA Grade Assessment: III - A patient with                            severe systemic disease. After reviewing the risks                            and benefits, the patient was deemed in  satisfactory condition to undergo the procedure.                           After obtaining informed consent, the endoscope was                            passed under direct vision. Throughout the                            procedure, the patient's blood pressure, pulse, and                            oxygen saturations were monitored continuously. The                            Endoscope was introduced through the  mouth, and                            advanced to the jejunum. The upper GI endoscopy was                            accomplished without difficulty. The patient                            tolerated the procedure well. Scope In: Scope Out: Findings:                 Esophagogastric landmarks were identified: the                            Z-line was found at 40 cm, the gastroesophageal                            junction was found at 40 cm and the upper extent of                            the gastric folds was found at 40 cm from the                            incisors.                           A single 5 to 6 mm subepithelial nodule was found                            in the lower third of the esophagus, 39 cm from the                            incisors. Biopsies were taken with a cold forceps                            for histology.  The exam of the esophagus was otherwise normal.                           Evidence of a Roux-en-Y gastrojejunostomy was                            found. The gastrojejunal anastomosis was                            characterized by a disrupted staple which was                            removed with cold forceps. No ulcerations noted at                            the anastomosis, which was otherwise healthy                            appearing.                           Erythematous mucosa was found in the gastric pouch.                            Biopsies were taken with a cold forceps for                            Helicobacter pylori testing. Retroflexed views not                            obtained given small size of the pouch.                           The examined small bowel limb and blind limbs were                            normal. Complications:            No immediate complications. Estimated blood loss:                            Minimal. Estimated Blood Loss:     Estimated blood loss was minimal. Impression:                - Esophagogastric landmarks identified.                           - Small benign appearing subepithelial nodule found                            in the distal esophagus esophagus. Biopsied.                           - Roux-en-Y gastrojejunostomy with gastrojejunal  anastomosis characterized by a disrupted staple,                            which was removed with cold forceps.                           - Erythematous mucosa in the gastric pouch.                            Biopsied.                           - Normal examined small bowel limb. Recommendation:           - Patient has a contact number available for                            emergencies. The signs and symptoms of potential                            delayed complications were discussed with the                            patient. Return to normal activities tomorrow.                            Written discharge instructions were provided to the                            patient.                           - Resume previous diet.                           - Continue present medications.                           - Await pathology results. Remo Lipps P. Xaden Kaufman, MD 05/31/2019 11:18:07 AM This report has been signed electronically.

## 2019-05-31 NOTE — Progress Notes (Signed)
No problems noted in the recovery room. maw 

## 2019-05-31 NOTE — Progress Notes (Signed)
Pt's states no medical or surgical changes since previsit or office visit. 

## 2019-05-31 NOTE — Progress Notes (Signed)
A/ox3, pleased with MAC, report to RN 

## 2019-05-31 NOTE — Progress Notes (Signed)
Patient has fistula to right arm.also to right upper chest wall double lumen catheter.

## 2019-05-31 NOTE — Patient Instructions (Addendum)
YOU HAD AN ENDOSCOPIC PROCEDURE TODAY AT THE [facility]:   Refer to the procedure report that was given to you for any specific questions about what was found during the examination.  If the procedure report does not answer your questions, please call your gastroenterologist to clarify.  If you requested that your care partner not be given the details of your procedure findings, then the procedure report has been included in a sealed envelope for you to review at your convenience later.  YOU SHOULD EXPECT: Some feelings of bloating in the abdomen. Passage of more gas than usual.  Walking can help get rid of the air that was put into your GI tract during the procedure and reduce the bloating. If you had a lower endoscopy (such as a colonoscopy or flexible sigmoidoscopy) you may notice spotting of blood in your stool or on the toilet paper. If you underwent a bowel prep for your procedure, you may not have a normal bowel movement for a few days.  Please Note:  You might notice some irritation and congestion in your nose or some drainage.  This is from the oxygen used during your procedure.  There is no need for concern and it should clear up in a day or so.  SYMPTOMS TO REPORT IMMEDIATELY:   Following lower endoscopy (colonoscopy or flexible sigmoidoscopy):  Excessive amounts of blood in the stool  Significant tenderness or worsening of abdominal pains  Swelling of the abdomen that is new, acute  Fever of 100F or higher   Following upper endoscopy (EGD)  Vomiting of blood or coffee ground material  New chest pain or pain under the shoulder blades  Painful or persistently difficult swallowing  New shortness of breath  Fever of 100F or higher  Black, tarry-looking stools  For urgent or emergent issues, a gastroenterologist can be reached at any hour by calling [phone].   DIET:  We do recommend a small meal at first, but then you may proceed to your regular diet.  Drink  plenty of fluids but you should avoid alcoholic beverages for 24 hours.  ACTIVITY:  You should plan to take it easy for the rest of today and you should NOT DRIVE or use heavy machinery until tomorrow (because of the sedation medicines used during the test).    FOLLOW UP: Our staff will call the number listed on your records 48-72 hours following your procedure to check on you and address any questions or concerns that you may have regarding the information given to you following your procedure. If we do not reach you, we will leave a message.  We will attempt to reach you two times.  During this call, we will ask if you have developed any symptoms of COVID 19. If you develop any symptoms (ie: fever, flu-like symptoms, shortness of breath, cough etc.) before then, please call [phone].  If you test positive for Covid 19 in the 2 weeks post procedure, please call and report this information to us.    If any biopsies were taken you will be contacted by phone or by letter within the next 1-3 weeks.  Please call us at [phone] if you have not heard about the biopsies in 3 weeks.    SIGNATURES/CONFIDENTIALITY: You and/or your care partner have signed paperwork which will be entered into your electronic medical record.  These signatures attest to the fact that that the information above on your After Visit Summary has been reviewed and is   understood.  Full responsibility of the confidentiality of this discharge information lies with you and/or your care-partner.     Handouts were given to on gastritis, polyps, diverticulosis, and hemorrhoids. Your blood sugar was 93 in the recovery room. You may resume your other current medications today. Await biopsy results. Please call if any questions or concerns.

## 2019-05-31 NOTE — Op Note (Signed)
[place] Patient Name: [name] Procedure Date: 05/31/2019 10:17 AM MRN: [id number] Endoscopist: [name] , MD Age: 54 Referring MD:  Date of Birth: 1966-01-08 Gender: Male Account #: [id number] Procedure:                Colonoscopy Indications:              multifactorial anemia with component of iron                            deficiency leading to IV iron infusions in the                            past, no prior colonoscopy, patient in need of                            colonoscopy for renal transplant listing Medicines:                Monitored Anesthesia Care Procedure:                Pre-Anesthesia Assessment:                           - Prior to the procedure, a History and Physical                            was performed, and patient medications and                            allergies were reviewed. The patient's tolerance of                            previous anesthesia was also reviewed. The risks                            and benefits of the procedure and the sedation                            options and risks were discussed with the patient.                            All questions were answered, and informed consent                            was obtained. Prior Anticoagulants: The patient has                            taken no previous anticoagulant or antiplatelet                            agents. ASA Grade Assessment: III - A patient with                            severe systemic disease. After reviewing the risks  and benefits, the patient was deemed in                            satisfactory condition to undergo the procedure.                           After obtaining informed consent, the colonoscope                            was passed under direct vision. Throughout the                            procedure, the patient's blood pressure, pulse, and                            oxygen saturations were  monitored continuously. The                            Colonoscope was introduced through the anus and                            advanced to the the terminal ileum, with                            identification of the appendiceal orifice and IC                            valve. The colonoscopy was performed without                            difficulty. The patient tolerated the procedure                            well. The quality of the bowel preparation was                            good. The terminal ileum, ileocecal valve,                            appendiceal orifice, and rectum were photographed. Scope In: 10:42:06 AM Scope Out: 11:05:20 AM Scope Withdrawal Time: 0 hours 20 minutes 5 seconds  Total Procedure Duration: 0 hours 23 minutes 14 seconds  Findings:                 The perianal and digital rectal examinations were                            normal.                           The terminal ileum contained a few small                            angiodysplastic lesions.  The remainder of the exam in the terminal ileum was                            normal.                           A 3 mm polyp was found in the cecum. The polyp was                            flat. The polyp was removed with a cold snare.                            Resection and retrieval were complete.                           Scattered small-mouthed diverticula were found in                            the entire colon.                           Internal hemorrhoids were found during retroflexion.                           The exam was otherwise without abnormality. Complications:            No immediate complications. Estimated blood loss:                            Minimal. Estimated Blood Loss:     Estimated blood loss was minimal. 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                           - One 3 mm polyp in the cecum, removed with a  cold                            snare. Resected and retrieved.                           - Diverticulosis in the entire examined colon.                           - Internal hemorrhoids.                           - The examination was otherwise normal.                           Possible that small bowel AVMs could be                            contributing to anemia. No colonic findings that  would cause anemia. Recommendation:           - Patient has a contact number available for                            emergencies. The signs and symptoms of potential                            delayed complications were discussed with the                            patient. Return to normal activities tomorrow.                            Written discharge instructions were provided to the                            patient.                           - Resume previous diet.                           - Continue present medications.                           - Await pathology results. Kevin Lipps P. Kevin Corey, MD 05/31/2019 11:12:25 AM This report has been signed electronically.

## 2019-05-31 NOTE — Progress Notes (Signed)
Called to room to assist during endoscopic procedure.  Patient ID and intended procedure confirmed with present staff. Received instructions for my participation in the procedure from the performing physician.  

## 2019-06-02 ENCOUNTER — Telehealth: Payer: Self-pay | Admitting: *Deleted

## 2019-06-02 ENCOUNTER — Telehealth: Payer: Self-pay

## 2019-06-02 NOTE — Telephone Encounter (Signed)
Second call for follow up to pt, quick busy, unable to leave message

## 2019-06-02 NOTE — Telephone Encounter (Signed)
  Follow up Call-  Call back number 05/31/2019  Post procedure Call Back phone  # [phone]  Permission to leave phone message Yes  Some recent data might be hidden     Patient questions:  Number is busy.

## 2019-06-09 ENCOUNTER — Telehealth: Payer: Self-pay

## 2019-06-09 ENCOUNTER — Other Ambulatory Visit: Payer: Self-pay

## 2019-06-09 DIAGNOSIS — B9681 Helicobacter pylori [H. pylori] as the cause of diseases classified elsewhere: Secondary | ICD-10-CM

## 2019-06-09 DIAGNOSIS — K2970 Gastritis, unspecified, without bleeding: Secondary | ICD-10-CM

## 2019-06-09 MED ORDER — OMEPRAZOLE 40 MG PO CPDR: 40.0000 mg | DELAYED_RELEASE_CAPSULE | Freq: Two times a day (BID) | ORAL | 0 refills | Status: DC

## 2019-06-09 MED ORDER — METRONIDAZOLE 500 MG PO TABS: 500.0000 mg | ORAL_TABLET | Freq: Two times a day (BID) | ORAL | 0 refills | Status: DC

## 2019-06-09 MED ORDER — CLARITHROMYCIN 500 MG PO TABS: 500.0000 mg | ORAL_TABLET | Freq: Two times a day (BID) | ORAL | 0 refills | Status: DC

## 2019-06-09 MED ORDER — AMOXICILLIN 500 MG PO TABS: 1000.0000 mg | ORAL_TABLET | Freq: Two times a day (BID) | ORAL | 0 refills | Status: DC

## 2019-06-09 NOTE — Telephone Encounter (Signed)
Attempted to reach patient by phone several times. It continues to ring busy. I will try later

## 2019-06-14 ENCOUNTER — Telehealth: Payer: Self-pay

## 2019-06-14 NOTE — Telephone Encounter (Signed)
-----   Message from Yetta Flock, MD sent at 06/14/2019  1:02 PM EST ----- Sherlynn Stalls can you please let the patient know I spoke about his case with my advanced endoscopy colleagues. Will hold off on EUS right now given the small size of the lesion. Would plan on repeating an EGD within 6-12 months to reassess it's size. If it enlarges would certainly warrant an EUS but suspect this is a benign small growth that is more than likely not to cause any problems in the future, but will ensure this with surveillance EGD.  Early Chars, please place a recall for repeat EGD in 6-12 months. Thanks

## 2019-06-19 HISTORY — PX: UPPER GASTROINTESTINAL ENDOSCOPY: SHX188

## 2019-06-19 HISTORY — PX: COLONOSCOPY: SHX174

## 2019-06-21 ENCOUNTER — Other Ambulatory Visit: Payer: Self-pay | Admitting: Cardiovascular Disease

## 2019-09-15 ENCOUNTER — Other Ambulatory Visit: Payer: Self-pay | Admitting: Cardiovascular Disease

## 2019-11-15 ENCOUNTER — Other Ambulatory Visit: Payer: Self-pay | Admitting: Nephrology

## 2019-11-15 DIAGNOSIS — N63 Unspecified lump in unspecified breast: Secondary | ICD-10-CM

## 2019-11-15 DIAGNOSIS — N644 Mastodynia: Secondary | ICD-10-CM

## 2019-12-01 ENCOUNTER — Other Ambulatory Visit: Payer: Self-pay

## 2019-12-01 ENCOUNTER — Ambulatory Visit
Admission: RE | Admit: 2019-12-01 | Discharge: 2019-12-01 | Disposition: A | Discharge status: Home / Self Care | Payer: Medicare Other | Source: Ambulatory Visit | Attending: Nephrology | Admitting: Nephrology

## 2019-12-01 ENCOUNTER — Ambulatory Visit: Payer: 59

## 2019-12-01 DIAGNOSIS — N644 Mastodynia: Secondary | ICD-10-CM

## 2019-12-01 DIAGNOSIS — N63 Unspecified lump in unspecified breast: Secondary | ICD-10-CM

## 2019-12-15 ENCOUNTER — Other Ambulatory Visit: Payer: Self-pay | Admitting: Cardiovascular Disease

## 2020-01-04 ENCOUNTER — Other Ambulatory Visit: Payer: Self-pay | Admitting: Cardiovascular Disease

## 2020-01-06 ENCOUNTER — Encounter: Payer: Self-pay | Admitting: Gastroenterology

## 2020-01-12 ENCOUNTER — Encounter: Payer: 59 | Attending: General Surgery | Admitting: Registered"

## 2020-01-12 ENCOUNTER — Encounter: Payer: Self-pay | Admitting: Registered"

## 2020-01-12 ENCOUNTER — Other Ambulatory Visit: Payer: Self-pay

## 2020-01-12 DIAGNOSIS — E669 Obesity, unspecified: Secondary | ICD-10-CM | POA: Diagnosis not present

## 2020-01-12 NOTE — Progress Notes (Signed)
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  Non scale victories: increased mobility, reduced diabetes medications, more energy, wearing smaller clothes, more room in booths when eating out improved knee and ankle pain  Surgery date: 07/07/2017 Surgery type: RYGB Start weight at [facility]: 338.2 Weight today: 191.3 lbs Weight change: 14 lbs loss from 205.3 (01/06/2019) Total weight lost: 146.9 lbs Weight loss goal: increase energy, improve knee and ankle pain, reduce medications   TANITA Results: Pt consistently declines using TANITA scale post-surgery. Uses regular scale instead.   Used TANITA scale today due to regular scale not working.   States he began dialysis in Dec 2020; attends MWF for 4 hours. States it takes a lot of out him. States he had to stop working. States its been a while since he has seen Psychologist, sport and exercise. RD reminded pt to stay connected to bariatric surgeon for annual appointments.   Reports BS are good. States it very rarely drops. Reports checking BS every other day. FBS (110-120). States he is not trying to lose weight. Weighs daily to make sure he is staying within fluid restrictions related to dialysis; recommended less than 32 ounces fluid a day. Pt states this is hard for him because he is used to drinking 100+ oz a day.    States he has first meal around 12 pm since starting dialysis. States he makes sure he is getting 80 g of protein daily; pt is eating first meal after 12 pm. States he checks BP before taking BP medications. If its less than 283 systolic BP then he does not to take BP medications.   Preferred Learning Style:   No preference indicated   Learning Readiness:   Ready  Change in progress  24-hr recall:  B (AM): hard shell taco (beef, lettuce, tomato) Snk (AM):  L (2 PM):  grilled chicken  breast sandwich (21-28g)  Snk (PM): watermelon D (8 PM):  grilled chicken breast sandwich (21-28g)  Snk (PM): none  Fluid intake: water (3*16 oz; 48 oz); 48 ounces Estimated total protein intake: 80+ grams  Medications: See list; takes glimipiride Supplementation: [facility] capsules + 3 calcium   CBG monitoring: yes, daily Average CBG per patient:  FBS 110-120 Last patient reported A1c: 9.6 (11/19/2017)  Using straws: no Drinking while eating: no Having you been chewing well: yes Chewing/swallowing difficulties: no Changes in vision: no Changes to mood/headaches: no Hair loss/Changes to skin/Changes to nails: no, no, no Any difficulty focusing or concentrating: no Sweating: no Dizziness/Lightheaded: no Palpitations: no  Carbonated beverages: sometimes clear soda; can tolerate well N/V/D/C/GAS: no, no, no, no, no Abdominal Pain: no Dumping syndrome: no Last Lap-Band fill: N/A  Recent physical activity:  Walking dog 30-45 min, 7 days/week and biking for 1 mile, 3x/week, walks golf course (9 holes) once every week or 2 weeks  Progress Towards Goal(s):  In progress.  Handouts given during visit include:  None    Nutritional Diagnosis:  [place]-3.3 Overweight/obesity related to past poor dietary habits and physical inactivity as evidenced by patient w/ recent RYGB surgery following dietary guidelines for continued weight loss.     Intervention:  Nutrition education and counseling. Pt was encouraged to keep up the great work. Recommended pt to have A1c checked to know where he stands with diabetes being controlled. Pt was in agreement with goal.  Goals: - Check updated A1c value  Teaching Method Utilized:  Visual Auditory Hands on  Barriers to learning/adherence to lifestyle change: none identified  Demonstrated degree of understanding via:  Teach Back   Monitoring/Evaluation:  Dietary intake, exercise, lap band fills, and body weight. Pt states he will follow-up  prn.

## 2020-01-12 NOTE — Patient Instructions (Signed)
-   Check updated A1c value

## 2020-01-13 ENCOUNTER — Other Ambulatory Visit: Payer: Self-pay | Admitting: Cardiovascular Disease

## 2020-01-15 ENCOUNTER — Other Ambulatory Visit: Payer: Self-pay

## 2020-01-15 ENCOUNTER — Ambulatory Visit (HOSPITAL_COMMUNITY)
Admission: EM | Admit: 2020-01-15 | Discharge: 2020-01-15 | Disposition: A | Discharge status: Home / Self Care | Payer: 59 | Attending: Internal Medicine | Admitting: Internal Medicine

## 2020-01-15 ENCOUNTER — Encounter (HOSPITAL_COMMUNITY): Payer: Self-pay | Admitting: Emergency Medicine

## 2020-01-15 DIAGNOSIS — Z20822 Contact with and (suspected) exposure to covid-19: Secondary | ICD-10-CM | POA: Insufficient documentation

## 2020-01-15 DIAGNOSIS — R05 Cough: Secondary | ICD-10-CM | POA: Insufficient documentation

## 2020-01-15 DIAGNOSIS — R0981 Nasal congestion: Secondary | ICD-10-CM | POA: Insufficient documentation

## 2020-01-15 DIAGNOSIS — Z01812 Encounter for preprocedural laboratory examination: Secondary | ICD-10-CM | POA: Insufficient documentation

## 2020-01-15 DIAGNOSIS — R059 Cough, unspecified: Secondary | ICD-10-CM

## 2020-01-15 DIAGNOSIS — B349 Viral infection, unspecified: Secondary | ICD-10-CM | POA: Insufficient documentation

## 2020-01-15 NOTE — ED Triage Notes (Signed)
Symptoms started 2 days ago.  Patient has taken theraflu.  Patient has a stuffy nose, cough, coughing up phlegm.  Initially generalized aching, no aching now

## 2020-01-15 NOTE — Discharge Instructions (Addendum)
Your COVID test is pending.  You should self quarantine until the test result is back.    Take Tylenol as needed for fever or discomfort.  Rest and keep yourself hydrated.    Go to the emergency department if you develop acute worsening symptoms.     

## 2020-01-15 NOTE — ED Provider Notes (Signed)
[place]   [id number] 01/15/20 Arrival Time: 1245   CC: COVID symptoms  SUBJECTIVE: History from: patient.  [name] is a 54 y.o. male who presents with abrupt onset of nasal congestion, PND, and persistent dry cough for the last 2 days. Denies sick exposure to COVID, flu or strep. Denies recent travel. Has negative history of Covid. Has completed Covid vaccines. Has taken theraflu with relief. There are no aggravating or alleviating factors. Denies previous symptoms in the past. Denies fever, chills, fatigue, sinus pain, sore throat, SOB, wheezing, chest pain, nausea, changes in bowel or bladder habits.    ROS: As per HPI.  All other pertinent ROS negative.     Past Medical History:  Diagnosis Date  . Anemia    iron infusions in last few months for anemia   . Arthritis    ankles   . Chronic kidney disease    followed by Dr Arty Baumgartner   . Diabetes mellitus without complication ([place])    type II   . Gout   . History of anemia due to CKD 12/22/2018  . Hyperparathyroidism ([place])   . Hypertension   . Obesity   . PVC (premature ventricular contraction) 02/16/2018  . Sinus congestion    all the time  . Sleep apnea    cpap off and on    Past Surgical History:  Procedure Laterality Date  . AV FISTULA PLACEMENT Right 02/18/2019   Procedure: ARTERIOVENOUS (AV) FISTULA CREATION RIGHT ARM;  Surgeon: Marty Heck, MD;  Location: [facility];  Service: Vascular;  Laterality: Right;  . 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 NASAL SEPTUM SURGERY    . QUADRICEPS TENDON REPAIR  05/13/2012   Procedure: REPAIR QUADRICEP TENDON;  Surgeon: Augustin Schooling, MD;  Location: [facility];  Service: Orthopedics;  Laterality: Right;  . UMBILICAL HERNIA REPAIR N/A 07/09/2017   Procedure: LAPAROSCOPIC UMBILICAL HERNIA WITH MESH;   Surgeon: [name], [name], MD;  Location: [facility];  Service: General;  Laterality: N/A;   No Known Allergies No current facility-administered medications on file prior to encounter.   Current Outpatient Medications on File Prior to Encounter  Medication Sig Dispense Refill  . acetaminophen (TYLENOL) 500 MG tablet Take 1,000 mg by mouth every 6 (six) hours as needed (for pain.).    Marland Kitchen allopurinol (ZYLOPRIM) 100 MG tablet Take 100 mg by mouth daily.    Marland Kitchen amLODipine (NORVASC) 10 MG tablet Take 10 mg by mouth daily.    Marland Kitchen amoxicillin (AMOXIL) 500 MG tablet Take 2 tablets (1,000 mg total) by mouth 2 (two) times daily. 56 tablet 0  . aspirin 81 MG chewable tablet Chew 1 tablet (81 mg total) by mouth daily. 1 tablet   . AURYXIA 1 GM 210 MG(Fe) tablet Take 420 mg by mouth 3 (three) times daily with meals.     . Calcium Carb-Cholecalciferol (CALCIUM + D3 PO) Take 1 tablet by mouth 3 (three) times daily.    . carvedilol (COREG) 12.5 MG tablet TAKE 1 TABLET (12.5 MG TOTAL) BY MOUTH 2 (TWO) TIMES DAILY. **NEEDS OFFICE VISIT**2ND ATTEMPT 30 tablet 1  . clarithromycin (BIAXIN) 500 MG tablet Take 1 tablet (500 mg total) by mouth 2 (two) times daily. 28 tablet 0  . doxazosin (CARDURA) 4 MG tablet Take 4 mg by mouth at bedtime.     Marland Kitchen  furosemide (LASIX) 40 MG tablet Take 40 mg by mouth every other day.    . isosorbide-hydrALAZINE (BIDIL) 20-37.5 MG tablet Take 2 tablets by mouth 3 (three) times daily.    . metroNIDAZOLE (FLAGYL) 500 MG tablet Take 1 tablet (500 mg total) by mouth 2 (two) times daily. 28 tablet 0  . omeprazole (PRILOSEC) 40 MG capsule Take 1 capsule (40 mg total) by mouth 2 (two) times daily. 28 capsule 0  . oxyCODONE-acetaminophen (PERCOCET/ROXICET) 5-325 MG tablet Take 1 tablet by mouth every 6 (six) hours as needed. 6 tablet 0   Social History   Socioeconomic History  . Marital status: Married    Spouse name: Not on file  . Number of children: Not on file  . Years of education: Not on  file  . Highest education level: Not on file  Occupational History  . Not on file  Tobacco Use  . Smoking status: Never Smoker  . Smokeless tobacco: Never Used  Vaping Use  . Vaping Use: Never used  Substance and Sexual Activity  . Alcohol use: Not Currently  . Drug use: No  . Sexual activity: Not on file  Other Topics Concern  . Not on file  Social History Narrative  . Not on file   Social Determinants of Health   Financial Resource Strain:   . Difficulty of Paying Living Expenses: Not on file  Food Insecurity:   . Worried About Charity fundraiser in the Last Year: Not on file  . Ran Out of Food in the Last Year: Not on file  Transportation Needs:   . Lack of Transportation (Medical): Not on file  . Lack of Transportation (Non-Medical): Not on file  Physical Activity:   . Days of Exercise per Week: Not on file  . Minutes of Exercise per Session: Not on file  Stress:   . Feeling of Stress : Not on file  Social Connections:   . Frequency of Communication with Friends and Family: Not on file  . Frequency of Social Gatherings with Friends and Family: Not on file  . Attends Religious Services: Not on file  . Active Member of Clubs or Organizations: Not on file  . Attends Archivist Meetings: Not on file  . Marital Status: Not on file  Intimate Partner Violence:   . Fear of Current or Ex-Partner: Not on file  . Emotionally Abused: Not on file  . Physically Abused: Not on file  . Sexually Abused: Not on file   Family History  Problem Relation Age of Onset  . Hypertension Other   . Hypertension Mother   . Diabetes Mother   . Cataracts Father   . Colon cancer Neg Hx   . Esophageal cancer Neg Hx   . Stomach cancer Neg Hx   . Pancreatic cancer Neg Hx     OBJECTIVE:  Vitals:   01/15/20 1418  BP: 113/80  Pulse: 64  Resp: 18  Temp: 98.3 F (36.8 C)  TempSrc: Oral  SpO2: 99%     General appearance: alert; appears fatigued, but nontoxic; speaking in  full sentences and tolerating own secretions HEENT: NCAT; Ears: EACs clear, TMs pearly gray; Eyes: PERRL.  EOM grossly intact. Sinuses: nontender; Nose: nares patent without rhinorrhea, Throat: Oropharyngeal erythema with cobblestoning, tonsils non erythematous or enlarged, uvula midline  Neck: supple without LAD Lungs: unlabored respirations, symmetrical air entry; cough: absent; no respiratory distress; CTAB Heart: regular rate and rhythm.  Radial pulses 2+ symmetrical bilaterally  Skin: warm and dry Psychological: alert and cooperative; normal mood and affect  LABS:  No results found for this or any previous visit (from the past 24 hour(s)).   ASSESSMENT & PLAN:  1. Viral illness   2. Encounter for preprocedure screening laboratory testing for COVID-19   3. Nasal congestion   4. Cough    Continue supportive care at home   COVID testing ordered.  It will take between 1-2 days for test results.  Someone will contact you regarding abnormal results.    Patient should remain in quarantine until they have received Covid results.  If negative you may resume normal activities (go back to work/school) while practicing hand hygiene, social distance, and mask wearing.  If positive, patient should remain in quarantine for 10 days from symptom onset AND greater than 72 hours after symptoms resolution (absence of fever without the use of fever-reducing medication and improvement in respiratory symptoms), whichever is longer Get plenty of rest and push fluids Use OTC zyrtec for nasal congestion, runny nose, and/or sore throat Use OTC flonase for nasal congestion and runny nose Use medications daily for symptom relief Use OTC medications like ibuprofen or tylenol as needed fever or pain Call or go to the ED if you have any new or worsening symptoms such as fever, worsening cough, shortness of breath, chest tightness, chest pain, turning blue, changes in mental status.  Reviewed expectations re:  course of current medical issues. Questions answered. Outlined signs and symptoms indicating need for more acute intervention. Patient verbalized understanding. After Visit Summary given.         Faustino Congress, NP 01/15/20 1450

## 2020-01-16 ENCOUNTER — Encounter: Payer: Self-pay | Admitting: Nurse Practitioner

## 2020-01-16 ENCOUNTER — Ambulatory Visit (HOSPITAL_COMMUNITY)
Admission: RE | Admit: 2020-01-16 | Discharge: 2020-01-16 | Disposition: A | Discharge status: Home / Self Care | Payer: 59 | Source: Ambulatory Visit | Attending: Pulmonary Disease | Admitting: Pulmonary Disease

## 2020-01-16 ENCOUNTER — Other Ambulatory Visit (HOSPITAL_COMMUNITY): Payer: Self-pay | Admitting: Nurse Practitioner

## 2020-01-16 ENCOUNTER — Telehealth: Payer: Self-pay

## 2020-01-16 DIAGNOSIS — Z23 Encounter for immunization: Secondary | ICD-10-CM | POA: Insufficient documentation

## 2020-01-16 DIAGNOSIS — U071 COVID-19: Secondary | ICD-10-CM | POA: Diagnosis present

## 2020-01-16 LAB — SARS CORONAVIRUS 2 (TAT 6-24 HRS): SARS Coronavirus 2: POSITIVE — AB

## 2020-01-16 MED ORDER — SODIUM CHLORIDE 0.9 % IV SOLN: INTRAVENOUS | Status: DC | PRN

## 2020-01-16 MED ORDER — FAMOTIDINE IN NACL 20-0.9 MG/50ML-% IV SOLN: 20.0000 mg | Freq: Once | INTRAVENOUS | Status: DC | PRN

## 2020-01-16 MED ORDER — DIPHENHYDRAMINE HCL 50 MG/ML IJ SOLN: 50.0000 mg | Freq: Once | INTRAMUSCULAR | Status: DC | PRN

## 2020-01-16 MED ORDER — ACETAMINOPHEN 325 MG PO TABS
650.0000 mg | ORAL_TABLET | Freq: Four times a day (QID) | ORAL | Status: DC | PRN
  Administered 2020-01-16: 650 mg via ORAL
  Filled 2020-01-16: qty 2

## 2020-01-16 MED ORDER — SODIUM CHLORIDE 0.9 % IV SOLN
1200.0000 mg | Freq: Once | INTRAVENOUS | Status: AC
  Administered 2020-01-16: 1200 mg via INTRAVENOUS

## 2020-01-16 MED ORDER — ALBUTEROL SULFATE HFA 108 (90 BASE) MCG/ACT IN AERS: 2.0000 | INHALATION_SPRAY | Freq: Once | RESPIRATORY_TRACT | Status: DC | PRN

## 2020-01-16 MED ORDER — METHYLPREDNISOLONE SODIUM SUCC 125 MG IJ SOLR: 125.0000 mg | Freq: Once | INTRAMUSCULAR | Status: DC | PRN

## 2020-01-16 MED ORDER — EPINEPHRINE 0.3 MG/0.3ML IJ SOAJ: 0.3000 mg | Freq: Once | INTRAMUSCULAR | Status: DC | PRN

## 2020-01-16 NOTE — Progress Notes (Signed)
°  Diagnosis: COVID-19  Physician: Dr. Joya Gaskins   Procedure: Cov[facility] Infusion Clinic Med: casirivimab\imdevimab infusion - Provided patient with casirivimab\imdevimab fact sheet for patients, parents and caregivers prior to infusion.  Complications: No immediate complications noted.  Discharge: Discharged home   [place], [place] 01/16/2020

## 2020-01-16 NOTE — Discharge Instructions (Signed)

## 2020-01-16 NOTE — Progress Notes (Signed)
I connected by phone with [name] on 01/16/2020 at 4:29 PM to discuss the potential use of an new treatment for mild to moderate COVID-19 viral infection in non-hospitalized patients.  This patient is a 54 y.o. male that meets the FDA criteria for Emergency Use Authorization of casirivimab\imdevimab.  Has a (+) direct SARS-CoV-2 viral test result  Has mild or moderate COVID-19   Is ? 55 years of age and weighs ? 40 kg  Is NOT hospitalized due to COVID-19  Is NOT requiring oxygen therapy or requiring an increase in baseline oxygen flow rate due to COVID-19  Is within 10 days of symptom onset  Has at least one of the high risk factor(s) for progression to severe COVID-19 and/or hospitalization as defined in EUA.  Specific high risk criteria : BMI > 25, Chronic Kidney Disease (CKD), Diabetes and Cardiovascular disease or hypertension   Onset 9/19.    I have spoken and communicated the following to the patient or parent/caregiver:  1. FDA has authorized the emergency use of casirivimab\imdevimab for the treatment of mild to moderate COVID-19 in adults and pediatric patients with positive results of direct SARS-CoV-2 viral testing who are 49 years of age and older weighing at least 40 kg, and who are at high risk for progressing to severe COVID-19 and/or hospitalization.  2. The significant known and potential risks and benefits of casirivimab\imdevimab, and the extent to which such potential risks and benefits are unknown.  3. Information on available alternative treatments and the risks and benefits of those alternatives, including clinical trials.  4. Patients treated with casirivimab\imdevimab should continue to self-isolate and use infection control measures (e.g., wear mask, isolate, social distance, avoid sharing personal items, clean and disinfect "high touch" surfaces, and frequent handwashing) according to CDC guidelines.   5. The patient or parent/caregiver has the option  to accept or refuse casirivimab\imdevimab .  After reviewing this information with the patient, The patient agreed to proceed with receiving casirivimab\imdevimab infusion and will be provided a copy of the Fact sheet prior to receiving the infusion.[name], [place], AGNP-C [phone] ([place])

## 2020-01-16 NOTE — Telephone Encounter (Signed)
Pt notified of positive COVID-19 test results. Pt verbalized understanding. Pt reports that they are feeling better.Pt advised to remain in self quarantine until at least 10 days since symptom onset And at least 24 hours fever free without antipyretics And improvement in respiratory symptoms. Patient advised to utilize over the counter medications to treat symptoms. Pt advised to seek treatment in the ED if respiratory issues/distress develops.Pt advised they should only leave home to seek and medical care and must wear a mask in public. Pt instructed to limit contact with family members or caregivers in the home. Pt advised to practice social distancing and to continue to use good preventative care measures such has frequent hand washing, staying out of crowds and cleaning hard surfaces frequently touched in the home.Pt informed that the health department will likely follow up and may have additional recommendations. Will notify [facility].

## 2020-01-23 ENCOUNTER — Encounter: Payer: Self-pay | Admitting: Gastroenterology

## 2020-02-16 ENCOUNTER — Ambulatory Visit (AMBULATORY_SURGERY_CENTER): Payer: Self-pay

## 2020-02-16 ENCOUNTER — Other Ambulatory Visit: Payer: Self-pay

## 2020-02-16 VITALS — Ht 68.0 in | Wt 190.0 lb

## 2020-02-16 DIAGNOSIS — K229 Disease of esophagus, unspecified: Secondary | ICD-10-CM

## 2020-02-16 NOTE — Progress Notes (Signed)
No allergies to soy or egg Pt is not on blood thinners or diet pills Denies issues with sedation/intubation Denies atrial flutter/fib Denies constipation   Emmi instructions given to pt  Pt is aware of Covid safety and care partner requirements.  

## 2020-03-01 ENCOUNTER — Encounter: Payer: Self-pay | Admitting: Gastroenterology

## 2020-03-01 ENCOUNTER — Ambulatory Visit (AMBULATORY_SURGERY_CENTER): Payer: 59 | Admitting: Gastroenterology

## 2020-03-01 ENCOUNTER — Other Ambulatory Visit: Payer: Self-pay

## 2020-03-01 VITALS — BP 158/77 | HR 58 | Temp 98.0°F | Resp 12 | Ht 68.0 in | Wt 190.0 lb

## 2020-03-01 DIAGNOSIS — K229 Disease of esophagus, unspecified: Secondary | ICD-10-CM

## 2020-03-01 DIAGNOSIS — K2950 Unspecified chronic gastritis without bleeding: Secondary | ICD-10-CM

## 2020-03-01 DIAGNOSIS — Z8619 Personal history of other infectious and parasitic diseases: Secondary | ICD-10-CM | POA: Diagnosis not present

## 2020-03-01 DIAGNOSIS — K2281 Esophageal polyp: Secondary | ICD-10-CM

## 2020-03-01 MED ORDER — SODIUM CHLORIDE 0.9 % IV SOLN
500.0000 mL | Freq: Once | INTRAVENOUS | Status: DC
Start: 2020-03-01 — End: 2020-03-01

## 2020-03-01 NOTE — Progress Notes (Signed)
Called to room to assist during endoscopic procedure.  Patient ID and intended procedure confirmed with present staff. Received instructions for my participation in the procedure from the performing physician.  

## 2020-03-01 NOTE — Patient Instructions (Signed)
The biopsies taken today have been sent for pathology.  The results can take 1-3 weeks to receive.    You may resume your previous diet and medication schedule.  Thank you for allowing [place] to care for you today!!!   YOU HAD AN ENDOSCOPIC PROCEDURE TODAY AT [place]:   Refer to the procedure report that was given to you for any specific questions about what was found during the examination.  If the procedure report does not answer your questions, please call your gastroenterologist to clarify.  If you requested that your care partner not be given the details of your procedure findings, then the procedure report has been included in a sealed envelope for you to review at your convenience later.  YOU SHOULD EXPECT: Some feelings of bloating in the abdomen. Passage of more gas than usual.  Walking can help get rid of the air that was put into your GI tract during the procedure and reduce the bloating.   Please Note:  You might notice some irritation and congestion in your nose or some drainage.  This is from the oxygen used during your procedure.  There is no need for concern and it should clear up in a day or so.  SYMPTOMS TO REPORT IMMEDIATELY:   Following upper endoscopy (EGD)  Vomiting of blood or coffee ground material  New chest pain or pain under the shoulder blades  Painful or persistently difficult swallowing  New shortness of breath  Fever of 100F or higher  Black, tarry-looking stools  For urgent or emergent issues, a gastroenterologist can be reached at any hour by calling [phone]. Do not use MyChart messaging for urgent concerns.    DIET:  We do recommend a small meal at first, but then you may proceed to your regular diet.  Drink plenty of fluids but you should avoid alcoholic beverages for 24 hours.  ACTIVITY:  You should plan to take it easy for the rest of today and you should NOT DRIVE or use heavy machinery until tomorrow (because of the  sedation medicines used during the test).    FOLLOW UP: Our staff will call the number listed on your records Monday morning between 7:15 am and 8:15 am to check on you and address any questions or concerns that you may have regarding the information given to you following your procedure. If we do not reach you, we will leave a message.  We will attempt to reach you two times.  During this call, we will ask if you have developed any symptoms of COVID 19. If you develop any symptoms (ie: fever, flu-like symptoms, shortness of breath, cough etc.) before then, please call [phone].  If you test positive for Covid 19 in the 2 weeks post procedure, please call and report this information to [place].    If any biopsies were taken you will be contacted by phone or by letter within the next 1-3 weeks.  Please call us at [phone] if you have not heard about the biopsies in 3 weeks.    SIGNATURES/CONFIDENTIALITY: You and/or your care partner have signed paperwork which will be entered into your electronic medical record.  These signatures attest to the fact that that the information above on your After Visit Summary has been reviewed and is understood.  Full responsibility of the confidentiality of this discharge information lies with you and/or your care-partner.

## 2020-03-01 NOTE — Progress Notes (Signed)
VS-CW  Pt's states no medical or surgical changes since previsit or office visit.  

## 2020-03-01 NOTE — Progress Notes (Signed)
To PACU, VSS. Report to [email or web address] 

## 2020-03-01 NOTE — Op Note (Addendum)
[facility] Patient Name: [name] Procedure Date: 03/01/2020 9:53 AM MRN: [id number] Endoscopist: Remo Lipps P. Havery Moros , MD Age: 54 Referring MD:  Date of Birth: 1965/06/25 Gender: Male Account #: [id number] Procedure:                Upper GI endoscopy Indications:              Follow-up of benign esophageal subepithelial                            nodule, history of Helicobacter pylori s/p                            treatment, in need of eradication testing Medicines:                Monitored Anesthesia Care Procedure:                Pre-Anesthesia Assessment:                           - Prior to the procedure, a History and Physical                            was performed, and patient medications and                            allergies were reviewed. The patient's tolerance of                            previous anesthesia was also reviewed. The risks                            and benefits of the procedure and the sedation                            options and risks were discussed with the patient.                            All questions were answered, and informed consent                            was obtained. Prior Anticoagulants: The patient has                            taken no previous anticoagulant or antiplatelet                            agents. ASA Grade Assessment: III - A patient with                            severe systemic disease. After reviewing the risks                            and benefits, the patient was deemed in  satisfactory condition to undergo the procedure.                           After obtaining informed consent, the endoscope was                            passed under direct vision. Throughout the                            procedure, the patient's blood pressure, pulse, and                            oxygen saturations were monitored continuously. The                            Endoscope was  introduced through the mouth, and                            advanced to the second part of duodenum. The upper                            GI endoscopy was accomplished without difficulty.                            The patient tolerated the procedure well. Scope In: Scope Out: Findings:                 Esophagogastric landmarks were identified: the                            Z-line was found at 39 cm, the gastroesophageal                            junction was found at 39 cm and the upper extent of                            the gastric folds was found at 39 cm from the                            incisors.                           A single roughly 6-29mm subepithelial nodule was                            found in the lower third of the esophagus, 38 cm                            from the incisors. Bite on bite biopsies were taken                            with a cold forceps for histology.  The exam of the esophagus was otherwise normal.                           Evidence of a Roux-en-Y gastrojejunostomy was                            found. The gastrojejunal anastomosis was                            characterized by healthy appearing mucosa. 2                            visible staples were noted and removed with forceps.                           The gastric pouch was otherwise normal.                           Biopsies were taken with a cold forceps for                            Helicobacter pylori eradication testing.                           The examined small bowel limb and blind pouch were                            normal. Complications:            No immediate complications. Estimated blood loss:                            Minimal. Estimated Blood Loss:     Estimated blood loss was minimal. Impression:               - Esophagogastric landmarks identified.                           - Benign subepithelial nodule found in the                             esophagus, not significantly changed since the last                            exam. Biopsied.                           - Roux-en-Y gastrojejunostomy with gastrojejunal                            anastomosis characterized by healthy appearing                            mucosa and 2 visible staples which were removed.                           - Biopsies  taken from the gastric pouch to rule out                            H pylori                           - Normal examined small bowel limb Recommendation:           - Patient has a contact number available for                            emergencies. The signs and symptoms of potential                            delayed complications were discussed with the                            patient. Return to normal activities tomorrow.                            Written discharge instructions were provided to the                            patient.                           - Resume previous diet.                           - Continue present medications.                           - Await pathology results. Remo Lipps P. Isaias Dowson, MD 03/01/2020 10:25:02 AM This report has been signed electronically.

## 2020-03-05 ENCOUNTER — Telehealth: Payer: Self-pay | Admitting: *Deleted

## 2020-03-05 ENCOUNTER — Telehealth: Payer: Self-pay

## 2020-03-05 NOTE — Telephone Encounter (Signed)
First post procedure visit, phone busy.

## 2020-03-05 NOTE — Telephone Encounter (Signed)
No answer for post procedure call back. Unable to leave message. 

## 2020-09-18 ENCOUNTER — Encounter (HOSPITAL_COMMUNITY): Payer: Self-pay

## 2020-12-03 IMAGING — US US RENAL
1 series · 14 of 25 positions shown · non-contrast
Comparison: December 10, 2014

CLINICAL DATA: Chronic renal disease.  Diabetes.

EXAM:
RENAL / URINARY TRACT ULTRASOUND COMPLETE

[Series 1: us renal · 0.25mm/px · 14 of 55 slices shown]
[im 1/55]
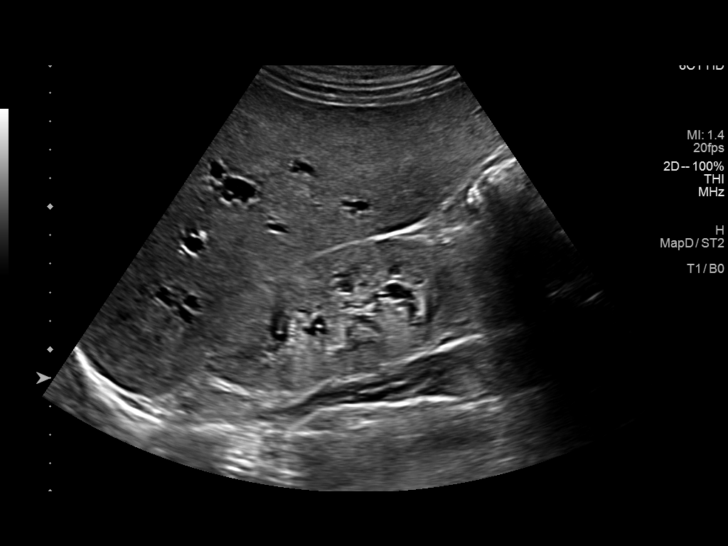
[im 5/55]
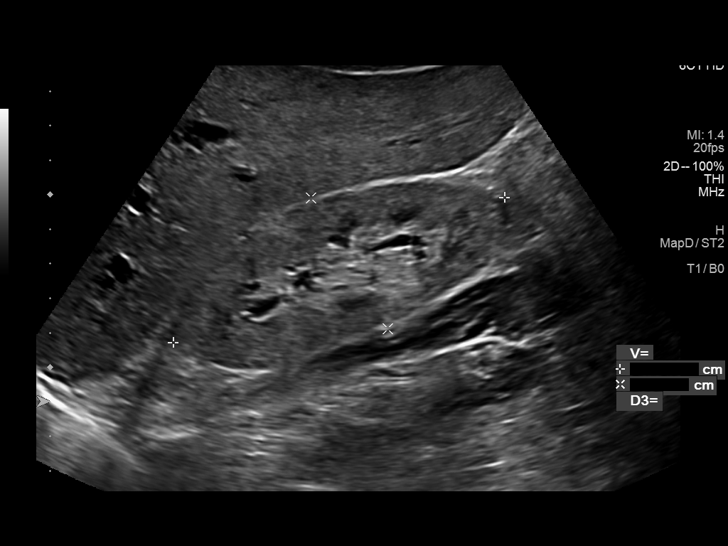
[im 10/55]
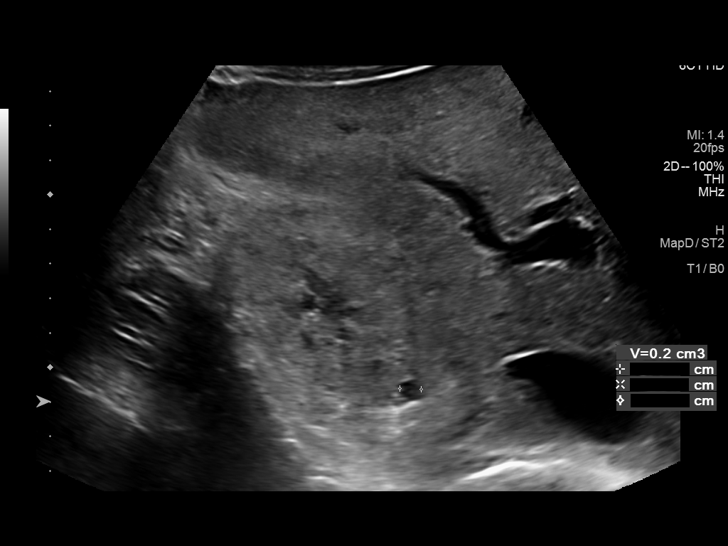
[im 14/55]
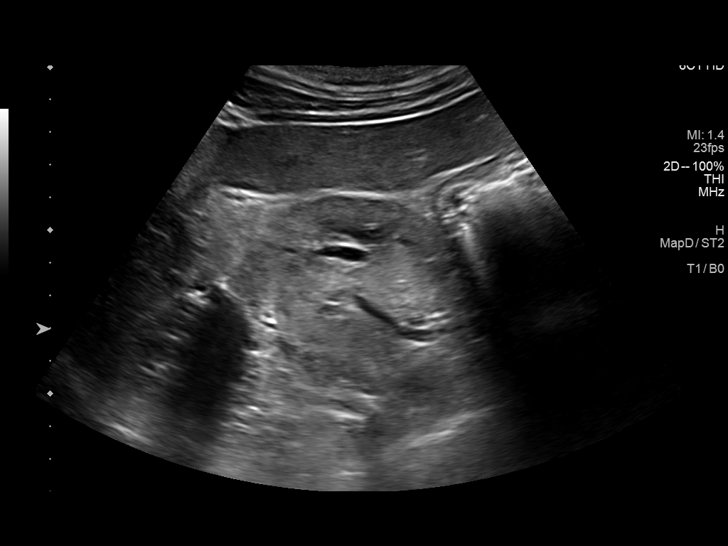
[im 19/55]
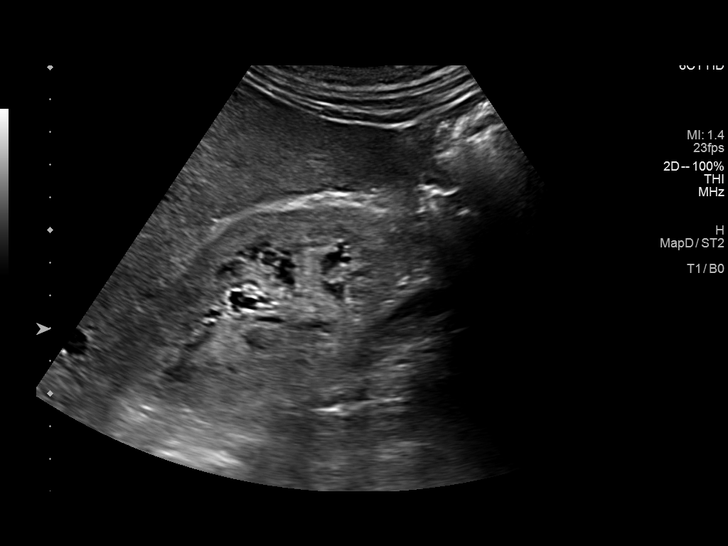
[im 21/55]
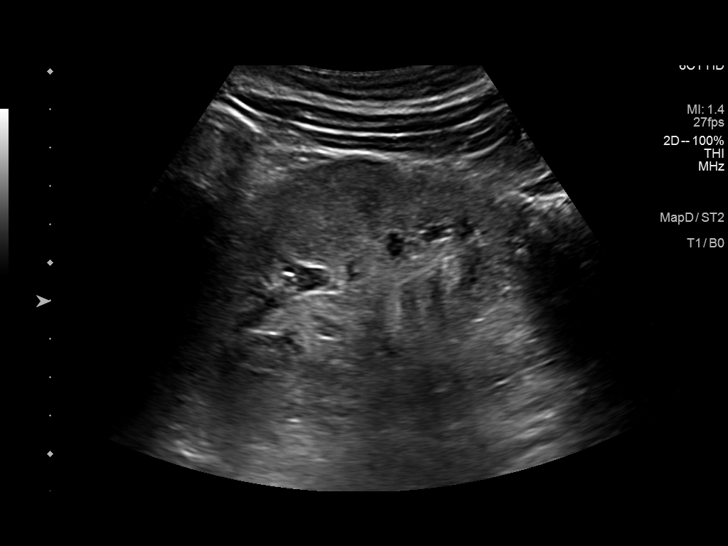
[im 25/55]
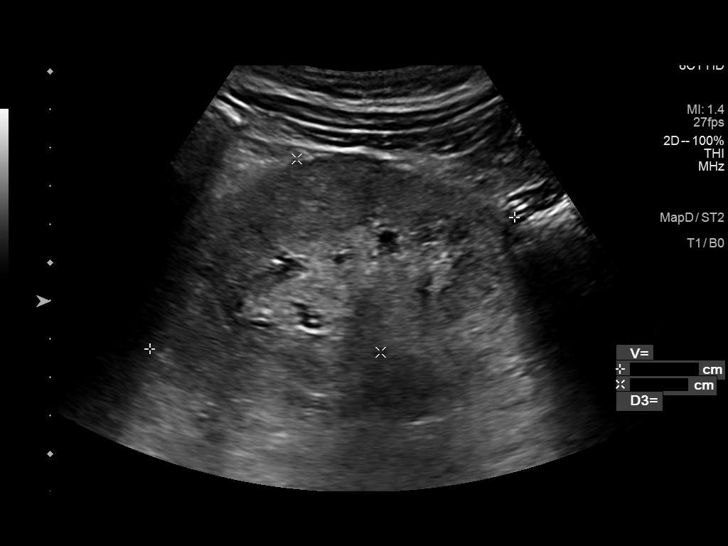
[im 30/55]
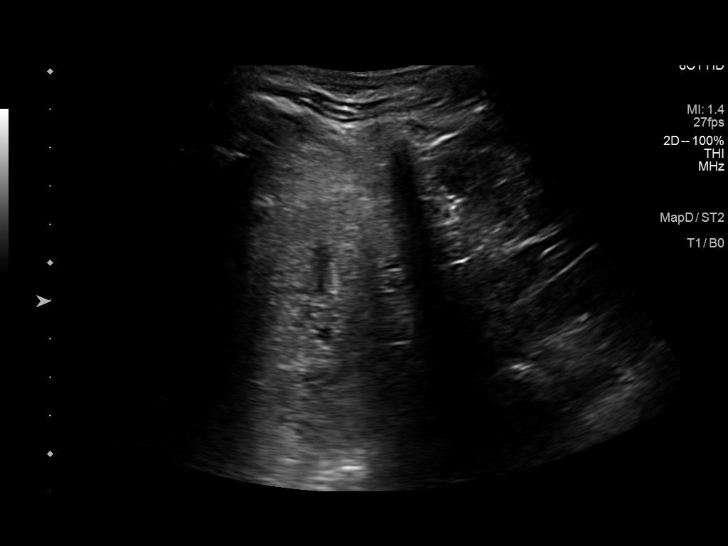
[im 34/55]
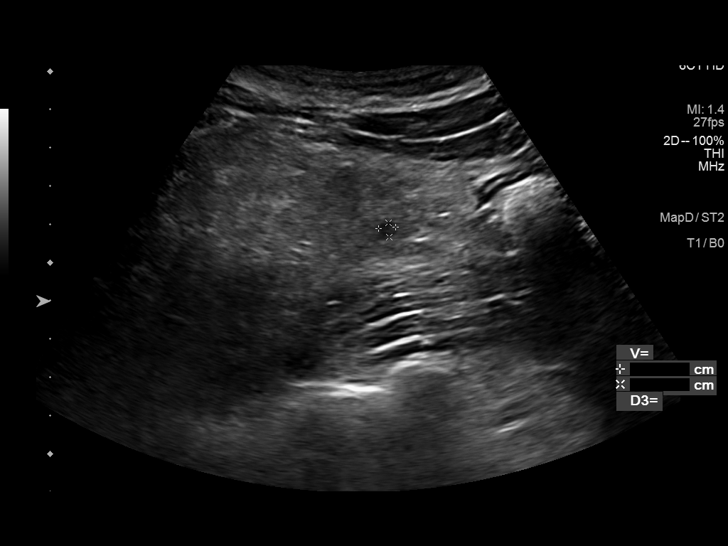
[im 37/55]
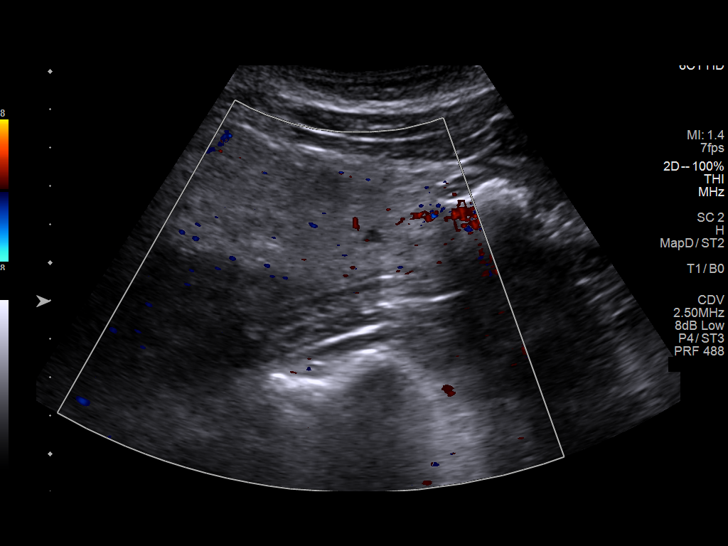
[im 41/55]
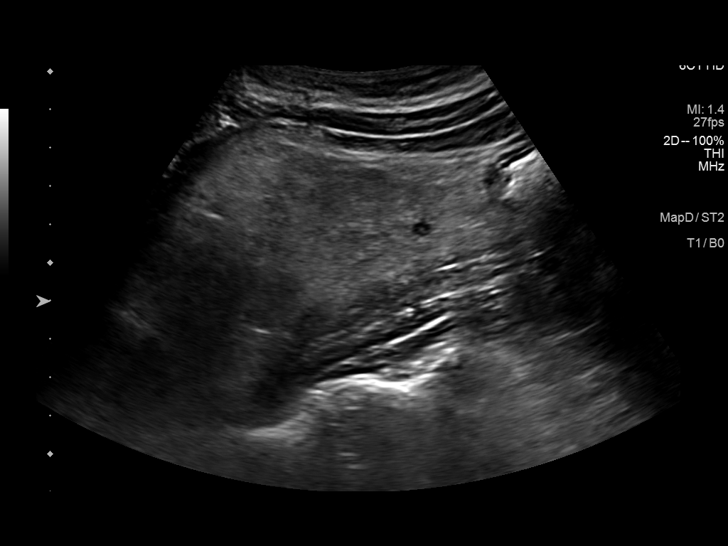
[im 46/55]
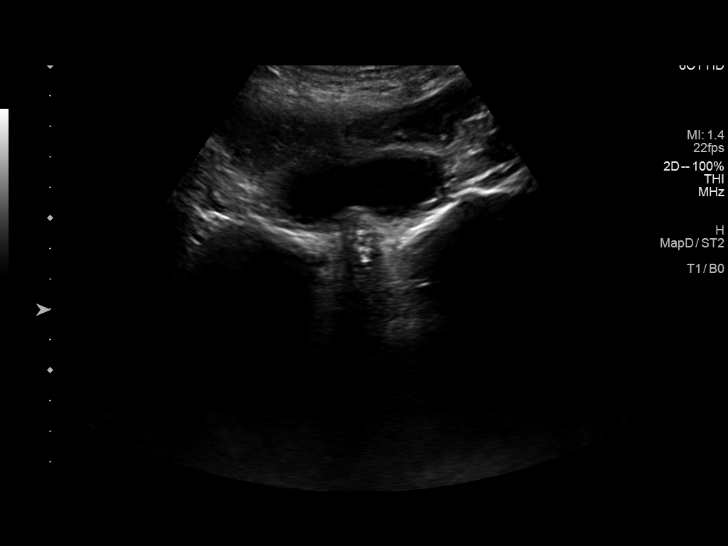
[im 50/55]
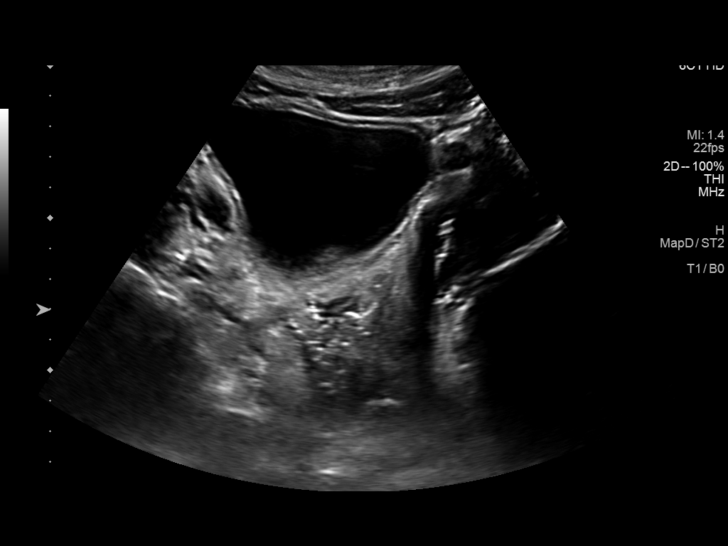
[im 55/55]
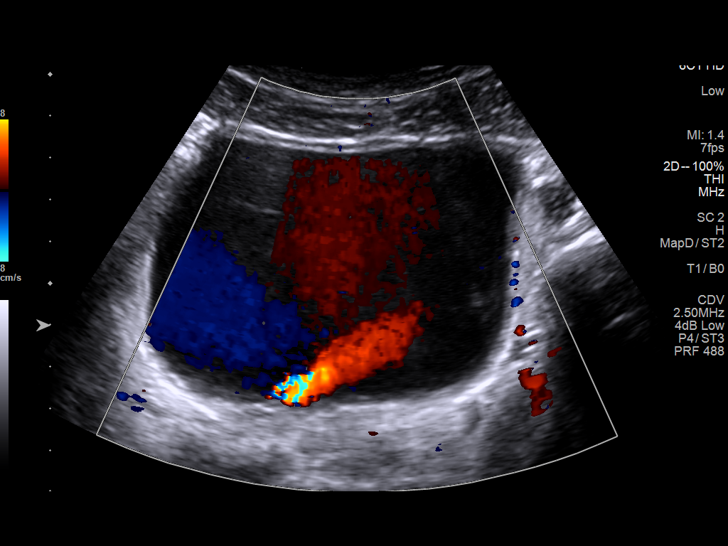

[14 of 25 positions shown; findings below may reference images not displayed]

FINDINGS: Right Kidney:

Renal measurements: 10.5 x 4.4 x 5.5 cm = volume: 133.3 mL. Diffuse
increased echogenicity. A small 8 mm cyst is seen in the right
kidney.

Left Kidney:

Renal measurements: 10.1 x 5.5 x 5.5 cm = volume: 161.5 mL. There is
a tiny 5 mm hypoechoic mass, likely a tiny cyst but too small to
characterize. Diffuse increased echogenicity seen throughout the
left kidney. No hydronephrosis.

Bladder:

Appears normal for degree of bladder distention.
IMPRESSION: 1. Increased echogenicity in both kidneys consistent with history of
medical renal disease.
2. There is a small cyst in the right kidney. A 5 mm lesion in the
left kidney is likely a tiny cyst as well but too small to
characterize.
3. No other acute abnormalities.

## 2021-03-07 ENCOUNTER — Ambulatory Visit: Payer: Self-pay | Admitting: Surgery

## 2021-03-07 NOTE — H&P (View-Only) (Signed)
Surgical Evaluation   Chief Complaint: subcutaneous cyst  HPI: Very pleasant 54-year-old man with longstanding cyst on the back of his neck.  This has been drained before but quickly recurred.  He is interested in having this removed.    No Known Allergies  Past Medical History:  Diagnosis Date   Anemia    iron infusions in last few months for anemia    Arthritis    ankles    Chronic kidney disease    followed by Dr Colodonato    Diabetes mellitus without complication (HCC)    type II With gastric bypass no longer on meds    Gout    History of anemia due to CKD 12/22/2018   Hyperparathyroidism (HCC)    Hypertension    Obesity    PVC (premature ventricular contraction) 02/16/2018   Sinus congestion    all the time   Sleep apnea    cpap off and on     Past Surgical History:  Procedure Laterality Date   AV FISTULA PLACEMENT Right 02/18/2019   Procedure: ARTERIOVENOUS (AV) FISTULA CREATION RIGHT ARM;  Surgeon: Clark, Christopher J, MD;  Location: [facility];  Service: Vascular;  Laterality: Right;   COLONOSCOPY  06/19/2019   GASTRIC ROUX-EN-Y N/A 07/07/2017   Procedure: LAPAROSCOPIC ROUX-EN-Y GASTRIC BYPASS WITH UPPER ENDOSCOPY;  Surgeon: [name], Luke Aaron, MD;  Location: [facility];  Service: General;  Laterality: N/A;   NASAL SEPTUM SURGERY     QUADRICEPS TENDON REPAIR  05/13/2012   Procedure: REPAIR QUADRICEP TENDON;  Surgeon: Steven R Norris, MD;  Location: [facility];  Service: Orthopedics;  Laterality: Right;   UMBILICAL HERNIA REPAIR N/A 07/09/2017   Procedure: LAPAROSCOPIC UMBILICAL HERNIA WITH MESH;  Surgeon: [name], Luke Aaron, MD;  Location: [facility];  Service: General;  Laterality: N/A;   UPPER GASTROINTESTINAL ENDOSCOPY  06/19/2019    Family History  Problem Relation Age of Onset   Hypertension Other    Hypertension Mother    Diabetes Mother    Cataracts Father    Colon cancer Neg Hx    Esophageal cancer Neg Hx    Stomach cancer Neg Hx    Pancreatic cancer Neg Hx     Colon polyps Neg Hx    Rectal cancer Neg Hx     Social History   Socioeconomic History   Marital status: Married    Spouse name: Not on file   Number of children: Not on file   Years of education: Not on file   Highest education level: Not on file  Occupational History   Not on file  Tobacco Use   Smoking status: Never   Smokeless tobacco: Never  Vaping Use   Vaping Use: Never used  Substance and Sexual Activity   Alcohol use: Not Currently   Drug use: No   Sexual activity: Not on file  Other Topics Concern   Not on file  Social History Narrative   Not on file   Social Determinants of Health   Financial Resource Strain: Not on file  Food Insecurity: Not on file  Transportation Needs: Not on file  Physical Activity: Not on file  Stress: Not on file  Social Connections: Not on file    Current Outpatient Medications on File Prior to Visit  Medication Sig Dispense Refill   acetaminophen (TYLENOL) 500 MG tablet Take 1,000 mg by mouth every 6 (six) hours as needed (for pain.).     allopurinol (ZYLOPRIM) 100 MG tablet Take 100 mg by   mouth daily.     amLODipine (NORVASC) 10 MG tablet Take 10 mg by mouth daily. (Patient not taking: Reported on 02/16/2020)     amoxicillin (AMOXIL) 500 MG tablet Take 2 tablets (1,000 mg total) by mouth 2 (two) times daily. (Patient not taking: Reported on 02/16/2020) 56 tablet 0   aspirin 81 MG chewable tablet Chew 1 tablet (81 mg total) by mouth daily. 1 tablet    AURYXIA 1 GM 210 MG(Fe) tablet Take 420 mg by mouth 3 (three) times daily with meals.      B Complex-C-Biotin-E-Min-FA (DIALYVITE 5000) 5 MG TABS Take by mouth.     Calcium Carb-Cholecalciferol (CALCIUM + D3 PO) Take 1 tablet by mouth 3 (three) times daily.     carvedilol (COREG) 12.5 MG tablet TAKE 1 TABLET (12.5 MG TOTAL) BY MOUTH 2 (TWO) TIMES DAILY. **NEEDS OFFICE VISIT**2ND ATTEMPT 30 tablet 1   cinacalcet (SENSIPAR) 60 MG tablet Take 60 mg by mouth daily.     clarithromycin  (BIAXIN) 500 MG tablet Take 1 tablet (500 mg total) by mouth 2 (two) times daily. (Patient not taking: Reported on 02/16/2020) 28 tablet 0   doxazosin (CARDURA) 4 MG tablet Take 4 mg by mouth at bedtime.  (Patient not taking: Reported on 02/16/2020)     furosemide (LASIX) 40 MG tablet Take 40 mg by mouth every other day. (Patient not taking: Reported on 02/16/2020)     isosorbide-hydrALAZINE (BIDIL) 20-37.5 MG tablet Take 2 tablets by mouth 3 (three) times daily.     metroNIDAZOLE (FLAGYL) 500 MG tablet Take 1 tablet (500 mg total) by mouth 2 (two) times daily. (Patient not taking: Reported on 02/16/2020) 28 tablet 0   omeprazole (PRILOSEC) 40 MG capsule Take 1 capsule (40 mg total) by mouth 2 (two) times daily. (Patient not taking: Reported on 02/16/2020) 28 capsule 0   oxyCODONE-acetaminophen (PERCOCET/ROXICET) 5-325 MG tablet Take 1 tablet by mouth every 6 (six) hours as needed. (Patient not taking: Reported on 02/16/2020) 6 tablet 0   No current facility-administered medications on file prior to visit.    Review of Systems: a complete, 10pt review of systems was completed with pertinent positives and negatives as documented in the HPI  Physical Exam: 6x4cm mobile fluctuant subcutaneous mass on the lower neck/upper back right of midline   CBC Latest Ref Rng & Units 03/21/2019 03/21/2019 02/21/2019  WBC 4.0 - 10.5 K/uL - 4.4 -  Hemoglobin 13.0 - 17.0 g/dL 9.8(L) 9.5(L) 9.9(L)  Hematocrit 39.0 - 52.0 % - 28.6(L) -  Platelets 150 - 400 K/uL - 138(L) -    CMP Latest Ref Rng & Units 03/21/2019 03/21/2019 02/18/2019  Glucose 70 - 99 mg/dL 104(H) - 97  BUN 6 - 20 mg/dL 133(H) - 97(H)  Creatinine 0.61 - 1.24 mg/dL 11.06(H) - 9.40(H)  Sodium 135 - 145 mmol/L 137 - 141  Potassium 3.5 - 5.1 mmol/L 4.1 - 3.6  Chloride 98 - 111 mmol/L 106 - 108  CO2 22 - 32 mmol/L 18(L) - -  Calcium 8.7 - 10.2 mg/dL 8.4(L) 8.2(L) -  Total Protein 6.5 - 8.1 g/dL 6.0(L) - -  Total Bilirubin 0.3 - 1.2 mg/dL 0.6  - -  Alkaline Phos 38 - 126 U/L 85 - -  AST 15 - 41 U/L 27 - -  ALT 0 - 44 U/L 45(H) - -    No results found for: INR, PROTIME  Imaging: No results found.   A/P: Enlarging subcutaneous cyst on the back of the   neck.  We discussed excision under MAC including surgical technique, risks of bleeding, infection, pain, scarring, recurrence of the lesion, seroma or hematoma.  Questions welcomed and answered.  He wishes to proceed.    Patient Active Problem List   Diagnosis Date Noted   ESRD on dialysis (HCC) 05/12/2019   History of anemia due to CKD 12/22/2018   PVC (premature ventricular contraction) 02/16/2018   Effusion, right knee 07/28/2017   Arthritis 07/27/2017   Morbid obesity (HCC) 07/07/2017   Back pain 03/27/2017   Hypertension    Diabetes mellitus without complication (HCC)    Obesity    Obstructive sleep apnea 11/06/2015   Nasal turbinate hypertrophy 11/06/2015   Rhinitis, chronic 11/06/2015       Marella Vanderpol, MD [facility]  See AMION to contact appropriate on-call provider   

## 2021-03-07 NOTE — H&P (Signed)
Surgical Evaluation   Chief Complaint: subcutaneous cyst  HPI: Very pleasant 55 year old man with longstanding cyst on the back of his neck.  This has been drained before but quickly recurred.  He is interested in having this removed.    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    Gout    History of anemia due to CKD 12/22/2018   Hyperparathyroidism ([place])    Hypertension    Obesity    PVC (premature ventricular contraction) 02/16/2018   Sinus congestion    all the time   Sleep apnea    cpap off and on     Past Surgical History:  Procedure Laterality Date   AV FISTULA PLACEMENT Right 02/18/2019   Procedure: ARTERIOVENOUS (AV) FISTULA CREATION RIGHT ARM;  Surgeon: Marty Heck, MD;  Location: [facility];  Service: Vascular;  Laterality: Right;   COLONOSCOPY  06/19/2019   GASTRIC ROUX-EN-Y N/A 07/07/2017   Procedure: LAPAROSCOPIC ROUX-EN-Y GASTRIC BYPASS WITH UPPER ENDOSCOPY;  Surgeon: Kieth Brightly [name], MD;  Location[facility];  Service: General;  Laterality: N/A;   NASAL SEPTUM [place]  05/13/2012   Procedure: REPAIR QUADRICEP TENDON;  Surgeon: Augustin Schooling, MD;  Location: [facility];  Service: Orthopedics;  Laterality: Right;   UMBILICAL HERNIA REPAIR N/A 07/09/2017   Procedure: LAPAROSCOPIC UMBILICAL HERNIA WITH MESH;  Surgeon: [name], [name], MD;  [facility];  Service: General;  Laterality: N/A;   UPPER GASTROINTESTINAL ENDOSCOPY  06/19/2019    Family History  Problem Relation Age of Onset   Hypertension Other    Hypertension Mother    Diabetes Mother    Cataracts Father    Colon cancer Neg Hx    Esophageal cancer Neg Hx    Stomach cancer Neg Hx    Pancreatic cancer Neg Hx     Colon polyps Neg Hx    Rectal cancer Neg Hx     Social History   Socioeconomic History   Marital status: Married    Spouse name: Not on file   Number of children: Not on file   Years of education: Not on file   Highest education level: Not on file  Occupational History   Not on file  Tobacco Use   Smoking status: Never   Smokeless tobacco: Never  Vaping Use   Vaping Use: Never used  Substance and Sexual Activity   Alcohol use: Not Currently   Drug use: No   Sexual activity: Not on file  Other Topics Concern   Not on file  Social History Narrative   Not on file   Social Determinants of Health   Financial Resource Strain: Not on file  Food Insecurity: Not on file  Transportation Needs: Not on file  Physical Activity: Not on file  Stress: Not on file  Social Connections: Not on file    Current Outpatient Medications on File Prior to Visit  Medication Sig Dispense Refill   acetaminophen (TYLENOL) 500 MG tablet Take 1,000 mg by mouth every 6 (six) hours as needed (for pain.).     allopurinol (ZYLOPRIM) 100 MG tablet Take 100 mg by  mouth daily.     amLODipine (NORVASC) 10 MG tablet Take 10 mg by mouth daily. (Patient not taking: Reported on 02/16/2020)     amoxicillin (AMOXIL) 500 MG tablet Take 2 tablets (1,000 mg total) by mouth 2 (two) times daily. (Patient not taking: Reported on 02/16/2020) 56 tablet 0   aspirin 81 MG chewable tablet Chew 1 tablet (81 mg total) by mouth daily. 1 tablet    AURYXIA 1 GM 210 MG(Fe) tablet Take 420 mg by mouth 3 (three) times daily with meals.      B Complex-C-Biotin-E-Min-FA (DIALYVITE 5000) 5 MG TABS Take by mouth.     Calcium Carb-Cholecalciferol (CALCIUM + D3 PO) Take 1 tablet by mouth 3 (three) times daily.     carvedilol (COREG) 12.5 MG tablet TAKE 1 TABLET (12.5 MG TOTAL) BY MOUTH 2 (TWO) TIMES DAILY. **NEEDS OFFICE VISIT**2ND ATTEMPT 30 tablet 1   cinacalcet (SENSIPAR) 60 MG tablet Take 60 mg by mouth daily.     clarithromycin  (BIAXIN) 500 MG tablet Take 1 tablet (500 mg total) by mouth 2 (two) times daily. (Patient not taking: Reported on 02/16/2020) 28 tablet 0   doxazosin (CARDURA) 4 MG tablet Take 4 mg by mouth at bedtime.  (Patient not taking: Reported on 02/16/2020)     furosemide (LASIX) 40 MG tablet Take 40 mg by mouth every other day. (Patient not taking: Reported on 02/16/2020)     isosorbide-hydrALAZINE (BIDIL) 20-37.5 MG tablet Take 2 tablets by mouth 3 (three) times daily.     metroNIDAZOLE (FLAGYL) 500 MG tablet Take 1 tablet (500 mg total) by mouth 2 (two) times daily. (Patient not taking: Reported on 02/16/2020) 28 tablet 0   omeprazole (PRILOSEC) 40 MG capsule Take 1 capsule (40 mg total) by mouth 2 (two) times daily. (Patient not taking: Reported on 02/16/2020) 28 capsule 0   oxyCODONE-acetaminophen (PERCOCET/ROXICET) 5-325 MG tablet Take 1 tablet by mouth every 6 (six) hours as needed. (Patient not taking: Reported on 02/16/2020) 6 tablet 0   No current facility-administered medications on file prior to visit.    Review of Systems: a complete, 10pt review of systems was completed with pertinent positives and negatives as documented in the HPI  Physical Exam: 6x4cm mobile fluctuant subcutaneous mass on the lower neck/upper back right of midline   CBC Latest Ref Rng & Units 03/21/2019 03/21/2019 02/21/2019  WBC 4.0 - 10.5 K/uL - 4.4 -  Hemoglobin 13.0 - 17.0 g/dL 9.8(L) 9.5(L) 9.9(L)  Hematocrit 39.0 - 52.0 % - 28.6(L) -  Platelets 150 - 400 K/uL - 138(L) -    CMP Latest Ref Rng & Units 03/21/2019 03/21/2019 02/18/2019  Glucose 70 - 99 mg/dL 104(H) - 97  BUN 6 - 20 mg/dL 133(H) - 97(H)  Creatinine 0.61 - 1.24 mg/dL 11.06(H) - 9.40(H)  Sodium 135 - 145 mmol/L 137 - 141  Potassium 3.5 - 5.1 mmol/L 4.1 - 3.6  Chloride 98 - 111 mmol/L 106 - 108  CO2 22 - 32 mmol/L 18(L) - -  Calcium 8.7 - 10.2 mg/dL 8.4(L) 8.2(L) -  Total Protein 6.5 - 8.1 g/dL 6.0(L) - -  Total Bilirubin 0.3 - 1.2 mg/dL 0.6  - -  Alkaline Phos 38 - 126 U/L 85 - -  AST 15 - 41 U/L 27 - -  ALT 0 - 44 U/L 45(H) - -    No results found for: INR, PROTIME  Imaging: No results found.   A/P: Enlarging subcutaneous cyst on the back of the  neck.  We discussed excision under MAC including surgical technique, risks of bleeding, infection, pain, scarring, recurrence of the lesion, seroma or hematoma.  Questions welcomed and answered.  He wishes to proceed.    Patient Active Problem List   Diagnosis Date Noted   ESRD on dialysis ([place]) 05/12/2019   History of anemia due to CKD 12/22/2018   PVC (premature ventricular contraction) 02/16/2018   Effusion, right knee 07/28/2017   Arthritis 07/27/2017   Morbid obesity ([place]) 07/07/2017   Back pain 03/27/2017   Hypertension    Diabetes mellitus without complication ([place])    Obesity    Obstructive sleep apnea 11/06/2015   Nasal turbinate hypertrophy 11/06/2015   Rhinitis, chronic 11/06/2015       Romana Juniper, MD [facility]  See AMION to contact appropriate on-call provider

## 2021-03-11 NOTE — Progress Notes (Addendum)
COVID swab appointment:  N/A  COVID Vaccine Completed:  Yes x2 Date COVID Vaccine completed: Has received booster: Yes x3 COVID vaccine manufacturer: [employer]   Date of COVID positive in last 90 days:  No  PCP - Charolette Forward, MD Cardiologist - Skeet Latch, MD  Chest x-ray - 02-05-21 [facility] EKG - 02-05-21 [facility]. On chart Stress Test - greater than 2 years, [facility] ECHO - greater than 2 years, [facility] Cardiac Cath - N/A Pacemaker/ICD device last checked: Spinal Cord Stimulator:  Sleep Study - Yes, +sleep apnea CPAP - No longer uses after weight loss  Fasting Blood Sugar -  Checks Blood Sugar - not checking currently, machine broken  Blood Thinner Instructions: N/A Aspirin Instructions: Last Dose:  Activity level:  Can go up a flight of stairs and perform activities of daily living without stopping and without symptoms of chest pain or shortness of breath.    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 Patient was also instructed that they will need to review over the PAT instructions again at home before surgery.

## 2021-03-11 NOTE — Patient Instructions (Addendum)
DUE TO COVID-19 ONLY ONE VISITOR IS ALLOWED TO COME WITH YOU AND STAY IN THE WAITING ROOM ONLY DURING PRE OP AND PROCEDURE.   **NO VISITORS ARE ALLOWED IN THE SHORT STAY AREA OR RECOVERY ROOM!!**       Your procedure is scheduled on:  Tuesday, 03-19-21   Report to [facility] Main  Entrance     Report to admitting at 7:15 AM   Call this number if you have problems the morning of surgery [phone]   Do not eat food :After Midnight.   May have liquids until 6:30 AM day of surgery  CLEAR LIQUID DIET  Foods Allowed                                                                     Foods Excluded  Water, Black Coffee (no milk/no creamer) and tea, regular and decaf                              liquids that you cannot  Plain Jell-O in any flavor  (No red)                         see through such as: Fruit ices (not with fruit pulp)                                 milk, soups, orange juice  Iced Popsicles (No red)                                    All solid food                             Apple juices Sports drinks like Gatorade (No red) Lightly seasoned clear broth or consume(fat free) Sugar    Oral Hygiene is also important to reduce your risk of infection.                                    Remember - BRUSH YOUR TEETH THE MORNING OF SURGERY WITH YOUR REGULAR TOOTHPASTE   Do NOT smoke after Midnight   Take these medicines the morning of surgery with A SIP OF WATER:  Tylenol            Stop all vitamins and herbal supplements a week before surgery             You may not have any metal on your body including hair pins, jewelry, and body piercing             Do not wear  lotions, powders, cologne, or deodorant              Men may shave face and neck.  Do not bring valuables to the hospital. [place].   Contacts, dentures or bridgework may not be worn into surgery.    Patients discharged the  day of surgery will not be allowed  to drive home.  Please read over the following fact sheets you were given: IF YOU HAVE QUESTIONS ABOUT YOUR PRE OP INSTRUCTIONS PLEASE CALL [place] - Preparing for Surgery Before surgery, you can play an important role.  Because skin is not sterile, your skin needs to be as free of germs as possible.  You can reduce the number of germs on your skin by washing with CHG (chlorahexidine gluconate) soap before surgery.  CHG is an antiseptic cleaner which kills germs and bonds with the skin to continue killing germs even after washing. Please DO NOT use if you have an allergy to CHG or antibacterial soaps.  If your skin becomes reddened/irritated stop using the CHG and inform your nurse when you arrive at Short Stay. Do not shave (including legs and underarms) for at least 48 hours prior to the first CHG shower.  You may shave your face/neck.  Please follow these instructions carefully:  1.  Shower with CHG Soap the night before surgery and the  morning of surgery.  2.  If you choose to wash your hair, wash your hair first as usual with your normal  shampoo.  3.  After you shampoo, rinse your hair and body thoroughly to remove the shampoo.                             4.  Use CHG as you would any other liquid soap.  You can apply chg directly to the skin and wash.  Gently with a scrungie or clean washcloth.  5.  Apply the CHG Soap to your body ONLY FROM THE NECK DOWN.   Do   not use on face/ open                           Wound or open sores. Avoid contact with eyes, ears mouth and   genitals (private parts).                       Wash face,  Genitals (private parts) with your normal soap.             6.  Wash thoroughly, paying special attention to the area where your    surgery  will be performed.  7.  Thoroughly rinse your body with warm water from the neck down.  8.  DO NOT shower/wash with your normal soap after using and rinsing off the CHG Soap.                9.  Pat  yourself dry with a clean towel.            10.  Wear clean pajamas.            11.  Place clean sheets on your bed the night of your first shower and do not  sleep with pets. Day of Surgery : Do not apply any lotions/deodorants the morning of surgery.  Please wear clean clothes to the hospital/surgery center.  FAILURE TO FOLLOW THESE INSTRUCTIONS MAY RESULT IN THE CANCELLATION OF YOUR SURGERY  PATIENT SIGNATURE_________________________________  NURSE SIGNATURE__________________________________  ________________________________________________________________________

## 2021-03-12 ENCOUNTER — Encounter (HOSPITAL_COMMUNITY): Payer: Self-pay

## 2021-03-12 ENCOUNTER — Other Ambulatory Visit: Payer: Self-pay

## 2021-03-12 ENCOUNTER — Encounter (HOSPITAL_COMMUNITY)
Admission: RE | Admit: 2021-03-12 | Discharge: 2021-03-12 | Disposition: A | Discharge status: Home / Self Care | Payer: 59 | Source: Ambulatory Visit | Attending: Surgery | Admitting: Surgery

## 2021-03-12 VITALS — BP 123/88 | HR 64 | Temp 97.7°F | Resp 18 | Ht 68.0 in | Wt 203.4 lb

## 2021-03-12 DIAGNOSIS — I2510 Atherosclerotic heart disease of native coronary artery without angina pectoris: Secondary | ICD-10-CM | POA: Diagnosis not present

## 2021-03-12 DIAGNOSIS — Z992 Dependence on renal dialysis: Secondary | ICD-10-CM | POA: Diagnosis not present

## 2021-03-12 DIAGNOSIS — I10 Essential (primary) hypertension: Secondary | ICD-10-CM | POA: Insufficient documentation

## 2021-03-12 DIAGNOSIS — D649 Anemia, unspecified: Secondary | ICD-10-CM | POA: Diagnosis not present

## 2021-03-12 DIAGNOSIS — Z01812 Encounter for preprocedural laboratory examination: Secondary | ICD-10-CM | POA: Diagnosis not present

## 2021-03-12 DIAGNOSIS — N186 End stage renal disease: Secondary | ICD-10-CM | POA: Insufficient documentation

## 2021-03-12 DIAGNOSIS — B432 Subcutaneous pheomycotic abscess and cyst: Secondary | ICD-10-CM | POA: Diagnosis not present

## 2021-03-12 DIAGNOSIS — E119 Type 2 diabetes mellitus without complications: Secondary | ICD-10-CM | POA: Diagnosis not present

## 2021-03-12 DIAGNOSIS — E1122 Type 2 diabetes mellitus with diabetic chronic kidney disease: Secondary | ICD-10-CM | POA: Diagnosis not present

## 2021-03-12 DIAGNOSIS — G4730 Sleep apnea, unspecified: Secondary | ICD-10-CM | POA: Diagnosis not present

## 2021-03-12 DIAGNOSIS — I120 Hypertensive chronic kidney disease with stage 5 chronic kidney disease or end stage renal disease: Secondary | ICD-10-CM | POA: Insufficient documentation

## 2021-03-12 HISTORY — DX: Heart failure, unspecified: I50.9

## 2021-03-12 LAB — BASIC METABOLIC PANEL
Anion gap: 18 — ABNORMAL HIGH (ref 5–15)
BUN: 97 mg/dL — ABNORMAL HIGH (ref 6–20)
CO2: 23 mmol/L (ref 22–32)
Calcium: 8.5 mg/dL — ABNORMAL LOW (ref 8.9–10.3)
Chloride: 95 mmol/L — ABNORMAL LOW (ref 98–111)
Creatinine, Ser: 16.06 mg/dL — ABNORMAL HIGH (ref 0.61–1.24)
GFR, Estimated: 3 mL/min — ABNORMAL LOW (ref >60)
Glucose, Bld: 102 mg/dL — ABNORMAL HIGH (ref 70–99)
Potassium: 4.5 mmol/L (ref 3.5–5.1)
Sodium: 136 mmol/L (ref 135–145)

## 2021-03-12 LAB — CBC
HCT: 44.3 % (ref 39.0–52.0)
Hemoglobin: 14.2 g/dL (ref 13.0–17.0)
MCH: 31.5 pg (ref 26.0–34.0)
MCHC: 32.1 g/dL (ref 30.0–36.0)
MCV: 98.2 fL (ref 80.0–100.0)
Platelets: 146 10*3/uL — ABNORMAL LOW (ref 150–400)
RBC: 4.51 MIL/uL (ref 4.22–5.81)
RDW: 14.6 % (ref 11.5–15.5)
WBC: 4.2 10*3/uL (ref 4.0–10.5)
nRBC: 0 % (ref 0.0–0.2)

## 2021-03-12 LAB — HEMOGLOBIN A1C
Hgb A1c MFr Bld: 5.6 % (ref 4.8–5.6)
Mean Plasma Glucose: 114.02 mg/dL

## 2021-03-12 LAB — GLUCOSE, CAPILLARY: Glucose-Capillary: 107 mg/dL — ABNORMAL HIGH (ref 70–99)

## 2021-03-12 NOTE — Progress Notes (Signed)
Anesthesia Chart Review   Case: [id number] Date/Time: 03/19/21 0915   Procedure: EXCISION OF SUBCUTANEOUS CYST, POSTERIOR NECK   Anesthesia type: Monitor Anesthesia Care   Pre-op diagnosis: SUBCUTANEOUS CYST POSTERIOR NECK   Location: [facility] ROOM 04 / [facility]   Surgeons: Clovis Riley, MD       DISCUSSION:55 y.o. never smoker with h/o HTN, ESRD on dialysis MWF, DM II, sleep apnea, subcutaneous cyst to posterior neck scheduled for above procedure 03/19/21 with Dr. Romana Juniper.   Anticipate pt can proceed with planned procedure barring acute status change.   VS: BP 123/88   Pulse 64   Temp 36.5 C (Oral)   Resp 18   Ht 5\' 8"  (1.727 m)   Wt 92.3 kg   SpO2 100%   BMI 30.92 kg/m   PROVIDERS: Charolette Forward, MD is PCP   Skeet Latch, MD is Cardiologist last seen  LABS:  dialysis pt, labs DOS (all labs ordered are listed, but only abnormal results are displayed)  Labs Reviewed  BASIC METABOLIC PANEL - Abnormal; Notable for the following components:      Result Value   Chloride 95 (*)    Glucose, Bld 102 (*)    BUN 97 (*)    Creatinine, Ser 16.06 (*)    Calcium 8.5 (*)    GFR, Estimated 3 (*)    Anion gap 18 (*)    All other components within normal limits  CBC - Abnormal; Notable for the following components:   Platelets 146 (*)    All other components within normal limits  GLUCOSE, CAPILLARY - Abnormal; Notable for the following components:   Glucose-Capillary 107 (*)    All other components within normal limits  HEMOGLOBIN A1C     IMAGES:   EKG:   CV: Echo 04/23/2017 Study Conclusions   - Left ventricle: The cavity size was normal. There was mild    concentric hypertrophy. Systolic function was normal. The    estimated ejection fraction was in the range of 55% to 60%. Wall    motion was normal; there were no regional wall motion    abnormalities. Features are consistent with a pseudonormal left    ventricular filling pattern, with concomitant  abnormal relaxation    and increased filling pressure (grade 2 diastolic dysfunction).  - Mitral valve: There was mild regurgitation.  - Left atrium: The atrium was mildly dilated.   Stress Test 04/09/2017 Normal perfusion. Nuclear stress EF: 48%. Visual estimate, LVEF appears greater Recomm echo to confirm LVEF This is a low risk study. Past Medical History:  Diagnosis Date   Anemia    iron infusions in last few months for anemia    Arthritis    ankles    CHF (congestive heart failure) ([place])    Chronic kidney disease    followed by Dr Arty Baumgartner    Diabetes mellitus without complication ([place])    type II With gastric bypass no longer on meds    Gout    History of anemia due to CKD 12/22/2018   Hyperparathyroidism ([place])    Hypertension    Obesity    PVC (premature ventricular contraction) 02/16/2018   Sinus congestion    all the time   Sleep apnea    cpap off and on     Past Surgical History:  Procedure Laterality Date   AV FISTULA PLACEMENT Right 02/18/2019   Procedure: ARTERIOVENOUS (AV) FISTULA CREATION RIGHT ARM;  Surgeon: Marty Heck, MD;  Location: [facility]  OR;  Service: Vascular;  Laterality: Right;   COLONOSCOPY  06/19/2019   GASTRIC ROUX-EN-Y N/A 07/07/2017   Procedure: LAPAROSCOPIC ROUX-EN-Y GASTRIC BYPASS WITH UPPER ENDOSCOPY;  Surgeon: Kieth Brightly, [name], MD;  Location[facility];  Service: General;  Laterality: N/A;   NASAL SEPTUM SURGERY     QUADRICEPS TENDON REPAIR  05/13/2012   Procedure: REPAIR QUADRICEP TENDON;  Surgeon: Augustin Schooling, MD;  Location: [place];  Service: Orthopedics;  Laterality: Right;   UMBILICAL HERNIA REPAIR N/A 07/09/2017   Procedure: LAPAROSCOPIC UMBILICAL HERNIA WITH MESH;  Surgeon: [name], [name], MD;  Location[facility];  Service: General;  Laterality: N/A;   UPPER GASTROINTESTINAL ENDOSCOPY  06/19/2019    MEDICATIONS:  acetaminophen (TYLENOL) 500 MG tablet   aspirin-sod bicarb-citric acid (ALKA-SELTZER) 325 MG TBEF tablet    AURYXIA 1 GM 210 MG(Fe) tablet   B Complex-C-Biotin-E-Min-FA (DIALYVITE 5000) 5 MG TABS   Calcium Carb-Cholecalciferol (CALCIUM + D3 PO)   calcium carbonate (TUMS - DOSED IN MG ELEMENTAL CALCIUM) 500 MG chewable tablet   oxymetazoline (AFRIN) 0.05 % nasal spray   No current facility-administered medications for this encounter.    Konrad Felix Ward, PA-C [facility] [phone]

## 2021-03-19 ENCOUNTER — Ambulatory Visit (HOSPITAL_COMMUNITY)
Admission: RE | Admit: 2021-03-19 | Discharge: 2021-03-19 | Disposition: A | Discharge status: Home / Self Care | Payer: 59 | Source: Ambulatory Visit | Attending: Surgery | Admitting: Surgery

## 2021-03-19 ENCOUNTER — Ambulatory Visit (HOSPITAL_COMMUNITY): Payer: 59 | Admitting: Certified Registered Nurse Anesthetist

## 2021-03-19 ENCOUNTER — Encounter (HOSPITAL_COMMUNITY)
Admission: RE | Disposition: A | Discharge status: Home / Self Care | Payer: Self-pay | Source: Ambulatory Visit | Attending: Surgery

## 2021-03-19 ENCOUNTER — Ambulatory Visit (HOSPITAL_COMMUNITY): Payer: 59 | Admitting: Physician Assistant

## 2021-03-19 ENCOUNTER — Encounter (HOSPITAL_COMMUNITY): Payer: Self-pay | Admitting: Surgery

## 2021-03-19 DIAGNOSIS — N186 End stage renal disease: Secondary | ICD-10-CM | POA: Diagnosis not present

## 2021-03-19 DIAGNOSIS — I120 Hypertensive chronic kidney disease with stage 5 chronic kidney disease or end stage renal disease: Secondary | ICD-10-CM | POA: Diagnosis not present

## 2021-03-19 DIAGNOSIS — D649 Anemia, unspecified: Secondary | ICD-10-CM

## 2021-03-19 DIAGNOSIS — E1122 Type 2 diabetes mellitus with diabetic chronic kidney disease: Secondary | ICD-10-CM | POA: Insufficient documentation

## 2021-03-19 DIAGNOSIS — L720 Epidermal cyst: Secondary | ICD-10-CM | POA: Insufficient documentation

## 2021-03-19 DIAGNOSIS — G4730 Sleep apnea, unspecified: Secondary | ICD-10-CM | POA: Insufficient documentation

## 2021-03-19 DIAGNOSIS — M1990 Unspecified osteoarthritis, unspecified site: Secondary | ICD-10-CM | POA: Diagnosis not present

## 2021-03-19 DIAGNOSIS — Z992 Dependence on renal dialysis: Secondary | ICD-10-CM | POA: Diagnosis not present

## 2021-03-19 DIAGNOSIS — E119 Type 2 diabetes mellitus without complications: Secondary | ICD-10-CM

## 2021-03-19 DIAGNOSIS — I2510 Atherosclerotic heart disease of native coronary artery without angina pectoris: Secondary | ICD-10-CM

## 2021-03-19 HISTORY — PX: CYST EXCISION: SHX5701

## 2021-03-19 LAB — BASIC METABOLIC PANEL
Anion gap: 15 (ref 5–15)
BUN: 84 mg/dL — ABNORMAL HIGH (ref 6–20)
CO2: 24 mmol/L (ref 22–32)
Calcium: 8.7 mg/dL — ABNORMAL LOW (ref 8.9–10.3)
Chloride: 98 mmol/L (ref 98–111)
Creatinine, Ser: 14.23 mg/dL — ABNORMAL HIGH (ref 0.61–1.24)
GFR, Estimated: 4 mL/min — ABNORMAL LOW (ref >60)
Glucose, Bld: 115 mg/dL — ABNORMAL HIGH (ref 70–99)
Potassium: 4.6 mmol/L (ref 3.5–5.1)
Sodium: 137 mmol/L (ref 135–145)

## 2021-03-19 LAB — GLUCOSE, CAPILLARY: Glucose-Capillary: 108 mg/dL — ABNORMAL HIGH (ref 70–99)

## 2021-03-19 LAB — CBC
HCT: 44.1 % (ref 39.0–52.0)
Hemoglobin: 13.9 g/dL (ref 13.0–17.0)
MCH: 30.8 pg (ref 26.0–34.0)
MCHC: 31.5 g/dL (ref 30.0–36.0)
MCV: 97.8 fL (ref 80.0–100.0)
Platelets: 172 10*3/uL (ref 150–400)
RBC: 4.51 MIL/uL (ref 4.22–5.81)
RDW: 14.6 % (ref 11.5–15.5)
WBC: 4.1 10*3/uL (ref 4.0–10.5)
nRBC: 0 % (ref 0.0–0.2)

## 2021-03-19 SURGERY — CYST REMOVAL
Anesthesia: Monitor Anesthesia Care | Site: Neck

## 2021-03-19 MED ORDER — ACETAMINOPHEN 500 MG PO TABS
1000.0000 mg | ORAL_TABLET | ORAL | Status: AC
  Administered 2021-03-19: 1000 mg via ORAL
  Filled 2021-03-19: qty 2

## 2021-03-19 MED ORDER — MIDAZOLAM HCL 2 MG/2ML IJ SOLN
INTRAMUSCULAR | Status: DC | PRN
  Administered 2021-03-19 (×2): 1 mg via INTRAVENOUS

## 2021-03-19 MED ORDER — CHLORHEXIDINE GLUCONATE 4 % EX LIQD: 60.0000 mL | Freq: Once | CUTANEOUS | Status: DC

## 2021-03-19 MED ORDER — CEFAZOLIN SODIUM-DEXTROSE 2-4 GM/100ML-% IV SOLN
2.0000 g | INTRAVENOUS | Status: AC
  Administered 2021-03-19: 2 g via INTRAVENOUS
  Filled 2021-03-19: qty 100

## 2021-03-19 MED ORDER — SODIUM CHLORIDE 0.9% FLUSH: 3.0000 mL | INTRAVENOUS | Status: DC | PRN

## 2021-03-19 MED ORDER — PROPOFOL 500 MG/50ML IV EMUL
INTRAVENOUS | Status: AC
  Filled 2021-03-19: qty 50

## 2021-03-19 MED ORDER — LIDOCAINE 2% (20 MG/ML) 5 ML SYRINGE
INTRAMUSCULAR | Status: DC | PRN
  Administered 2021-03-19: 40 mg via INTRAVENOUS

## 2021-03-19 MED ORDER — FENTANYL CITRATE (PF) 100 MCG/2ML IJ SOLN
INTRAMUSCULAR | Status: DC | PRN
  Administered 2021-03-19 (×2): 50 ug via INTRAVENOUS

## 2021-03-19 MED ORDER — MIDAZOLAM HCL 2 MG/2ML IJ SOLN
INTRAMUSCULAR | Status: AC
  Filled 2021-03-19: qty 2

## 2021-03-19 MED ORDER — ACETAMINOPHEN 325 MG PO TABS: 650.0000 mg | ORAL_TABLET | ORAL | Status: DC | PRN

## 2021-03-19 MED ORDER — BUPIVACAINE-EPINEPHRINE (PF) 0.25% -1:200000 IJ SOLN
INTRAMUSCULAR | Status: AC
  Filled 2021-03-19: qty 30

## 2021-03-19 MED ORDER — LACTATED RINGERS IV SOLN: INTRAVENOUS | Status: DC

## 2021-03-19 MED ORDER — FENTANYL CITRATE PF 50 MCG/ML IJ SOSY: 25.0000 ug | PREFILLED_SYRINGE | INTRAMUSCULAR | Status: DC | PRN

## 2021-03-19 MED ORDER — SODIUM CHLORIDE 0.9 % IV SOLN: 250.0000 mL | INTRAVENOUS | Status: DC | PRN

## 2021-03-19 MED ORDER — ORAL CARE MOUTH RINSE
15.0000 mL | Freq: Once | OROMUCOSAL | Status: AC
  Administered 2021-03-19: 15 mL via OROMUCOSAL

## 2021-03-19 MED ORDER — ACETAMINOPHEN 650 MG RE SUPP
650.0000 mg | RECTAL | Status: DC | PRN
  Filled 2021-03-19: qty 1

## 2021-03-19 MED ORDER — PROPOFOL 10 MG/ML IV BOLUS
INTRAVENOUS | Status: DC | PRN
  Administered 2021-03-19: 20 mg via INTRAVENOUS

## 2021-03-19 MED ORDER — SODIUM CHLORIDE 0.9 % IV SOLN: INTRAVENOUS | Status: DC

## 2021-03-19 MED ORDER — BUPIVACAINE-EPINEPHRINE 0.25% -1:200000 IJ SOLN
INTRAMUSCULAR | Status: DC | PRN
  Administered 2021-03-19: 11 mL

## 2021-03-19 MED ORDER — CHLORHEXIDINE GLUCONATE 0.12 % MT SOLN: 15.0000 mL | Freq: Once | OROMUCOSAL | Status: AC

## 2021-03-19 MED ORDER — 0.9 % SODIUM CHLORIDE (POUR BTL) OPTIME
TOPICAL | Status: DC | PRN
  Administered 2021-03-19: 1000 mL

## 2021-03-19 MED ORDER — FENTANYL CITRATE (PF) 100 MCG/2ML IJ SOLN
INTRAMUSCULAR | Status: AC
  Filled 2021-03-19: qty 2

## 2021-03-19 MED ORDER — SODIUM CHLORIDE 0.9% FLUSH: 3.0000 mL | Freq: Two times a day (BID) | INTRAVENOUS | Status: DC

## 2021-03-19 MED ORDER — OXYCODONE HCL 5 MG PO TABS: 5.0000 mg | ORAL_TABLET | ORAL | Status: DC | PRN

## 2021-03-19 MED ORDER — TRAMADOL HCL 50 MG PO TABS: 50.0000 mg | ORAL_TABLET | Freq: Four times a day (QID) | ORAL | 0 refills | Status: DC | PRN

## 2021-03-19 MED ORDER — PROPOFOL 500 MG/50ML IV EMUL
INTRAVENOUS | Status: DC | PRN
  Administered 2021-03-19: 75 ug/kg/min via INTRAVENOUS

## 2021-03-19 SURGICAL SUPPLY — 30 items
APL PRP STRL LF DISP 70% ISPRP (MISCELLANEOUS) ×1
BAG COUNTER SPONGE SURGICOUNT (BAG) IMPLANT
BAG SPNG CNTER NS LX DISP (BAG)
BLADE SURG SZ10 CARB STEEL (BLADE) ×2 IMPLANT
BNDG GAUZE ELAST 4 BULKY (GAUZE/BANDAGES/DRESSINGS) IMPLANT
CHLORAPREP W/TINT 26 (MISCELLANEOUS) ×2 IMPLANT
CLSR STERI-STRIP ANTIMIC 1/2X4 (GAUZE/BANDAGES/DRESSINGS) ×1 IMPLANT
COVER SURGICAL LIGHT HANDLE (MISCELLANEOUS) ×2 IMPLANT
DECANTER SPIKE VIAL GLASS SM (MISCELLANEOUS) ×2 IMPLANT
DRAIN PENROSE 0.25X18 (DRAIN) IMPLANT
DRAPE LAPAROTOMY TRNSV 102X78 (DRAPES) ×2 IMPLANT
DRSG PAD ABDOMINAL 8X10 ST (GAUZE/BANDAGES/DRESSINGS) IMPLANT
DRSG TEGADERM 4X4.75 (GAUZE/BANDAGES/DRESSINGS) ×1 IMPLANT
ELECT REM PT RETURN 15FT ADLT (MISCELLANEOUS) ×2 IMPLANT
GAUZE 4X4 16PLY ~~LOC~~+RFID DBL (SPONGE) ×2 IMPLANT
GAUZE SPONGE 4X4 12PLY STRL (GAUZE/BANDAGES/DRESSINGS) ×1 IMPLANT
GLOVE SURG ENC MOIS LTX SZ6 (GLOVE) ×2 IMPLANT
GLOVE SURG MICRO LTX SZ6 (GLOVE) ×2 IMPLANT
GLOVE SURG UNDER LTX SZ6.5 (GLOVE) ×2 IMPLANT
GOWN STRL REUS W/TWL LRG LVL3 (GOWN DISPOSABLE) ×2 IMPLANT
GOWN STRL REUS W/TWL XL LVL3 (GOWN DISPOSABLE) ×2 IMPLANT
KIT BASIN OR (CUSTOM PROCEDURE TRAY) ×2 IMPLANT
KIT TURNOVER KIT A (KITS) IMPLANT
MARKER SKIN DUAL TIP RULER LAB (MISCELLANEOUS) ×2 IMPLANT
NEEDLE HYPO 22GX1.5 SAFETY (NEEDLE) IMPLANT
PACK GENERAL/GYN (CUSTOM PROCEDURE TRAY) ×2 IMPLANT
SUT MNCRL AB 4-0 PS2 18 (SUTURE) ×2 IMPLANT
SYR CONTROL 10ML LL (SYRINGE) IMPLANT
TOWEL OR 17X26 10 PK STRL BLUE (TOWEL DISPOSABLE) ×2 IMPLANT
TOWEL OR NON WOVEN STRL DISP B (DISPOSABLE) ×2 IMPLANT

## 2021-03-19 NOTE — Op Note (Signed)
Operative Note  [name]  [id number]  [id number]  03/19/2021   Surgeon: Romana Juniper MD   Procedure performed: Excision of subcutaneous cyst, posterior neck   Preop diagnosis: Posterior neck subcutaneous cyst, 6 x 4 cm Post-op diagnosis/intraop findings: Same   Specimens: Posterior neck cyst Retained items: no  EBL: minimal cc Complications: none   Description of procedure: After obtaining informed consent the patient was taken to the operating room and placed in the left lateral decubitus position on operating room table where MAC was initiated, preoperative antibiotics were administered, SCDs applied, and a formal timeout was performed.  The skin of the posterior neck was prepped and draped in usual sterile fashion.  After infiltration with quarter percent Marcaine with epinephrine, an elliptical incision was made over the area of the cyst and then a combination of blunt and cautery dissection were used to free the cyst wall of its attachments and surrounding soft tissues.  All cyst wall was excised.  The cyst was entered during the procedure with extrusion of malodorous cottage cheese type material consistent with a sebaceous cyst.  Once the specimen was removed the wound was irrigated and hemostasis was ensured with cautery.  The incision was closed with interrupted deep dermal 3-0 Vicryl and running subcuticular 4-0 Monocryl.  A light pressure dressing was applied.  The patient was then awakened, and in the supine position and taken to PACU in stable condition.    All counts were correct at the completion of the case.

## 2021-03-19 NOTE — Discharge Instructions (Signed)
GENERAL SURGERY: POST OP INSTRUCTIONS  EAT Gradually transition to a high fiber diet with a fiber supplement over the next few weeks after discharge.  Start with a pureed / full liquid diet (see below)  WALK Walk an hour a day (cumulative, not all at once).  Control your pain to do that.    CONTROL PAIN Control pain so that you can walk, sleep, tolerate sneezing/coughing, go up/down stairs.  HAVE A BOWEL MOVEMENT DAILY Keep your bowels regular to avoid problems.  OK to try a laxative to override constipation.  OK to use an antidairrheal to slow down diarrhea.  Call if not better after 2 tries  CALL IF YOU HAVE PROBLEMS/CONCERNS Call if you are still struggling despite following these instructions. Call if you have concerns not answered by these instructions    DIET: Follow a light bland diet & liquids the first 24 hours after arrival home, such as soup, liquids, starches, etc.  Be sure to drink plenty of fluids.  Quickly advance to a usual solid diet within a few days.  Avoid fast food or heavy meals as your are more likely to get nauseated or have irregular bowels.  A low-sugar, high-fiber diet for the rest of your life is ideal.   Take your usually prescribed home medications unless otherwise directed. PAIN CONTROL: Pain is best controlled by a usual combination of three different methods TOGETHER: Ice/Heat Over the counter pain medication Prescription pain medication Most patients will experience some swelling and bruising around the incisions.  Ice packs or heating pads (30-60 minutes up to 6 times a day) will help. Use ice for the first few days to help decrease swelling and bruising, then switch to heat to help relax tight/sore spots and speed recovery.  Some people prefer to use ice alone, heat alone, alternating between ice & heat.  Experiment to what works for you.  Swelling and bruising can take several weeks to resolve.   It is helpful to take an over-the-counter pain  medication regularly for the first few weeks.  Choose one of the following that works best for you: Naproxen (Aleve, etc)  Two 220mg  tabs twice a day OR Ibuprofen (Advil, etc) Three 200mg  tabs four times a day (every meal & bedtime) AND Acetaminophen (Tylenol, etc) 500-650mg  four times a day (every meal & bedtime) A  prescription for pain medication (such as oxycodone, hydrocodone, etc) should be given to you upon discharge.  Take your pain medication as prescribed.  If you are having problems/concerns with the prescription medicine (does not control pain, nausea, vomiting, rash, itching, etc), please call us [phone] to see if we need to switch you to a different pain medicine that will work better for you and/or control your side effect better. If you need a refill on your pain medication, please contact your pharmacy.  They will contact our office to request authorization. Prescriptions will not be filled after 5 pm or on week-ends. Avoid getting constipated.  Between the surgery and the pain medications, it is common to experience some constipation.  Increasing fluid intake and taking a fiber supplement (such as Metamucil, Citrucel, FiberCon, MiraLax, etc) 1-2 times a day regularly will usually help prevent this problem from occurring.  A mild laxative (prune juice, Milk of Magnesia, MiraLax, etc) should be taken according to package directions if there are no bowel movements after 48 hours.   Wash / shower every day, starting 2 days after surgery.  You may shower over the  steri strips as they are waterproof.  Continue to shower over incision(s) after the dressing is off. Remove your outer bandage 2 days after surgery; steri strips will peel off after 1-2 weeks.  You may leave the incision open to air.  You may have skin tapes (Steri Strips) covering the incision(s).  Leave them on until one week, then remove.  You may replace a dressing/Band-Aid to cover the incision for comfort if you wish.       ACTIVITIES as tolerated:   You may resume regular (light) daily activities beginning the next day--such as daily self-care, walking, climbing stairs--gradually increasing activities as tolerated.  If you can walk 30 minutes without difficulty, it is safe to try more intense activity such as jogging, treadmill, bicycling, low-impact aerobics, swimming, etc. Save the most intensive and strenuous activity for last such as sit-ups, heavy lifting, contact sports, etc  Refrain from any heavy lifting or straining until you are off narcotics for pain control.   DO NOT PUSH THROUGH PAIN.  Let pain be your guide: If it hurts to do something, don't do it.  Pain is your body warning you to avoid that activity for another week until the pain goes down. You may drive when you are no longer taking prescription pain medication, you can comfortably wear a seatbelt, and you can safely maneuver your car and apply brakes. You may have sexual intercourse when it is comfortable.  FOLLOW UP in our office Please call [facility] at [phone] to set up an appointment to see your surgeon in the office for a follow-up appointment approximately 2-3 weeks after your surgery. Make sure that you call for this appointment the day you arrive home to insure a convenient appointment time. 9. IF YOU HAVE DISABILITY OR FAMILY LEAVE FORMS, BRING THEM TO THE OFFICE FOR PROCESSING.  DO NOT GIVE THEM TO YOUR DOCTOR.   WHEN TO CALL US [phone]: Poor pain control Reactions / problems with new medications (rash/itching, nausea, etc)  Fever over 101.5 F (38.5 C) Worsening swelling or bruising Continued bleeding from incision. Increased pain, redness, or drainage from the incision Difficulty breathing / swallowing   The clinic staff is available to answer your questions during regular business hours (8:30am-5pm).  Please don't hesitate to call and ask to speak to one of our nurses for clinical concerns.   If you have a medical  emergency, go to the nearest emergency room or call 911.  A surgeon from [facility] is always on call at the hospitals   [facility] [address], [address], [place], [place]  [address] ? MAIN: [phone] ? TOLL FREE: [phone] ?  FAX [phone] [email or web address]  

## 2021-03-19 NOTE — Interval H&P Note (Signed)
History and Physical Interval Note:  03/19/2021 8:55 AM  [name]  has presented today for surgery, with the diagnosis of SUBCUTANEOUS CYST POSTERIOR NECK.  The various methods of treatment have been discussed with the patient and family. After consideration of risks, benefits and other options for treatment, the patient has consented to  Procedure(s): EXCISION OF SUBCUTANEOUS CYST, POSTERIOR NECK (N/A) as a surgical intervention.  The patient's history has been reviewed, patient examined, no change in status, stable for surgery.  I have reviewed the patient's chart and labs.  Questions were answered to the patient's satisfaction.     Shanekqua Schaper Rich Brave

## 2021-03-19 NOTE — Anesthesia Preprocedure Evaluation (Addendum)
Anesthesia Evaluation  Patient identified by MRN, date of birth, ID band Patient awake    Reviewed: Allergy & Precautions, NPO status , Patient's Chart, lab work & pertinent test results  Airway Mallampati: I  TM Distance: >3 FB Neck ROM: Full    Dental no notable dental hx. (+) Teeth Intact, Dental Advisory Given   Pulmonary sleep apnea (no CPAP) ,    Pulmonary exam normal breath sounds clear to auscultation       Cardiovascular hypertension, Pt. on medications +CHF  Normal cardiovascular exam Rhythm:Regular Rate:Normal  Echo 04/23/2017 Study Conclusions   - Left ventricle: The cavity size was normal. There was mild  concentric hypertrophy. Systolic function was normal. The  estimated ejection fraction was in the range of 55% to 60%. Wall  motion was normal; there were no regional wall motion  abnormalities. Features are consistent with a pseudonormal left  ventricular filling pattern, with concomitant abnormal relaxation  and increased filling pressure (grade 2 diastolic dysfunction).  - Mitral valve: There was mild regurgitation.  - Left atrium: The atrium was mildly dilated.   Stress Test 04/09/2017 Normal perfusion. Nuclear stress EF: 48%. Visual estimate, LVEF appears greater Recomm echo to confirm LVEF This is a low risk study.   Neuro/Psych negative neurological ROS  negative psych ROS   GI/Hepatic negative GI ROS, Neg liver ROS,   Endo/Other  diabetes, Type 2  Renal/GU ESRF and DialysisRenal disease (diaylsis MWF)  negative genitourinary   Musculoskeletal  (+) Arthritis ,   Abdominal   Peds  Hematology negative hematology ROS (+)   Anesthesia Other Findings   Reproductive/Obstetrics                           Anesthesia Physical Anesthesia Plan  ASA: 3  Anesthesia Plan: MAC   Post-op Pain Management:    Induction: Intravenous  PONV Risk Score and Plan:  1 and Propofol infusion, Treatment may vary due to age or medical condition and Midazolam  Airway Management Planned: Natural Airway  Additional Equipment:   Intra-op Plan:   Post-operative Plan:   Informed Consent: I have reviewed the patients History and Physical, chart, labs and discussed the procedure including the risks, benefits and alternatives for the proposed anesthesia with the patient or authorized representative who has indicated his/her understanding and acceptance.     Dental advisory given  Plan Discussed with: CRNA  Anesthesia Plan Comments:         Anesthesia Quick Evaluation

## 2021-03-19 NOTE — Anesthesia Procedure Notes (Signed)
Procedure Name: MAC Date/Time: 03/19/2021 9:16 AM Performed by: Claudia Desanctis, CRNA Pre-anesthesia Checklist: Patient identified, Emergency Drugs available, Suction available and Patient being monitored Patient Re-evaluated:Patient Re-evaluated prior to induction Oxygen Delivery Method: Simple face mask

## 2021-03-19 NOTE — Transfer of Care (Signed)
Immediate Anesthesia Transfer of Care Note  Patient: [name]  Procedure(s) Performed: EXCISION OF SUBCUTANEOUS CYST, POSTERIOR NECK (Neck)  Patient Location: PACU  Anesthesia Type:mac  Level of Consciousness: awake  Airway & Oxygen Therapy: Patient Spontanous Breathing and Patient connected to face mask  Post-op Assessment: Report given to RN and Post -op Vital signs reviewed and stable  Post vital signs: Reviewed and stable  Last Vitals:  Vitals Value Taken Time  BP 103/72 03/19/21 1000  Temp    Pulse 70 03/19/21 1001  Resp 15 03/19/21 1001  SpO2 100 % 03/19/21 1001  Vitals shown include unvalidated device data.  Last Pain:  Vitals:   03/19/21 0807  TempSrc: Oral  PainSc: 2          Complications: No notable events documented.

## 2021-03-20 ENCOUNTER — Encounter (HOSPITAL_COMMUNITY): Payer: Self-pay | Admitting: Surgery

## 2021-03-20 LAB — SURGICAL PATHOLOGY

## 2021-03-20 NOTE — Anesthesia Postprocedure Evaluation (Signed)
Anesthesia Post Note  Patient: [name]  Procedure(s) Performed: EXCISION OF SUBCUTANEOUS CYST, POSTERIOR NECK (Neck)     Patient location during evaluation: PACU Anesthesia Type: MAC Level of consciousness: awake and alert Pain management: pain level controlled Vital Signs Assessment: post-procedure vital signs reviewed and stable Respiratory status: spontaneous breathing, nonlabored ventilation, respiratory function stable and patient connected to nasal cannula oxygen Cardiovascular status: stable and blood pressure returned to baseline Postop Assessment: no apparent nausea or vomiting Anesthetic complications: no   No notable events documented.  Last Vitals:  Vitals:   03/19/21 1030 03/19/21 1040  BP: (!) 146/96 129/85  Pulse: (!) 57 79  Resp: 13 14  Temp:  (!) 36.1 C  SpO2: 98% 98%    Last Pain:  Vitals:   03/19/21 1040  TempSrc:   PainSc: 0-No pain                 Floraine Buechler L Lyrical Sowle

## 2021-07-08 ENCOUNTER — Encounter: Payer: Self-pay | Admitting: Gastroenterology

## 2021-08-12 ENCOUNTER — Encounter (HOSPITAL_COMMUNITY): Payer: Self-pay

## 2021-09-04 ENCOUNTER — Emergency Department (HOSPITAL_COMMUNITY): Payer: No Typology Code available for payment source

## 2021-09-04 ENCOUNTER — Encounter (HOSPITAL_COMMUNITY): Payer: Self-pay

## 2021-09-04 ENCOUNTER — Emergency Department (HOSPITAL_COMMUNITY)
Admission: EM | Admit: 2021-09-04 | Discharge: 2021-09-04 | Disposition: A | Discharge status: Home / Self Care | Payer: No Typology Code available for payment source | Attending: Emergency Medicine | Admitting: Emergency Medicine

## 2021-09-04 ENCOUNTER — Other Ambulatory Visit: Payer: Self-pay

## 2021-09-04 DIAGNOSIS — Y92008 Other place in unspecified non-institutional (private) residence as the place of occurrence of the external cause: Secondary | ICD-10-CM | POA: Diagnosis not present

## 2021-09-04 DIAGNOSIS — Y9301 Activity, walking, marching and hiking: Secondary | ICD-10-CM | POA: Diagnosis not present

## 2021-09-04 DIAGNOSIS — S42031A Displaced fracture of lateral end of right clavicle, initial encounter for closed fracture: Secondary | ICD-10-CM | POA: Insufficient documentation

## 2021-09-04 DIAGNOSIS — S4991XA Unspecified injury of right shoulder and upper arm, initial encounter: Secondary | ICD-10-CM | POA: Diagnosis present

## 2021-09-04 DIAGNOSIS — W01198A Fall on same level from slipping, tripping and stumbling with subsequent striking against other object, initial encounter: Secondary | ICD-10-CM | POA: Insufficient documentation

## 2021-09-04 MED ORDER — OXYCODONE HCL 5 MG PO TABS: 5.0000 mg | ORAL_TABLET | Freq: Four times a day (QID) | ORAL | 0 refills | Status: AC | PRN

## 2021-09-04 MED ORDER — OXYCODONE-ACETAMINOPHEN 5-325 MG PO TABS
1.0000 | ORAL_TABLET | Freq: Once | ORAL | Status: AC
  Administered 2021-09-04: 1 via ORAL
  Filled 2021-09-04: qty 1

## 2021-09-04 NOTE — ED Provider Notes (Signed)
?[facility] ?Provider Note ? ? ?CSN: [id number] ?Arrival date & time: 09/04/21  0251 ? ?  ? ?History ? ?Chief Complaint  ?Patient presents with  ? Shoulder Injury  ? ? ?[name] is a 56 y.o. male. ? ?The history is provided by the patient.  ?Shoulder Injury ?This is a new problem. The current episode started more than 2 days ago. The problem occurs daily. The problem has been gradually worsening. Pertinent negatives include no chest pain and no headaches. Nothing relieves the symptoms.  ?Patient was walking up his porch 4 days ago when he slipped and fell hitting his right shoulder on the rail.  He did not fall head over heels or hit his head.  He reports focal pain in his right shoulder.  No weakness or numbness is reported.  No chest pain. ? ?Patient with previous history of renal transplant without any issues ?  ? ?Home Medications ?Prior to Admission medications   ?Medication Sig Start Date End Date Taking? Authorizing Provider  ?oxyCODONE (ROXICODONE) 5 MG immediate release tablet Take 1 tablet (5 mg total) by mouth every 6 (six) hours as needed for severe pain. 09/04/21  Yes Ripley Fraise, MD  ?acetaminophen (TYLENOL) 500 MG tablet Take 1,000 mg by mouth every 6 (six) hours as needed (for pain.).    [provider]  ?aspirin-sod bicarb-citric acid (ALKA-SELTZER) 325 MG TBEF tablet Take 325 mg by mouth every 6 (six) hours as needed (indigestion).    [provider]  ?AURYXIA 1 GM 210 MG(Fe) tablet Take 210-630 mg by mouth See admin instructions. Take 630 mg with each meal and 210 mg with each snack 12/28/18   [provider]  ?B Complex-C-Biotin-E-Min-FA (DIALYVITE 5000) 5 MG TABS Take 1 tablet by mouth daily. 05/30/19   [provider]  ?Calcium Carb-Cholecalciferol (CALCIUM + D3 PO) Take 1 tablet by mouth 3 (three) times daily.    [provider]  ?calcium carbonate (TUMS - DOSED IN MG ELEMENTAL CALCIUM) 500 MG chewable tablet  Chew 500 mg by mouth daily as needed for indigestion or heartburn.    [provider]  ?oxymetazoline (AFRIN) 0.05 % nasal spray Place 1 spray into both nostrils 2 (two) times daily as needed for congestion.    [provider]  ?   ? ?Allergies    ?Patient has no known allergies.   ? ?Review of Systems   ?Review of Systems  ?Cardiovascular:  Negative for chest pain.  ?Musculoskeletal:  Positive for arthralgias.  ?Neurological:  Negative for headaches.  ? ?Physical Exam ?Updated Vital Signs ?BP (!) 147/101 (BP Location: Left Arm)   Pulse 70   Temp 98.2 ?F (36.8 ?C) (Oral)   Resp 16   Ht 1.727 m ('5\' 8"'$ )   Wt 93 kg   SpO2 100%   BMI 31.17 kg/m?  ?Physical Exam ?CONSTITUTIONAL: Well developed/well nourished ?HEAD: Normocephalic/atraumatic ?EYES: EOMI ?ENMT: Mucous membranes moist ?NECK: supple no meningeal signs ?SPINE/BACK: No cervical spine tenderness ?Chest-no chest wall tenderness ?NEURO: Pt is awake/alert/appropriate, moves all extremitiesx4.  No facial droop.   ?EXTREMITIES: pulses normal/equal, full ROM ?Distal pulses equal and intact in his extremities.  Tenderness noted to right distal clavicle.  No lacerations.  Mild bruising noted to the right humerus without any tenderness ?Rest of the upper extremity is  nontender ?SKIN: warm, color normal ?PSYCH: no abnormalities of mood noted, alert and oriented to situation ? ?ED Results / Procedures / Treatments   ?Labs ?(  all labs ordered are listed, but only abnormal results are displayed) ?Labs Reviewed - No data to display ? ?EKG ?None ? ?Radiology ?DG Shoulder Right ? ?Result Date: 09/04/2021 ?CLINICAL DATA:  Fall.  Shoulder pain EXAM: RIGHT SHOULDER - 2+ VIEW COMPARISON:  None Available. FINDINGS: There is a comminuted distal right clavicle fracture. Distal fragments are displaced inferiorly. AC and glenohumeral joints appear intact. IMPRESSION: Comminuted, inferiorly displaced distal right clavicle fracture Electronically Signed   By: Rolm Baptise M.D.   On: 09/04/2021 03:17   ? ?Procedures ?Procedures  ? ? ?Medications Ordered in ED ?Medications  ?oxyCODONE-acetaminophen (PERCOCET/ROXICET) 5-325 MG per tablet 1 tablet (1 tablet Oral Given 09/04/21 0339)  ? ? ?ED Course/ Medical Decision Making/ A&P ?  ?                        ?Medical Decision Making ?Amount and/or Complexity of Data Reviewed ?Radiology: ordered. ? ?Risk ?Prescription drug management. ? ? ?Patient presents after slipping about 4 days ago injuring his right shoulder.  He was found to have a distal clavicle fracture.  No other acute orthopedic injury is noted.  I personally visualized the x-ray.  Patient was placed in a sling immobilizer by nursing staff.  He will be referred to orthopedics.  Short course of pain medicine has been provided ? ? ? ? ? ? ? ?Final Clinical Impression(s) / ED Diagnoses ?Final diagnoses:  ?Closed displaced fracture of acromial end of right clavicle, initial encounter  ? ? ?Rx / DC Orders ?ED Discharge Orders   ? ?      Ordered  ?  oxyCODONE (ROXICODONE) 5 MG immediate release tablet  Every 6 hours PRN       ? 09/04/21 0341  ? ?  ?  ? ?  ? ? ?  ?Ripley Fraise, MD ?09/04/21 [phone] ? ?

## 2021-09-04 NOTE — ED Triage Notes (Signed)
Pt complains of right shoulder pain after falling into the rail on his porch Saturday.  ?

## 2022-02-26 DEATH — deceased

## 2023-01-29 ENCOUNTER — Encounter (HOSPITAL_COMMUNITY): Payer: Self-pay | Admitting: *Deleted
